# Patient Record
Sex: Female | Born: 1960 | Race: Black or African American | Hispanic: No | Marital: Married | Smoking: Never smoker
Health system: Southern US, Community
[De-identification: ages and names within clinical notes are randomized; demographics above are authoritative.]

## PROBLEM LIST (undated history)

## (undated) DIAGNOSIS — D702 Other drug-induced agranulocytosis: Secondary | ICD-10-CM

## (undated) DIAGNOSIS — C50919 Malignant neoplasm of unspecified site of unspecified female breast: Secondary | ICD-10-CM

## (undated) DIAGNOSIS — F32A Depression, unspecified: Secondary | ICD-10-CM

## (undated) DIAGNOSIS — J302 Other seasonal allergic rhinitis: Secondary | ICD-10-CM

## (undated) DIAGNOSIS — F419 Anxiety disorder, unspecified: Secondary | ICD-10-CM

## (undated) DIAGNOSIS — Z8669 Personal history of other diseases of the nervous system and sense organs: Secondary | ICD-10-CM

## (undated) DIAGNOSIS — IMO0001 Reserved for inherently not codable concepts without codable children: Secondary | ICD-10-CM

## (undated) DIAGNOSIS — Z923 Personal history of irradiation: Secondary | ICD-10-CM

## (undated) DIAGNOSIS — C801 Malignant (primary) neoplasm, unspecified: Secondary | ICD-10-CM

## (undated) DIAGNOSIS — F329 Major depressive disorder, single episode, unspecified: Secondary | ICD-10-CM

## (undated) DIAGNOSIS — IMO0002 Reserved for concepts with insufficient information to code with codable children: Secondary | ICD-10-CM

## (undated) DIAGNOSIS — D649 Anemia, unspecified: Secondary | ICD-10-CM

## (undated) DIAGNOSIS — Z9221 Personal history of antineoplastic chemotherapy: Secondary | ICD-10-CM

## (undated) DIAGNOSIS — I10 Essential (primary) hypertension: Secondary | ICD-10-CM

## (undated) DIAGNOSIS — G4700 Insomnia, unspecified: Secondary | ICD-10-CM

## (undated) HISTORY — DX: Anemia, unspecified: D64.9

## (undated) HISTORY — DX: Other drug-induced agranulocytosis: D70.2

## (undated) HISTORY — DX: Anxiety disorder, unspecified: F41.9

## (undated) HISTORY — DX: Major depressive disorder, single episode, unspecified: F32.9

## (undated) HISTORY — DX: Malignant neoplasm of unspecified site of unspecified female breast: C50.919

## (undated) HISTORY — DX: Essential (primary) hypertension: I10

## (undated) HISTORY — DX: Reserved for concepts with insufficient information to code with codable children: IMO0002

## (undated) HISTORY — DX: Other seasonal allergic rhinitis: J30.2

## (undated) HISTORY — DX: Reserved for inherently not codable concepts without codable children: IMO0001

## (undated) HISTORY — DX: Personal history of other diseases of the nervous system and sense organs: Z86.69

## (undated) HISTORY — DX: Depression, unspecified: F32.A

## (undated) HISTORY — DX: Insomnia, unspecified: G47.00

---

## 1997-08-16 ENCOUNTER — Inpatient Hospital Stay (HOSPITAL_COMMUNITY): Admission: AD | Admit: 1997-08-16 | Discharge: 1997-08-16 | Payer: Self-pay | Admitting: Obstetrics

## 1997-11-03 ENCOUNTER — Encounter: Admission: RE | Admit: 1997-11-03 | Discharge: 1997-11-03 | Payer: Self-pay | Admitting: Family Medicine

## 1997-11-22 ENCOUNTER — Encounter: Admission: RE | Admit: 1997-11-22 | Discharge: 1997-11-22 | Payer: Self-pay | Admitting: Family Medicine

## 1997-11-29 ENCOUNTER — Encounter: Admission: RE | Admit: 1997-11-29 | Discharge: 1997-11-29 | Payer: Self-pay | Admitting: Family Medicine

## 1997-12-03 ENCOUNTER — Inpatient Hospital Stay (HOSPITAL_COMMUNITY): Admission: AD | Admit: 1997-12-03 | Discharge: 1997-12-04 | Payer: Self-pay | Admitting: Obstetrics

## 1997-12-03 ENCOUNTER — Inpatient Hospital Stay (HOSPITAL_COMMUNITY): Admission: AD | Admit: 1997-12-03 | Discharge: 1997-12-03 | Payer: Self-pay | Admitting: *Deleted

## 1998-01-30 ENCOUNTER — Encounter: Admission: RE | Admit: 1998-01-30 | Discharge: 1998-01-30 | Payer: Self-pay | Admitting: Sports Medicine

## 1998-11-02 ENCOUNTER — Encounter: Admission: RE | Admit: 1998-11-02 | Discharge: 1998-11-02 | Payer: Self-pay | Admitting: Family Medicine

## 1998-11-23 ENCOUNTER — Encounter: Admission: RE | Admit: 1998-11-23 | Discharge: 1998-11-23 | Payer: Self-pay | Admitting: *Deleted

## 1999-02-26 ENCOUNTER — Encounter: Admission: RE | Admit: 1999-02-26 | Discharge: 1999-02-26 | Payer: Self-pay | Admitting: Family Medicine

## 1999-03-15 ENCOUNTER — Encounter: Admission: RE | Admit: 1999-03-15 | Discharge: 1999-03-15 | Payer: Self-pay | Admitting: Family Medicine

## 1999-06-11 ENCOUNTER — Encounter: Admission: RE | Admit: 1999-06-11 | Discharge: 1999-06-11 | Payer: Self-pay | Admitting: Family Medicine

## 2000-01-15 ENCOUNTER — Encounter: Admission: RE | Admit: 2000-01-15 | Discharge: 2000-01-15 | Payer: Self-pay | Admitting: Family Medicine

## 2000-07-03 ENCOUNTER — Encounter: Admission: RE | Admit: 2000-07-03 | Discharge: 2000-07-03 | Payer: Self-pay | Admitting: Family Medicine

## 2000-07-03 ENCOUNTER — Encounter: Admission: RE | Admit: 2000-07-03 | Discharge: 2000-07-03 | Payer: Self-pay | Admitting: *Deleted

## 2000-07-03 ENCOUNTER — Encounter: Payer: Self-pay | Admitting: Family Medicine

## 2000-08-04 ENCOUNTER — Encounter: Admission: RE | Admit: 2000-08-04 | Discharge: 2000-08-04 | Payer: Self-pay | Admitting: Family Medicine

## 2000-09-03 ENCOUNTER — Encounter: Admission: RE | Admit: 2000-09-03 | Discharge: 2000-09-03 | Payer: Self-pay | Admitting: Family Medicine

## 2000-09-15 ENCOUNTER — Encounter: Admission: RE | Admit: 2000-09-15 | Discharge: 2000-09-15 | Payer: Self-pay | Admitting: Family Medicine

## 2000-11-18 ENCOUNTER — Encounter: Admission: RE | Admit: 2000-11-18 | Discharge: 2000-11-18 | Payer: Self-pay | Admitting: Sports Medicine

## 2000-11-18 ENCOUNTER — Encounter: Admission: RE | Admit: 2000-11-18 | Discharge: 2000-11-18 | Payer: Self-pay | Admitting: Family Medicine

## 2000-11-18 ENCOUNTER — Encounter: Payer: Self-pay | Admitting: Sports Medicine

## 2000-11-25 ENCOUNTER — Encounter: Admission: RE | Admit: 2000-11-25 | Discharge: 2000-11-25 | Payer: Self-pay | Admitting: Family Medicine

## 2000-12-02 ENCOUNTER — Encounter: Admission: RE | Admit: 2000-12-02 | Discharge: 2000-12-02 | Payer: Self-pay | Admitting: Family Medicine

## 2000-12-02 ENCOUNTER — Encounter: Payer: Self-pay | Admitting: Family Medicine

## 2000-12-28 ENCOUNTER — Encounter: Admission: RE | Admit: 2000-12-28 | Discharge: 2000-12-28 | Payer: Self-pay | Admitting: Family Medicine

## 2001-05-20 ENCOUNTER — Encounter: Admission: RE | Admit: 2001-05-20 | Discharge: 2001-05-20 | Payer: Self-pay | Admitting: Family Medicine

## 2001-05-28 ENCOUNTER — Encounter: Admission: RE | Admit: 2001-05-28 | Discharge: 2001-05-28 | Payer: Self-pay | Admitting: Family Medicine

## 2001-07-14 ENCOUNTER — Encounter: Admission: RE | Admit: 2001-07-14 | Discharge: 2001-07-14 | Payer: Self-pay | Admitting: Family Medicine

## 2001-09-15 ENCOUNTER — Encounter: Admission: RE | Admit: 2001-09-15 | Discharge: 2001-09-15 | Payer: Self-pay | Admitting: Family Medicine

## 2002-01-05 ENCOUNTER — Encounter: Admission: RE | Admit: 2002-01-05 | Discharge: 2002-01-05 | Payer: Self-pay | Admitting: Family Medicine

## 2002-01-12 ENCOUNTER — Encounter: Admission: RE | Admit: 2002-01-12 | Discharge: 2002-01-12 | Payer: Self-pay | Admitting: Sports Medicine

## 2002-03-24 ENCOUNTER — Encounter: Admission: RE | Admit: 2002-03-24 | Discharge: 2002-03-24 | Payer: Self-pay | Admitting: Family Medicine

## 2002-03-30 ENCOUNTER — Encounter: Admission: RE | Admit: 2002-03-30 | Discharge: 2002-03-30 | Payer: Self-pay | Admitting: Family Medicine

## 2002-06-08 ENCOUNTER — Encounter: Admission: RE | Admit: 2002-06-08 | Discharge: 2002-06-08 | Payer: Self-pay | Admitting: Family Medicine

## 2002-07-13 ENCOUNTER — Encounter: Admission: RE | Admit: 2002-07-13 | Discharge: 2002-07-13 | Payer: Self-pay | Admitting: Family Medicine

## 2002-10-04 ENCOUNTER — Encounter: Admission: RE | Admit: 2002-10-04 | Discharge: 2002-10-04 | Payer: Self-pay | Admitting: Family Medicine

## 2002-12-21 ENCOUNTER — Encounter: Admission: RE | Admit: 2002-12-21 | Discharge: 2002-12-21 | Payer: Self-pay | Admitting: Family Medicine

## 2003-01-05 ENCOUNTER — Encounter: Payer: Self-pay | Admitting: Sports Medicine

## 2003-01-05 ENCOUNTER — Encounter: Admission: RE | Admit: 2003-01-05 | Discharge: 2003-01-05 | Payer: Self-pay | Admitting: Sports Medicine

## 2003-02-13 ENCOUNTER — Encounter: Admission: RE | Admit: 2003-02-13 | Discharge: 2003-02-13 | Payer: Self-pay | Admitting: Family Medicine

## 2003-04-06 ENCOUNTER — Encounter: Admission: RE | Admit: 2003-04-06 | Discharge: 2003-04-06 | Payer: Self-pay | Admitting: Family Medicine

## 2003-09-25 ENCOUNTER — Encounter: Admission: RE | Admit: 2003-09-25 | Discharge: 2003-09-25 | Payer: Self-pay | Admitting: Family Medicine

## 2003-10-02 ENCOUNTER — Encounter: Admission: RE | Admit: 2003-10-02 | Discharge: 2003-10-02 | Payer: Self-pay | Admitting: Family Medicine

## 2004-02-27 ENCOUNTER — Encounter: Admission: RE | Admit: 2004-02-27 | Discharge: 2004-02-27 | Payer: Self-pay | Admitting: Family Medicine

## 2004-05-16 ENCOUNTER — Encounter: Admission: RE | Admit: 2004-05-16 | Discharge: 2004-07-11 | Payer: Self-pay | Admitting: Nurse Practitioner

## 2004-10-24 ENCOUNTER — Ambulatory Visit: Payer: Self-pay | Admitting: Family Medicine

## 2004-11-09 ENCOUNTER — Encounter (INDEPENDENT_AMBULATORY_CARE_PROVIDER_SITE_OTHER): Payer: Self-pay | Admitting: *Deleted

## 2004-11-11 ENCOUNTER — Encounter: Admission: RE | Admit: 2004-11-11 | Discharge: 2004-11-11 | Payer: Self-pay | Admitting: Sports Medicine

## 2005-10-27 ENCOUNTER — Encounter (INDEPENDENT_AMBULATORY_CARE_PROVIDER_SITE_OTHER): Payer: Self-pay | Admitting: Specialist

## 2005-10-27 ENCOUNTER — Ambulatory Visit: Payer: Self-pay | Admitting: Family Medicine

## 2005-11-14 ENCOUNTER — Encounter: Admission: RE | Admit: 2005-11-14 | Discharge: 2005-11-14 | Payer: Self-pay | Admitting: Family Medicine

## 2006-07-22 ENCOUNTER — Ambulatory Visit: Payer: Self-pay | Admitting: Sports Medicine

## 2006-08-21 ENCOUNTER — Ambulatory Visit: Payer: Self-pay | Admitting: Family Medicine

## 2006-09-03 DIAGNOSIS — G43909 Migraine, unspecified, not intractable, without status migrainosus: Secondary | ICD-10-CM | POA: Insufficient documentation

## 2006-09-03 DIAGNOSIS — G4700 Insomnia, unspecified: Secondary | ICD-10-CM | POA: Insufficient documentation

## 2006-09-03 DIAGNOSIS — J309 Allergic rhinitis, unspecified: Secondary | ICD-10-CM | POA: Insufficient documentation

## 2006-09-04 ENCOUNTER — Encounter (INDEPENDENT_AMBULATORY_CARE_PROVIDER_SITE_OTHER): Payer: Self-pay | Admitting: *Deleted

## 2007-02-17 ENCOUNTER — Encounter: Admission: RE | Admit: 2007-02-17 | Discharge: 2007-02-17 | Payer: Self-pay | Admitting: Sports Medicine

## 2007-02-17 ENCOUNTER — Encounter (INDEPENDENT_AMBULATORY_CARE_PROVIDER_SITE_OTHER): Payer: Self-pay | Admitting: Family Medicine

## 2007-02-25 ENCOUNTER — Encounter (INDEPENDENT_AMBULATORY_CARE_PROVIDER_SITE_OTHER): Payer: Self-pay | Admitting: Family Medicine

## 2007-06-07 ENCOUNTER — Encounter (INDEPENDENT_AMBULATORY_CARE_PROVIDER_SITE_OTHER): Payer: Self-pay | Admitting: Family Medicine

## 2007-06-07 ENCOUNTER — Ambulatory Visit: Payer: Self-pay | Admitting: Family Medicine

## 2007-06-11 ENCOUNTER — Encounter (INDEPENDENT_AMBULATORY_CARE_PROVIDER_SITE_OTHER): Payer: Self-pay | Admitting: Family Medicine

## 2007-06-15 DIAGNOSIS — F32A Depression, unspecified: Secondary | ICD-10-CM | POA: Insufficient documentation

## 2007-06-15 DIAGNOSIS — I10 Essential (primary) hypertension: Secondary | ICD-10-CM | POA: Insufficient documentation

## 2007-06-15 DIAGNOSIS — F329 Major depressive disorder, single episode, unspecified: Secondary | ICD-10-CM

## 2007-06-15 DIAGNOSIS — F411 Generalized anxiety disorder: Secondary | ICD-10-CM | POA: Insufficient documentation

## 2007-07-08 HISTORY — PX: BUNIONECTOMY: SHX129

## 2007-07-28 ENCOUNTER — Encounter: Payer: Self-pay | Admitting: *Deleted

## 2008-07-26 ENCOUNTER — Ambulatory Visit: Payer: Self-pay | Admitting: Family Medicine

## 2008-07-26 ENCOUNTER — Encounter (INDEPENDENT_AMBULATORY_CARE_PROVIDER_SITE_OTHER): Payer: Self-pay | Admitting: Family Medicine

## 2008-07-26 DIAGNOSIS — H538 Other visual disturbances: Secondary | ICD-10-CM | POA: Insufficient documentation

## 2008-07-26 LAB — CONVERTED CEMR LAB
BUN: 15 mg/dL (ref 6–23)
Chloride: 100 meq/L (ref 96–112)
Cholesterol: 183 mg/dL (ref 0–200)
Glucose, Bld: 83 mg/dL (ref 70–99)
LDL Cholesterol: 92 mg/dL (ref 0–99)
Potassium: 3.8 meq/L (ref 3.5–5.3)
Sodium: 139 meq/L (ref 135–145)
Total CHOL/HDL Ratio: 2.4
VLDL: 14 mg/dL (ref 0–40)

## 2008-07-27 ENCOUNTER — Encounter (INDEPENDENT_AMBULATORY_CARE_PROVIDER_SITE_OTHER): Payer: Self-pay | Admitting: Family Medicine

## 2008-07-28 ENCOUNTER — Encounter (INDEPENDENT_AMBULATORY_CARE_PROVIDER_SITE_OTHER): Payer: Self-pay | Admitting: Family Medicine

## 2008-08-04 ENCOUNTER — Encounter: Admission: RE | Admit: 2008-08-04 | Discharge: 2008-08-04 | Payer: Self-pay | Admitting: Family Medicine

## 2008-10-27 ENCOUNTER — Telehealth (INDEPENDENT_AMBULATORY_CARE_PROVIDER_SITE_OTHER): Payer: Self-pay | Admitting: Family Medicine

## 2008-12-18 ENCOUNTER — Encounter (INDEPENDENT_AMBULATORY_CARE_PROVIDER_SITE_OTHER): Payer: Self-pay | Admitting: Family Medicine

## 2009-05-18 ENCOUNTER — Telehealth: Payer: Self-pay | Admitting: Family Medicine

## 2009-07-30 ENCOUNTER — Encounter: Payer: Self-pay | Admitting: Family Medicine

## 2009-07-30 ENCOUNTER — Ambulatory Visit: Payer: Self-pay | Admitting: Family Medicine

## 2009-07-30 DIAGNOSIS — R5381 Other malaise: Secondary | ICD-10-CM | POA: Insufficient documentation

## 2009-07-30 DIAGNOSIS — R5383 Other fatigue: Secondary | ICD-10-CM

## 2009-07-30 LAB — CONVERTED CEMR LAB: Pap Smear: NEGATIVE

## 2009-07-31 LAB — CONVERTED CEMR LAB
BUN: 12 mg/dL (ref 6–23)
Chloride: 98 meq/L (ref 96–112)
Cholesterol: 174 mg/dL (ref 0–200)
Creatinine, Ser: 0.77 mg/dL (ref 0.40–1.20)
HDL: 67 mg/dL (ref >39)
Potassium: 3.7 meq/L (ref 3.5–5.3)
Triglycerides: 62 mg/dL (ref <150)
VLDL: 12 mg/dL (ref 0–40)

## 2009-08-02 ENCOUNTER — Encounter: Payer: Self-pay | Admitting: Family Medicine

## 2009-08-02 DIAGNOSIS — E559 Vitamin D deficiency, unspecified: Secondary | ICD-10-CM | POA: Insufficient documentation

## 2009-08-06 ENCOUNTER — Encounter: Admission: RE | Admit: 2009-08-06 | Discharge: 2009-08-06 | Payer: Self-pay | Admitting: Family Medicine

## 2009-09-25 ENCOUNTER — Encounter: Payer: Self-pay | Admitting: Family Medicine

## 2009-09-25 ENCOUNTER — Ambulatory Visit: Payer: Self-pay | Admitting: Family Medicine

## 2009-09-25 LAB — CONVERTED CEMR LAB: Vit D, 25-Hydroxy: 31 ng/mL

## 2009-09-26 ENCOUNTER — Telehealth: Payer: Self-pay | Admitting: *Deleted

## 2010-07-26 ENCOUNTER — Ambulatory Visit: Admission: RE | Admit: 2010-07-26 | Discharge: 2010-07-26 | Payer: Self-pay | Source: Home / Self Care

## 2010-07-26 ENCOUNTER — Encounter: Payer: Self-pay | Admitting: Family Medicine

## 2010-07-27 ENCOUNTER — Other Ambulatory Visit: Payer: Self-pay | Admitting: Family Medicine

## 2010-07-27 DIAGNOSIS — Z0000 Encounter for general adult medical examination without abnormal findings: Secondary | ICD-10-CM

## 2010-07-27 LAB — CONVERTED CEMR LAB
Alkaline Phosphatase: 91 units/L (ref 39–117)
BUN: 12 mg/dL (ref 6–23)
Creatinine, Ser: 0.74 mg/dL (ref 0.40–1.20)
Glucose, Bld: 85 mg/dL (ref 70–99)
HCT: 39.9 % (ref 36.0–46.0)
Hemoglobin: 12.7 g/dL (ref 12.0–15.0)
LDL Cholesterol: 93 mg/dL (ref 0–99)
MCHC: 31.8 g/dL (ref 30.0–36.0)
MCV: 87.9 fL (ref 78.0–100.0)
Potassium: 3.8 meq/L (ref 3.5–5.3)
RBC: 4.54 M/uL (ref 3.87–5.11)
Total Bilirubin: 0.3 mg/dL (ref 0.3–1.2)
Total CHOL/HDL Ratio: 2.9
Total Protein: 6.7 g/dL (ref 6.0–8.3)
VLDL: 13 mg/dL (ref 0–40)
Vit D, 25-Hydroxy: 17 ng/mL — ABNORMAL LOW (ref 30–89)

## 2010-08-08 ENCOUNTER — Ambulatory Visit
Admission: RE | Admit: 2010-08-08 | Discharge: 2010-08-08 | Disposition: A | Discharge status: Home / Self Care | Payer: BC Managed Care – PPO | Source: Ambulatory Visit | Attending: *Deleted | Admitting: *Deleted

## 2010-08-08 DIAGNOSIS — Z0000 Encounter for general adult medical examination without abnormal findings: Secondary | ICD-10-CM
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
## 2010-08-08 NOTE — Progress Notes (Signed)
----   Converted from flag ---- ---- 09/26/2009 10:27 AM, Milinda Antis MD wrote: Please tell pt her Vit D level is 31, this is normal. Continue with the Vitamin D 1000 international Units daily like we discussed ------------------------------ called pt and delivered message. pt voices understanding.

## 2010-08-08 NOTE — Assessment & Plan Note (Signed)
Summary: FU BP/tcb   Vital Signs:  Patient profile:   50 year old female Weight:      150.5 pounds Pulse rate:   60 / minute BP sitting:   110 / 66  (left arm)  Vitals Entered By: Arlyss Repress CMA, (September 25, 2009 9:14 AM) CC: f/up HTN/ Vit D Is Patient Diabetic? No Pain Assessment Patient in pain? no        Primary Care Provider:  Milinda Antis MD  CC:  f/up HTN/ Vit D.  History of Present Illness:   Pt here to follow up on HTN  Last visit had episodes of lightheadness and fatigue- BP with systolic in low 100's, stopped HCTZ, BP today  110/66. Pt states she feels better off the second agent. No HA, no Chest pain, no dizziness  Vit D- Vit D deficient, will complete 50,000 units this sunday  Habits & Providers  Alcohol-Tobacco-Diet     Tobacco Status: never  Current Medications (verified): 1)  Inderal La 120 Mg Cp24 (Propranolol Hcl) .... Take 1 Capsule By Mouth Once A Day 2)  Wellbutrin Xl 150 Mg Tb24 (Bupropion Hcl) .... Take 1 Tablet By Mouth Once A Day 3)  Zyrtec Allergy 10 Mg Tabs (Cetirizine Hcl) .... Take 1 Tablet By Mouth At Bedtime 4)  Ambien 5 Mg  Tabs (Zolpidem Tartrate) .... Take One Tab By Mouth Nightly As Needed For Sleep 5)  Fluticasone Propionate 50 Mcg/act Susp (Fluticasone Propionate) .... 2 Puffs in Each Nostril Daily Disp: One 6)  Alprazolam 1 Mg Tabs (Alprazolam) .... Take 1/2 Tab By Mouth Two Times A Day As Needed Anxiety 7)  Ibuprofen 800 Mg Tabs (Ibuprofen) .Marland Kitchen.. 1 By Mouth Q 8hrs As Needed Pain 8)  Vitamin D (Ergocalciferol) 50000 Unit Caps (Ergocalciferol) .Marland Kitchen.. 1 By Mouth Q Weekly X 8 Weeks 9)  Vitamin D 1000 Unit Tabs (Cholecalciferol) .Marland Kitchen.. 1 By Mouth Daily  Allergies (verified): No Known Drug Allergies  Review of Systems       Per HPI  Physical Exam  General:  Well-developed,well-nourished,in no acute distress; alert,appropriate and cooperative throughout examination  Vital signs noted    Impression & Recommendations:  Problem  # 1:  HYPERTENSION (ICD-401.9) Assessment Improved  Will discontiue HCTZ continue inderal as will help with history of migraines for prophylaxis The following medications were removed from the medication list:    Hydrochlorothiazide 25 Mg Tabs (Hydrochlorothiazide) .Marland Kitchen... Take 1 tablet by mouth once a day Her updated medication list for this problem includes:    Inderal La 120 Mg Cp24 (Propranolol hcl) .Marland Kitchen... Take 1 capsule by mouth once a day  Orders: [facility]- Est Level  3 ([address])  Problem # 2:  VITAMIN D DEFICIENCY (ICD-268.9) Assessment: Unchanged Repeat labs, start Vit D 1000IU starting Monday Orders: Vit D, 25 OH-[facility] ([id number]) [facility]- Est Level  3 ([address])  Complete Medication List: 1)  Inderal La 120 Mg Cp24 (Propranolol hcl) .... Take 1 capsule by mouth once a day 2)  Wellbutrin Xl 150 Mg Tb24 (Bupropion hcl) .... Take 1 tablet by mouth once a day 3)  Zyrtec Allergy 10 Mg Tabs (Cetirizine hcl) .... Take 1 tablet by mouth at bedtime 4)  Ambien 5 Mg Tabs (Zolpidem tartrate) .... Take one tab by mouth nightly as needed for sleep 5)  Fluticasone Propionate 50 Mcg/act Susp (Fluticasone propionate) .... 2 puffs in each nostril daily disp: one 6)  Alprazolam 1 Mg Tabs (Alprazolam) .... Take 1/2 tab by mouth two times a day as  needed anxiety 7)  Ibuprofen 800 Mg Tabs (Ibuprofen) .Marland Kitchen.. 1 by mouth q 8hrs as needed pain 8)  Vitamin D (ergocalciferol) 50000 Unit Caps (Ergocalciferol) .Marland Kitchen.. 1 by mouth q weekly x 8 weeks 9)  Vitamin D 1000 Unit Tabs (Cholecalciferol) .Marland Kitchen.. 1 by mouth daily  Patient Instructions: 1)  Stop taking the HCTZ 2)  Monday start taking Vitamin D 1000IU daily 3)  Next follow-up will be 6 months Prescriptions: VITAMIN D 1000 UNIT TABS (CHOLECALCIFEROL) 1 by mouth daily  #30 x 12   Entered and Authorized by:   Milinda Antis MD   Signed by:   Milinda Antis MD on 09/25/2009   Method used:   Handwritten   RxID:   [id number]

## 2010-08-08 NOTE — Miscellaneous (Signed)
Summary: Vit D prescription  Clinical Lists Changes  Problems: Added new problem of VITAMIN D DEFICIENCY (ICD-268.9) Medications: Added new medication of VITAMIN D (ERGOCALCIFEROL) 50000 UNIT CAPS (ERGOCALCIFEROL) 1 by mouth Q weekly x 8 weeks - Signed Rx of VITAMIN D (ERGOCALCIFEROL) 50000 UNIT CAPS (ERGOCALCIFEROL) 1 by mouth Q weekly x 8 weeks;  #8 x 0;  Signed;  Entered by: Milinda Antis MD;  Authorized by: Milinda Antis MD;  Method used: [facility], [place], [place]  [address], Ph: [phone] or [phone], Fax: [phone]    Prescriptions: VITAMIN D (ERGOCALCIFEROL) 50000 UNIT CAPS (ERGOCALCIFEROL) 1 by mouth Q weekly x 8 weeks  #8 x 0   Entered and Authorized by:   Milinda Antis MD   Signed by:   Milinda Antis MD on 08/02/2009   Method used:   [facility] to        [facility] (retail)       [address]       [place], [place]  [address]       Ph: [phone] or [phone]       Fax: [phone]   RxID:   [id number]    Discussed lab results , and Vit D. Start 50, 000 units q weekly, then 1000IU daily Milinda Antis MD  August 02, 2009 1:30 PM

## 2010-08-16 ENCOUNTER — Ambulatory Visit (INDEPENDENT_AMBULATORY_CARE_PROVIDER_SITE_OTHER): Payer: BC Managed Care – PPO | Admitting: Family Medicine

## 2010-08-16 ENCOUNTER — Encounter: Payer: Self-pay | Admitting: Family Medicine

## 2010-08-16 VITALS — BP 121/77 | HR 73 | Temp 98.5°F | Wt 157.0 lb

## 2010-08-16 DIAGNOSIS — F329 Major depressive disorder, single episode, unspecified: Secondary | ICD-10-CM

## 2010-08-16 DIAGNOSIS — Z23 Encounter for immunization: Secondary | ICD-10-CM

## 2010-08-16 DIAGNOSIS — Z124 Encounter for screening for malignant neoplasm of cervix: Secondary | ICD-10-CM

## 2010-08-16 DIAGNOSIS — E559 Vitamin D deficiency, unspecified: Secondary | ICD-10-CM

## 2010-08-16 DIAGNOSIS — F3289 Other specified depressive episodes: Secondary | ICD-10-CM

## 2010-08-16 MED ORDER — ERGOCALCIFEROL 1.25 MG (50000 UT) PO CAPS: 50000.0000 [IU] | ORAL_CAPSULE | ORAL | Status: DC

## 2010-08-16 MED ORDER — TETANUS-DIPHTH-ACELL PERTUSSIS 5-2-15.5 LF-MCG/0.5 IM SUSP
0.5000 mL | Freq: Once | INTRAMUSCULAR | Status: AC
  Administered 2010-08-16: 0.5 mL via INTRAMUSCULAR

## 2010-08-16 NOTE — Patient Instructions (Signed)
Make follow-up appt in 2 months to follow-up your mood  Start the Vit D 50,000 units -1 pill each week for 8 weeks Then start taking the 1000units- Vitamin D daily Your blood work otherwise was normal- See copy of labs given

## 2010-08-16 NOTE — Progress Notes (Signed)
  Subjective:    Patient ID: Alekhya Gravlin, female    DOB: Aug 03, 1960, 50 y.o.   MRN: [id number]  HPI  PAP Smear- Here for PAP Smear, no abnormal PAP smears, no vaginal discharge or spotting, Has Mammogram last week          Depression- taking Wellbutrin 300mg  XL daily, increased at last visit, feels it has helped with her irritability, no change in crying episodes, but has not had any recently. Appetite okay, sleep unchanged.   Reviewed labs  Review of Systems per ABOVE     Objective:   Physical Exam  Genitourinary: Uterus normal. Cervix exhibits no motion tenderness, no discharge and no friability. Right adnexum displays no mass and no tenderness. Left adnexum displays no mass and no tenderness. No erythema, tenderness or bleeding around the vagina. No vaginal discharge found.   Gen- NAD, alert and oriented, vitals noted Psych- flat affect, but does laugh and smile at appropriate times, no apparent hallucinations, does not appear to be anxious        Assessment & Plan:  Tdap needed, declines flu

## 2010-08-16 NOTE — Assessment & Plan Note (Signed)
PAP Smear done

## 2010-08-16 NOTE — Assessment & Plan Note (Signed)
Improved, 3 weeks since dose change, no red flags, continue Pt declines couseling

## 2010-08-16 NOTE — Assessment & Plan Note (Signed)
Start Vit d replacement, see instructions, 50,000 IU x 8 weeks Encouraged more dairy

## 2010-08-19 ENCOUNTER — Encounter: Payer: Self-pay | Admitting: Family Medicine

## 2010-09-08 ENCOUNTER — Other Ambulatory Visit: Payer: Self-pay | Admitting: Family Medicine

## 2010-09-09 NOTE — Telephone Encounter (Signed)
Refill request

## 2010-10-01 ENCOUNTER — Other Ambulatory Visit: Payer: Self-pay | Admitting: Family Medicine

## 2010-10-01 NOTE — Telephone Encounter (Signed)
Refill request

## 2010-10-25 ENCOUNTER — Other Ambulatory Visit: Payer: Self-pay | Admitting: Family Medicine

## 2010-10-25 DIAGNOSIS — Z0000 Encounter for general adult medical examination without abnormal findings: Secondary | ICD-10-CM

## 2010-12-28 ENCOUNTER — Other Ambulatory Visit: Payer: Self-pay | Admitting: Family Medicine

## 2010-12-29 NOTE — Telephone Encounter (Signed)
Refill request

## 2011-01-23 ENCOUNTER — Other Ambulatory Visit: Payer: Self-pay | Admitting: Family Medicine

## 2011-01-23 NOTE — Telephone Encounter (Signed)
Refill request

## 2011-02-24 ENCOUNTER — Other Ambulatory Visit: Payer: Self-pay | Admitting: Family Medicine

## 2011-02-24 MED ORDER — ZOLPIDEM TARTRATE 5 MG PO TABS: 5.0000 mg | ORAL_TABLET | Freq: Every evening | ORAL | Status: DC | PRN

## 2011-05-20 ENCOUNTER — Other Ambulatory Visit: Payer: Self-pay | Admitting: Family Medicine

## 2011-05-20 MED ORDER — BUPROPION HCL ER (XL) 300 MG PO TB24: 300.0000 mg | ORAL_TABLET | ORAL | Status: DC

## 2011-06-16 ENCOUNTER — Ambulatory Visit (INDEPENDENT_AMBULATORY_CARE_PROVIDER_SITE_OTHER): Payer: BC Managed Care – PPO | Admitting: Family Medicine

## 2011-06-16 ENCOUNTER — Encounter: Payer: Self-pay | Admitting: Family Medicine

## 2011-06-16 DIAGNOSIS — I10 Essential (primary) hypertension: Secondary | ICD-10-CM

## 2011-06-16 DIAGNOSIS — Z0000 Encounter for general adult medical examination without abnormal findings: Secondary | ICD-10-CM

## 2011-06-16 DIAGNOSIS — E663 Overweight: Secondary | ICD-10-CM

## 2011-06-16 LAB — CBC
Hemoglobin: 13.5 g/dL (ref 12.0–15.0)
MCH: 28 pg (ref 26.0–34.0)
MCHC: 31.9 g/dL (ref 30.0–36.0)
MCV: 87.8 fL (ref 78.0–100.0)
Platelets: 313 10*3/uL (ref 150–400)

## 2011-06-16 MED ORDER — PROPRANOLOL HCL ER 120 MG PO CP24: 120.0000 mg | ORAL_CAPSULE | Freq: Every day | ORAL | Status: DC

## 2011-06-17 LAB — COMPREHENSIVE METABOLIC PANEL
AST: 13 U/L (ref 0–37)
Albumin: 4.1 g/dL (ref 3.5–5.2)
Alkaline Phosphatase: 78 U/L (ref 39–117)
Potassium: 4.1 mEq/L (ref 3.5–5.3)
Sodium: 138 mEq/L (ref 135–145)
Total Bilirubin: 0.5 mg/dL (ref 0.3–1.2)
Total Protein: 6.6 g/dL (ref 6.0–8.3)

## 2011-06-17 LAB — LIPID PANEL
Cholesterol: 149 mg/dL (ref 0–200)
VLDL: 13 mg/dL (ref 0–40)

## 2011-06-18 ENCOUNTER — Encounter: Payer: Self-pay | Admitting: Family Medicine

## 2011-06-18 NOTE — Progress Notes (Signed)
SUBJECTIVE:  50 y.o. female for annual routine checkup and medication refill.   -Concerns today include occasional numbness in R arm.  This comes and goes, and is worsened if she lays on that side. No loss of grip strength.  She does have mild neck pain and tightness associated with these symptoms.   Current Outpatient Prescriptions  Medication Sig Dispense Refill  . ALPRAZolam (XANAX) 1 MG tablet take 1/2 tab by mouth two times a day as needed anxiety       . buPROPion (WELLBUTRIN XL) 300 MG 24 hr tablet Take 1 tablet (300 mg total) by mouth every morning. Needs appt. Prior to next refill  30 tablet  1  . cetirizine (ZYRTEC) 10 MG tablet Take 10 mg by mouth at bedtime.        . cholecalciferol (VITAMIN D) 1000 UNIT tablet Take 1,000 Units by mouth daily.        . fluticasone (FLONASE) 50 MCG/ACT nasal spray 2 sprays by Nasal route daily.        Marland Kitchen ibuprofen (ADVIL,MOTRIN) 800 MG tablet TAKE 1 TABLET BY MOUTH EVERY 8 HOURS AS NEEDED FOR PAIN  40 tablet  2  . propranolol (INDERAL LA) 120 MG 24 hr capsule Take 1 capsule (120 mg total) by mouth daily.  93 capsule  1  . zolpidem (AMBIEN) 5 MG tablet Take 1 tablet (5 mg total) by mouth at bedtime as needed.  30 tablet  1   Allergies: Review of patient's allergies indicates no known allergies.  Patient's last menstrual period was 05/17/2011.    ROS:  Feeling well. No dyspnea or chest pain on exertion.  No abdominal pain, change in bowel habits, black or bloody stools.  No urinary tract symptoms. GYN ROS: normal menses, no abnormal bleeding, pelvic pain or discharge, no breast pain or new or enlarging lumps on self exam.  OBJECTIVE:  The patient appears well, alert, oriented x 3, in no distress. BP 125/85  Pulse 74  Ht 5\' 5"  (1.651 m)  Wt 155 lb 14.4 oz (70.716 kg)  BMI 25.94 kg/m2  LMP 05/17/2011 ENT normal.  Neck supple. No adenopathy or thyromegaly. PERLA.  Lungs are clear, good air entry, no wheezes, rhonchi or rales.  Heart with S1 and S2  normal, no murmurs, regular rate and rhythm.  Abdomen soft without tenderness, guarding, mass or organomegaly.  Extremities show no edema, normal peripheral pulses.  Neurological is normal, no focal findings.   Neck: Negative spurling's Full neck range of motion Grip strength and sensation normal in bilateral hands Strength good C4 to T1 distribution No sensory change to C4 to T1 Reflexes normal Negative tinels at cubital, carpal tunnels and guyon's   PELVIC EXAM: Not indicated, last three consecutive paps normal   ASSESSMENT:  well woman No red flags on exam regarding arm numbness.  May be from compression due to her laying on arm vs early cervical radiculopathy. PLAN:  additional lab tests per orders return annually or prn She wishes to just watch arm numbness, not botherng her too bad, will return if worsening

## 2011-06-19 ENCOUNTER — Other Ambulatory Visit: Payer: Self-pay | Admitting: Family Medicine

## 2011-06-19 NOTE — Telephone Encounter (Signed)
Refill request

## 2011-06-26 ENCOUNTER — Other Ambulatory Visit: Payer: Self-pay | Admitting: Family Medicine

## 2011-06-26 MED ORDER — ZOLPIDEM TARTRATE 5 MG PO TABS: 5.0000 mg | ORAL_TABLET | Freq: Every evening | ORAL | Status: DC | PRN

## 2011-07-16 ENCOUNTER — Other Ambulatory Visit: Payer: Self-pay | Admitting: Family Medicine

## 2011-07-16 MED ORDER — BUPROPION HCL ER (XL) 300 MG PO TB24: 300.0000 mg | ORAL_TABLET | ORAL | Status: DC

## 2011-09-04 ENCOUNTER — Other Ambulatory Visit: Payer: Self-pay | Admitting: Family Medicine

## 2011-09-04 DIAGNOSIS — Z1231 Encounter for screening mammogram for malignant neoplasm of breast: Secondary | ICD-10-CM

## 2011-09-28 ENCOUNTER — Other Ambulatory Visit: Payer: Self-pay | Admitting: Family Medicine

## 2011-09-29 ENCOUNTER — Ambulatory Visit
Admission: RE | Admit: 2011-09-29 | Discharge: 2011-09-29 | Disposition: A | Discharge status: Home / Self Care | Payer: BC Managed Care – PPO | Source: Ambulatory Visit | Attending: Family Medicine | Admitting: Family Medicine

## 2011-09-29 DIAGNOSIS — Z1231 Encounter for screening mammogram for malignant neoplasm of breast: Secondary | ICD-10-CM

## 2011-11-03 ENCOUNTER — Other Ambulatory Visit: Payer: Self-pay | Admitting: Family Medicine

## 2011-12-02 ENCOUNTER — Other Ambulatory Visit: Payer: Self-pay | Admitting: *Deleted

## 2011-12-02 MED ORDER — PROPRANOLOL HCL ER 120 MG PO CP24: 120.0000 mg | ORAL_CAPSULE | Freq: Every day | ORAL | Status: DC

## 2012-01-12 ENCOUNTER — Other Ambulatory Visit: Payer: Self-pay | Admitting: Family Medicine

## 2012-01-12 MED ORDER — ZOLPIDEM TARTRATE 5 MG PO TABS: 5.0000 mg | ORAL_TABLET | Freq: Every evening | ORAL | Status: DC | PRN

## 2012-01-22 ENCOUNTER — Telehealth: Payer: Self-pay | Admitting: Family Medicine

## 2012-01-22 NOTE — Telephone Encounter (Signed)
Medical Exam Report to be completed by Ashley Royalty.

## 2012-01-27 NOTE — Telephone Encounter (Signed)
Needs multiple things including vision, hearing, UA, PPD.  Will need appt for this. Forms returned to Terese Door to call patient

## 2012-01-28 NOTE — Telephone Encounter (Signed)
Pt scheduled for 7/29 for provider to complete forms

## 2012-02-02 ENCOUNTER — Ambulatory Visit (INDEPENDENT_AMBULATORY_CARE_PROVIDER_SITE_OTHER): Payer: BC Managed Care – PPO | Admitting: Family Medicine

## 2012-02-02 ENCOUNTER — Encounter: Payer: Self-pay | Admitting: Family Medicine

## 2012-02-02 VITALS — BP 132/88 | HR 58 | Ht 65.0 in | Wt 163.0 lb

## 2012-02-02 DIAGNOSIS — Z111 Encounter for screening for respiratory tuberculosis: Secondary | ICD-10-CM

## 2012-02-02 DIAGNOSIS — Z0000 Encounter for general adult medical examination without abnormal findings: Secondary | ICD-10-CM

## 2012-02-02 LAB — POCT URINALYSIS DIPSTICK
Glucose, UA: NEGATIVE
Leukocytes, UA: NEGATIVE
Protein, UA: NEGATIVE
Spec Grav, UA: 1.01
Urobilinogen, UA: 0.2

## 2012-02-02 LAB — POCT UA - MICROSCOPIC ONLY

## 2012-02-02 NOTE — Progress Notes (Signed)
Patient ID: Ana Young, female   DOB: [birth year], 51 y.o.   MRN: [id number] SUBJECTIVE:  51 y.o. female for annual checkup.  She requires additional testing including UA, PPD,  Vision and hearing screening for a job she is applying for in [employer].   Current Outpatient Prescriptions  Medication Sig Dispense Refill  . buPROPion (WELLBUTRIN XL) 300 MG 24 hr tablet TAKE 1 TABLET (300 MG TOTAL) BY MOUTH EVERY MORNING.  30 tablet  3  . ibuprofen (ADVIL,MOTRIN) 800 MG tablet TAKE 1 TABLET BY MOUTH EVERY 8 HOURS AS NEEDED FOR PAIN  40 tablet  2  . loratadine (CLARITIN) 10 MG tablet Take 10 mg by mouth daily.      . propranolol ER (INDERAL LA) 120 MG 24 hr capsule Take 1 capsule (120 mg total) by mouth daily.  93 capsule  1  . zolpidem (AMBIEN) 5 MG tablet Take 1 tablet (5 mg total) by mouth at bedtime as needed for sleep.  30 tablet  4  . ergocalciferol (VITAMIN D2) 50000 UNITS capsule Take 1 capsule (50,000 Units total) by mouth once a week. x 8 weeks  8 capsule  0   Allergies: Review of patient's allergies indicates no known allergies.  No LMP recorded.  ROS:  Feeling well. No dyspnea or chest pain on exertion.  No abdominal pain, change in bowel habits, black or bloody stools.  No urinary tract symptoms. GYN ROS: normal menses, no abnormal bleeding, pelvic pain or discharge, no breast pain or new or enlarging lumps on self exam. No neurological complaints.  OBJECTIVE:  The patient appears well, alert, oriented x 3, in no distress. BP 132/88  Pulse 58  Ht 5\' 5"  (1.651 m)  Wt 163 lb (73.936 kg)  BMI 27.12 kg/m2 ENT normal.  Neck supple. No adenopathy or thyromegaly. PERLA.  Peripheral vision normal  Lungs: are clear, good air entry, no wheezes, rhonchi or rales. CV: S1 and S2 normal, no murmurs, regular rate and rhythm.  Abdomen: soft without tenderness, guarding, mass or organomegaly.  Extremities: show no edema, normal peripheral pulses.  Neurological: is normal, no focal  findings.  Color testing: Normal   ASSESSMENT:  well woman  PLAN:  additional lab tests per orders return annually or prn  I find no reason she can not participate in training for [employer].  Paperwork completed.  She will return to have PPD read.

## 2012-02-04 ENCOUNTER — Ambulatory Visit (INDEPENDENT_AMBULATORY_CARE_PROVIDER_SITE_OTHER): Payer: BC Managed Care – PPO | Admitting: *Deleted

## 2012-02-04 DIAGNOSIS — IMO0001 Reserved for inherently not codable concepts without codable children: Secondary | ICD-10-CM

## 2012-02-04 DIAGNOSIS — Z111 Encounter for screening for respiratory tuberculosis: Secondary | ICD-10-CM

## 2012-02-04 LAB — TB SKIN TEST
Induration: 0 mm
TB Skin Test: NEGATIVE

## 2012-02-24 ENCOUNTER — Other Ambulatory Visit: Payer: Self-pay | Admitting: Family Medicine

## 2012-05-13 ENCOUNTER — Other Ambulatory Visit: Payer: Self-pay | Admitting: *Deleted

## 2012-05-13 MED ORDER — PROPRANOLOL HCL ER 120 MG PO CP24: 120.0000 mg | ORAL_CAPSULE | Freq: Every day | ORAL | Status: DC

## 2012-05-17 ENCOUNTER — Other Ambulatory Visit: Payer: Self-pay | Admitting: *Deleted

## 2012-05-17 MED ORDER — IBUPROFEN 800 MG PO TABS: 800.0000 mg | ORAL_TABLET | Freq: Three times a day (TID) | ORAL | Status: DC | PRN

## 2012-06-13 ENCOUNTER — Other Ambulatory Visit: Payer: Self-pay | Admitting: Family Medicine

## 2012-07-07 DIAGNOSIS — C801 Malignant (primary) neoplasm, unspecified: Secondary | ICD-10-CM

## 2012-07-07 HISTORY — DX: Malignant (primary) neoplasm, unspecified: C80.1

## 2012-07-20 ENCOUNTER — Other Ambulatory Visit: Payer: Self-pay | Admitting: Family Medicine

## 2012-08-19 ENCOUNTER — Other Ambulatory Visit: Payer: Self-pay | Admitting: Family Medicine

## 2012-10-20 ENCOUNTER — Telehealth: Payer: Self-pay | Admitting: Family Medicine

## 2012-10-20 NOTE — Telephone Encounter (Signed)
Pt's husband dropped off paperwork to be filled out regarding employment.

## 2012-10-21 NOTE — Telephone Encounter (Signed)
Called pt. Advised that Dr.Matthews does have the form, but it will take at least 7 days, can take up to 14 days. We will call her, once it is done. She agreed. Will call her, once we have the paperwork .Ana Young

## 2012-10-21 NOTE — Telephone Encounter (Signed)
Pt is asking about status of her paperwork - pls advise

## 2012-10-26 NOTE — Telephone Encounter (Signed)
Left message at [phone] that forms for employment are completed and ready to be picked up at front desk.  Ileana Ladd

## 2012-10-26 NOTE — Telephone Encounter (Signed)
Patient has called back to check the status of the paperwork.

## 2012-11-25 ENCOUNTER — Other Ambulatory Visit: Payer: Self-pay | Admitting: Family Medicine

## 2012-11-30 ENCOUNTER — Other Ambulatory Visit: Payer: Self-pay | Admitting: Family Medicine

## 2012-12-26 ENCOUNTER — Other Ambulatory Visit: Payer: Self-pay | Admitting: Family Medicine

## 2013-01-03 ENCOUNTER — Other Ambulatory Visit: Payer: Self-pay | Admitting: *Deleted

## 2013-01-03 MED ORDER — ZOLPIDEM TARTRATE 5 MG PO TABS: ORAL_TABLET | ORAL | Status: DC

## 2013-01-03 NOTE — Telephone Encounter (Signed)
No further refills until seen.

## 2013-01-17 ENCOUNTER — Other Ambulatory Visit: Payer: Self-pay

## 2013-01-18 ENCOUNTER — Ambulatory Visit (INDEPENDENT_AMBULATORY_CARE_PROVIDER_SITE_OTHER): Payer: BC Managed Care – PPO | Admitting: Family Medicine

## 2013-01-18 ENCOUNTER — Other Ambulatory Visit: Payer: Self-pay | Admitting: Family Medicine

## 2013-01-18 ENCOUNTER — Encounter: Payer: Self-pay | Admitting: Family Medicine

## 2013-01-18 VITALS — BP 148/92 | HR 60 | Temp 97.9°F | Ht 65.0 in | Wt 151.5 lb

## 2013-01-18 DIAGNOSIS — N6489 Other specified disorders of breast: Secondary | ICD-10-CM

## 2013-01-18 DIAGNOSIS — N63 Unspecified lump in unspecified breast: Secondary | ICD-10-CM

## 2013-01-18 DIAGNOSIS — H538 Other visual disturbances: Secondary | ICD-10-CM

## 2013-01-18 NOTE — Patient Instructions (Addendum)
It was a pleasure to see you today.  I am ordering a diagnostic mammogram of the left breast to evaluate the lump.  I recommend seeing an eye care professional to help [place] understand the cause of the glare in the left eye.  Your vision today was fine (20/20 in both eyes).

## 2013-01-18 NOTE — Progress Notes (Signed)
Subjective:     Patient ID: Ana Young, female   DOB: 19-Dec-1960, 52 y.o.   MRN: [id number]  HPI This is an African American 52 yo woman with hx of HTN and fam hx of breast cancer presenting to the clinic today for a new left breast lump and left eye irritation.  Pt found a lump in the LUQ of her left breast this past Saturday (January 15 2013) and wanted to get it examined. It has not been bothering her at all, nor does it affect any of her daily activities. She is able to move the mass slightly. She denies pain, nipple discharge, or any recent illness or sick contacts. She has a significant fam hx for breast cancer only in her maternal grandmother. She had a normal screening mammogram last Spring. Her LMP was last month, however, they have always been irregular. Pt does not believe that there is a possibility that she is pregnant.    Ana Young also complains of sharp left eye pain that she describes as "someone sticking a pin in your eye." She localizes it to the medial part of her eye. This began yesterday, occurring two or three times and lasting only for seconds. She has tried OTC eye drops (unsure of which but stated that they may be for allergies), which provided no relief and actually caused her eyes to burn. Currently, she states that her eyes feel tight. She denies photophobia, sensation of foreign object in eye, HA, N/V, seeing lights or objects in her field of vision. She does endorse a type of glare that she is experiencing in her left eye, but otherwise has had no change in vision. No pain with eye movement.  Review of Systems As per HPI  Attending Note;  Patient seen and examined by me together with Donnal Moat, [facility], I have reviewed and agree with his documentation in this note.  Briefly, Ana Young comes in today for an acute visit after noticing a mass along the lateral aspect of her left breast over this past weekend.  She reports that it is tender when she presses on it, but  otherwise it is asymptomatic.  She receives annual mammograms and the (negative) results of her most recent screening mammography are reviewed with her today.  She denies nipple discharge or blood, no overlying skin changes on either breast, no masses along axillae.  No fevers or chills, no weight changes or sweats.  Her LMP was about 1 month ago.  Family Hx significant for maternal grandmother with breast cancer.  Patient has no sisters.   Patient also reports that she has had some sharp momentary pain in the left eye, without associated headache.  She has noticed a glare in the L eye, no associated lights or flashes of light.  No nausea or vomiting.  No amaurosis fugax. No jaw claudication.  Does not feel like a foreign body in eye. No diplopia or increase in lacrimation.  She has her eyes checked by an eye care professional on [facility] close to [facility]. JB    Objective:   Physical Exam General: NAD. A&Ox3. Speaks in full sentences and answers questions appropriately.  Well appearing, no apparent distress. JB  Head & Neck: NCAT. No cervical lymphadenopathy. Neck supple, no cervical adenopathy. TMs clear bilaterally. No frontal or maxillary sinus tenderness. Snellen visual acuity 20/20 uncorrected OS, OD, and OU.  PERRLA.  EOMI.  No conjunctival injection bilat. Funduscopic exam with crisp disc visualized in OD; opacity that  obscures visualization of fundus on the left. JB  Eye: Visual acuity on Snellen Chart 20/20 uncorrected. No scleral icterus or conjunctival pallor. Some scleral inflammation L>R. PERRL. On fundoscopic exam, no retinopathy or papilledema noted. However, fundus on left was difficult to visualize.  Breast: No nipple discharge, no pain on palpation bilaterally. Painless, non-mobile, firm mass noted in LUQ of left breast. No axillary lymphadenopathy.  No breast asymmetry.  Firm, palpable subcutaneous mass on L breast, Upper-outer quadrant, measuring >1cm diameter.  No nipple discharge or  skin changes on either breast.  No axillary or supraclavicular adenopathy noted on bilateral exam.  JB  Problem list: 1. Breast mass 2. Eye irritation/pain    Assessment:     This is a 52 yo AA woman with HTN and fam hx of breast cancer with a left breast mass concerning for neoplasm and left eye pain that may be suggestive of possible cataracts or glaucoma.    Plan:     1. Breast mass - Given family hx of breast cancer and painless, non-mobile presentation of the left breast mass further evaluation is necessary. Pt was scheduled for diagnostic mammography and will be called with the results when they return. Patient with finding of L breast mass on SBE, confirmed on CBE today.  For diagnostic mammography and possibly [place] at the discretion of the radiologist at the [facility].  Patient is informed that she will be called once the results of the study becomes available. JB   2. Eye pain - "Glare" and acute onset sharp pain lasting seconds w/o other symptoms or changes in vision is nonspecific but concerning for cataracts or glaucoma. Particularly, pt's race and hx HTN puts her at risk for glaucoma. It was recommended to the pt to see an eye care professional for further evaluation.  No acute vision loss; opacity in L eye raises cataract on list of differential dx. For exam by her eyecare professional, she will schedule. JB

## 2013-01-19 NOTE — Assessment & Plan Note (Signed)
No acute vision loss; opacity in L eye raises cataract on list of differential dx. For exam by her eyecare professional, she will schedule. JB

## 2013-01-19 NOTE — Assessment & Plan Note (Signed)
Patient with finding of L breast mass on SBE, confirmed on CBE today.  For diagnostic mammography and possibly [place] at the discretion of the radiologist at the [facility].  Patient is informed that she will be called once the results of the study becomes available. JB

## 2013-01-27 ENCOUNTER — Ambulatory Visit
Admission: RE | Admit: 2013-01-27 | Discharge: 2013-01-27 | Disposition: A | Discharge status: Home / Self Care | Payer: BC Managed Care – PPO | Source: Ambulatory Visit | Attending: Family Medicine | Admitting: Family Medicine

## 2013-01-27 ENCOUNTER — Other Ambulatory Visit: Payer: Self-pay | Admitting: Family Medicine

## 2013-01-27 DIAGNOSIS — N63 Unspecified lump in unspecified breast: Secondary | ICD-10-CM

## 2013-01-28 ENCOUNTER — Telehealth: Payer: Self-pay | Admitting: Family Medicine

## 2013-01-28 NOTE — Telephone Encounter (Signed)
I called patient on her mobile phone, to discuss the findings of her recent diagnostic mammogram and [place].  She is aware of the results and has [facility] for [facility]th at 1pm. Paula Compton, MD

## 2013-02-21 ENCOUNTER — Other Ambulatory Visit: Payer: BC Managed Care – PPO

## 2013-02-22 ENCOUNTER — Other Ambulatory Visit: Payer: Self-pay | Admitting: Family Medicine

## 2013-02-22 ENCOUNTER — Ambulatory Visit
Admission: RE | Admit: 2013-02-22 | Discharge: 2013-02-22 | Disposition: A | Discharge status: Home / Self Care | Payer: BC Managed Care – PPO | Source: Ambulatory Visit | Attending: Family Medicine | Admitting: Family Medicine

## 2013-02-22 DIAGNOSIS — N63 Unspecified lump in unspecified breast: Secondary | ICD-10-CM

## 2013-02-23 ENCOUNTER — Ambulatory Visit
Admission: RE | Admit: 2013-02-23 | Discharge: 2013-02-23 | Disposition: A | Discharge status: Home / Self Care | Payer: BC Managed Care – PPO | Source: Ambulatory Visit | Attending: Family Medicine | Admitting: Family Medicine

## 2013-02-23 ENCOUNTER — Other Ambulatory Visit: Payer: Self-pay | Admitting: Family Medicine

## 2013-02-23 ENCOUNTER — Telehealth: Payer: Self-pay | Admitting: *Deleted

## 2013-02-23 DIAGNOSIS — N63 Unspecified lump in unspecified breast: Secondary | ICD-10-CM

## 2013-02-23 DIAGNOSIS — C50919 Malignant neoplasm of unspecified site of unspecified female breast: Secondary | ICD-10-CM

## 2013-02-23 NOTE — Telephone Encounter (Signed)
Olegario Messier calling from [facility].  Patient recently dx'd with left breast CA. Order has been put in for breast MRI and appt scheduled with [facility] for 02/28/13.  [facility] will fax orders to our office for signature.  Gaylene Brooks, RN

## 2013-02-23 NOTE — Telephone Encounter (Signed)
Reviewed recent clinic notes from Dr. Mauricio Po. I have not yet met Ms [name] but appreciate the efforts of other providers in diagnosing and helping arrange further work-up and treatment for her. Orders form signed and given to. Ander Slade, RN for faxing back to [facility]. Thank you! --CMS

## 2013-02-24 ENCOUNTER — Telehealth: Payer: Self-pay | Admitting: *Deleted

## 2013-02-24 ENCOUNTER — Other Ambulatory Visit: Payer: Self-pay | Admitting: *Deleted

## 2013-02-24 DIAGNOSIS — C50412 Malignant neoplasm of upper-outer quadrant of left female breast: Secondary | ICD-10-CM

## 2013-02-24 NOTE — Telephone Encounter (Signed)
Called and spoke with patient and confirmed [facility] appt. For 03/02/13 at 0800.  Instructions and contact information given.

## 2013-02-28 ENCOUNTER — Ambulatory Visit
Admission: RE | Admit: 2013-02-28 | Discharge: 2013-02-28 | Disposition: A | Discharge status: Home / Self Care | Payer: BC Managed Care – PPO | Source: Ambulatory Visit | Attending: Family Medicine | Admitting: Family Medicine

## 2013-02-28 DIAGNOSIS — C50919 Malignant neoplasm of unspecified site of unspecified female breast: Secondary | ICD-10-CM

## 2013-02-28 MED ORDER — GADOBENATE DIMEGLUMINE 529 MG/ML IV SOLN
14.0000 mL | Freq: Once | INTRAVENOUS | Status: AC | PRN
  Administered 2013-02-28: 14 mL via INTRAVENOUS

## 2013-03-01 ENCOUNTER — Other Ambulatory Visit: Payer: Self-pay | Admitting: Family Medicine

## 2013-03-01 DIAGNOSIS — R928 Other abnormal and inconclusive findings on diagnostic imaging of breast: Secondary | ICD-10-CM

## 2013-03-02 ENCOUNTER — Encounter: Payer: Self-pay | Admitting: *Deleted

## 2013-03-02 ENCOUNTER — Ambulatory Visit
Admission: RE | Admit: 2013-03-02 | Discharge: 2013-03-02 | Disposition: A | Discharge status: Home / Self Care | Payer: BC Managed Care – PPO | Source: Ambulatory Visit | Attending: Radiation Oncology | Admitting: Radiation Oncology

## 2013-03-02 ENCOUNTER — Encounter: Payer: Self-pay | Admitting: Family Medicine

## 2013-03-02 ENCOUNTER — Other Ambulatory Visit: Payer: Self-pay | Admitting: Family Medicine

## 2013-03-02 ENCOUNTER — Other Ambulatory Visit: Payer: Self-pay | Admitting: Physician Assistant

## 2013-03-02 ENCOUNTER — Ambulatory Visit (HOSPITAL_BASED_OUTPATIENT_CLINIC_OR_DEPARTMENT_OTHER): Payer: BC Managed Care – PPO | Admitting: Oncology

## 2013-03-02 ENCOUNTER — Encounter: Payer: Self-pay | Admitting: Oncology

## 2013-03-02 ENCOUNTER — Other Ambulatory Visit (HOSPITAL_BASED_OUTPATIENT_CLINIC_OR_DEPARTMENT_OTHER): Payer: BC Managed Care – PPO | Admitting: Lab

## 2013-03-02 ENCOUNTER — Ambulatory Visit: Payer: BC Managed Care – PPO | Attending: General Surgery | Admitting: Physical Therapy

## 2013-03-02 ENCOUNTER — Telehealth: Payer: Self-pay | Admitting: Oncology

## 2013-03-02 ENCOUNTER — Ambulatory Visit: Payer: BC Managed Care – PPO

## 2013-03-02 ENCOUNTER — Ambulatory Visit (HOSPITAL_BASED_OUTPATIENT_CLINIC_OR_DEPARTMENT_OTHER): Payer: BC Managed Care – PPO | Admitting: General Surgery

## 2013-03-02 VITALS — BP 146/90 | HR 79 | Temp 98.4°F | Resp 20 | Ht 65.0 in | Wt 148.2 lb

## 2013-03-02 DIAGNOSIS — C50419 Malignant neoplasm of upper-outer quadrant of unspecified female breast: Secondary | ICD-10-CM

## 2013-03-02 DIAGNOSIS — R928 Other abnormal and inconclusive findings on diagnostic imaging of breast: Secondary | ICD-10-CM

## 2013-03-02 DIAGNOSIS — C50412 Malignant neoplasm of upper-outer quadrant of left female breast: Secondary | ICD-10-CM

## 2013-03-02 DIAGNOSIS — IMO0001 Reserved for inherently not codable concepts without codable children: Secondary | ICD-10-CM | POA: Insufficient documentation

## 2013-03-02 DIAGNOSIS — R293 Abnormal posture: Secondary | ICD-10-CM | POA: Insufficient documentation

## 2013-03-02 DIAGNOSIS — C50919 Malignant neoplasm of unspecified site of unspecified female breast: Secondary | ICD-10-CM | POA: Insufficient documentation

## 2013-03-02 LAB — COMPREHENSIVE METABOLIC PANEL (CC13)
Alkaline Phosphatase: 103 U/L (ref 40–150)
BUN: 9.2 mg/dL (ref 7.0–26.0)
Creatinine: 0.8 mg/dL (ref 0.6–1.1)
Glucose: 93 mg/dl (ref 70–140)
Total Bilirubin: 0.36 mg/dL (ref 0.20–1.20)

## 2013-03-02 LAB — CBC WITH DIFFERENTIAL/PLATELET
Basophils Absolute: 0 10*3/uL (ref 0.0–0.1)
Eosinophils Absolute: 0 10*3/uL (ref 0.0–0.5)
LYMPH%: 31.8 % (ref 14.0–49.7)
MCV: 87.2 fL (ref 79.5–101.0)
MONO%: 6.5 % (ref 0.0–14.0)
NEUT#: 2.8 10*3/uL (ref 1.5–6.5)
Platelets: 325 10*3/uL (ref 145–400)
RBC: 4.76 10*6/uL (ref 3.70–5.45)

## 2013-03-02 NOTE — Progress Notes (Signed)
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 female  who presented with a palpable left breast mass. Mammogram showed a 1.4 cm mass with a adjacent nodule measuring 1.2 cm. MRI confirmed these 2 masses as well as additional nodule measuring 4 mm with a total area of involvement of 4 cm. 2 nodules were noted on MRI on the right as well and biopsy was recommended. 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 She is done well since her biopsies. She has minimal pain. She has discussed genetic testing and neoadjuvant chemotherapy with Dr. Daneil Dolin not and Dr. Donell Beers. She has no family history of breast cancer. She had her first menses at 52 and is still having regular periods. She is GX P2 with her first live birth at 54.Marland Kitchen  PREVIOUS RADIATION THERAPY: No  PAST MEDICAL HISTORY:  has a past medical history of Depression; Vitamin D deficiency; Anxiety; Seasonal allergies; HTN (hypertension); Insomnia; and History of migraines.    PAST SURGICAL HISTORY: Past Surgical History  Procedure Laterality Date  . Bunionectomy Left 2009    FAMILY HISTORY:  Family History  Problem Relation Age of Onset  . Heart attack Brother     SOCIAL HISTORY:  History  Substance Use Topics  . Smoking status: Never Smoker   . Smokeless tobacco: Not on file  . Alcohol Use: No    ALLERGIES: Review of patient's allergies indicates no known allergies.  MEDICATIONS:  Current Outpatient Prescriptions  Medication Sig Dispense Refill  . ALPRAZolam (XANAX) 1 MG tablet Take 1 mg by mouth 2 (two) times daily as needed for sleep (1/2 tablet bid as needed).      Marland Kitchen  buPROPion (WELLBUTRIN XL) 300 MG 24 hr tablet TAKE 1 TABLET (300 MG TOTAL) BY MOUTH EVERY MORNING.  30 tablet  3  . ergocalciferol (VITAMIN D2) 50000 UNITS capsule Take 1 capsule (50,000 Units total) by mouth once a week. x 8 weeks  8 capsule  0  . ibuprofen (ADVIL,MOTRIN) 800 MG tablet TAKE 1 TABLET (800 MG TOTAL) BY MOUTH EVERY 8 (EIGHT) HOURS AS NEEDED FOR PAIN.  30 tablet  2  . propranolol ER (INDERAL LA) 120 MG 24 hr capsule TAKE 1 CAPSULE (120 MG TOTAL) BY MOUTH DAILY.  93 capsule  1  . zolpidem (AMBIEN) 5 MG tablet TAKE 1 TABLET BY MOUTH AT BEDTIME AS NEEDED FOR SLEEP  30 tablet  0   No current facility-administered medications for this encounter.    REVIEW OF SYSTEMS:  A 15 point review of systems is documented in the electronic medical record. This was obtained by the nursing staff. However, I reviewed this with the patient to discuss relevant findings and make appropriate changes.  Pertinent items are noted in HPI.  PHYSICAL EXAM: There were no vitals filed for this visit.. . He is a pleasant female in no distress sitting comfortably examining table. She has no palpable cervical or subclavicular adenopathy. She has small breasts bilaterally. She has a palpable mass in the upper outer quadrant of the left breast. She has no lymphedema bilaterally. Her strength is 5 out of 5 in her bilateral upper and  lower extremities.  LABORATORY DATA:  Lab Results  Component Value Date   WBC 4.7 03/02/2013   HGB 13.4 03/02/2013   HCT 41.5 03/02/2013   MCV 87.2 03/02/2013   PLT 325 03/02/2013   Lab Results  Component Value Date   NA 142 03/02/2013   K 3.4* 03/02/2013   CL 110 06/16/2011   CO2 25 03/02/2013   Lab Results  Component Value Date   ALT 13 03/02/2013   AST 14 03/02/2013   ALKPHOS 103 03/02/2013   BILITOT 0.36 03/02/2013     RADIOGRAPHY: Mr Breast Bilateral W Wo Contrast  03/01/2013   *RADIOLOGY REPORT*  Clinical Data:Recently diagnosed left breast invasive mammary carcinoma.   Preoperative evaluation.  BUN and creatinine were obtained on site at [facility] at [address] Results:  BUN 9 mg/dL,  Creatinine 0.9 mg/dL.  BILATERAL BREAST MRI WITH AND WITHOUT CONTRAST  Technique: Multiplanar, multisequence MR images of both breasts were obtained prior to and following the intravenous administration of 14ml of Multihance.  THREE-DIMENSIONAL MR IMAGE RENDERING ON INDEPENDENT WORKSTATION: Three-dimensional MR images were rendered by post-processing  of the original MR data on an independent DynaCad workstation. The three-dimensional MR images were interpreted, and findings are reported in the following complete MRI report for this study.  Comparison:  Recent imaging examinations.  FINDINGS:  Breast composition:  c:  Heterogeneous fibroglandular tissue  Background parenchymal enhancement: Moderate.  Right breast:  Within the right breast there is an area of irregular enhancement located within the superior central third of the breast which measures 7 x 6 x 9 mm in size.  This is associated with plateau enhancement.  This is worrisome for possible DCIS. Located 1.5 cm medial to this area of enhancement is a similar 9 x 6 x 5 mm irregular area of clumped linear enhancement also worrisome for possible DCIS.  MR guided biopsy of these areas is recommended.  There are several small, oval, circumscribed enhancing nodules located laterally within the upper-outer quadrant of the right breast which correspond to mammographically stable intramammary lymph nodes.  Left breast:  There is an irregular enhancing mass located within the upper-outer quadrant of the left breast (lower axillary region) with associated clip artifact. Immediately adjacent to this mass is a contiguous irregular enhancing mass with clip artifact related to the patient's recent clip placement.  In aggregate, the area of enhancement measures 2.9 x 2.1 x 2.0 cm in size.  Located 5 mm lateral to this mass is an additional  10 x 6 x 9 mm irregular enhancing mass worrisome for an additional satellite nodule or low axillary metastatic lymph node.  There are no additional worrisome enhancing foci within the left breast.  Lymph nodes:  No additional abnormal appearing lymph nodes and no evidence for internal mammary adenopathy.  Ancillary findings:  No additional findings  IMPRESSION:  1.  2.9 cm irregular enhancing mass located within the lower axillary portion of the left breast corresponding to the recently biopsied mass and adjacent satellite nodule (which contains clip artifact). 2.  1 cm irregular enhancing mass located 5 mm lateral to the known carcinoma worrisome for an additional satellite nodule versus a metastatic intramammary lymph node. 3.  9 x 7 x 6 mm  irregular enhancing focus with an additional 9 x 6 x 5 mm area of clumped linear enhancement located medial to this as discussed above.  Both of these areas are worrisome for possible DCIS and MR guided biopsy is recommended.  RECOMMENDATION: Right breast MR guided biopsies as discussed above.  BI-RADS CATEGORY 4:  Suspicious abnormality - biopsy should be considered.   Original Report Authenticated By: Rolla Plate, M.D.   Mm Digital Diagnostic Unilat L  02/22/2013   *RADIOLOGY REPORT*  Clinical Data:  Ultrasound-guided core needle biopsy of a microlobulated 1.4 cm mass at 1 o'clock 9 cm from the left nipple with clip placement.  A 4 mm satellite lesion was clipped but not biopsied.  DIGITAL DIAGNOSTIC LEFT MAMMOGRAM  Comparison:  Previous exams.  Findings:  Films are performed following ultrasound guided biopsy of a 1.4 cm mass at 1 o'clock 9 cm from the left nipple.  The ribbon clip is placed within this mass.  A TriMark clip is placed within the 4 mm adjacent satellite nodule which was not biopsied.  IMPRESSION: Appropriate clip placement as described above.   Original Report Authenticated By: Cain Saupe, M.D.   Mm Radiologist Eval And Mgmt  02/23/2013    *RADIOLOGY REPORT*  ESTABLISHED PATIENT OFFICE VISIT - LEVEL II [id number])  Chief Complaint:  52 year old female returning for results of ultrasound-guided left breast biopsy performed on 02/22/2013. The patient has no complaints with her biopsy site.  History:  52 year old female with 1.4 cm mass in the upper outer left breast.  The patient underwent ultrasound guided left breast biopsy on 02/22/2013.  Exam:  The biopsy site was clean and dry.  There is no evidence of hematoma or infection.  Pathology: INVASIVE MAMMARY CARCINOMA. Histology correlates with imaging findings.  Assessment and Plan:  Surgery/oncology consultation. An appointment at the [facility] has been scheduled for 03/02/2013. Bilateral breast MRI, which has been scheduled for 02/28/2013. The patient was made aware of these appointments.   Original Report Authenticated By: Harmon Pier, M.D.   [place] Lt Breast Bx W Loc Dev 1st Lesion Img Bx Spec US Guide  02/22/2013   *RADIOLOGY REPORT*  Clinical Data:  1.4 cm microlobulated mass at 1 o'clock 9 cm from the left nipple.  ULTRASOUND GUIDED VACUUM ASSISTED CORE BIOPSY OF THE LEFT BREAST  Sonography prior to the biopsy demonstrated a 4 mm microlobulated mass at 1 o'clock 9 cm from the left nipple, 4 mm from the previously noted mass.  Sonography of the left axilla demonstrates a lymph node with prominent but not definitely abnormal cortex.  The patient and I discussed the procedure of ultrasound-guided biopsy, including benefits and alternatives.  We discussed the high likelihood of a successful procedure. We discussed the risks of the procedure including infection, bleeding, tissue injury, clip migration, and inadequate sampling.  Written informed consent was given. Appropriate time-out was performed.  Using sterile technique, 2% lidocaine, ultrasound guidance, and a 13 gauge vacuum assisted needle, biopsy was performed of the 1.4 cm mass at 1 o'clock 9 cm from the left nipple  using a lateromedial approach.  At the conclusion of the procedure, a ribbon tissue marker clip was deployed into the biopsy cavity. A TriMark clip was placed within the satellite nodule 4 mm from the biopsied mass. Follow-up 2-view mammogram was performed and dictated separately.  IMPRESSION: Ultrasound-guided biopsy of a 1.4 cm mass at 1 o'clock 9 cm from the left nipple.  Ribbon clip placement within this mass.  TriMark clip placed within 4 mm satellite nodule.  No apparent complications.   Original Report Authenticated By: Cain Saupe, M.D.      IMPRESSION: T1cN0 triple negative left breast cancer with biopsies pending.  PLAN: I discussed with Ms.  Laural Young that there is really a lot going on in terms of her other biopsies. She has very small breasts of any of these biopsies turn out to be positive she may require mastectomy for cosmetic reasons. We discussed that if her biopsies turn out to be negative and all she had was this area in the upper-outer quadrant that she can undergo neoadjuvant chemotherapy followed by lumpectomy. We discussed the role of radiation in decreasing local failures in patients who undergo lumpectomy. We discussed the indications from postmastectomy radiation including tumor size over 5 cm and positive lymph nodes as indications for postmastectomy radiation. She has a lot on her plate and does not wish to discuss radiation he further at this time. I think that's reasonable. She has met with surgery medical oncology. I will see her back after chemotherapy and surgery are complete if she has a lumpectomy or if she has a risk factors described above. She was grateful for our conversation.  I spent 30 minutes  face to face with the patient and more than 50% of that time was spent in counseling and/or coordination of care.   ------------------------------------------------  Lurline Hare, MD

## 2013-03-02 NOTE — Progress Notes (Signed)
[facility] Psychosocial Distress Screening Clinical Social Work  Patient completd distress screening protocol, and scored a 5 on the Psychosocial Distress Thermometer which indicates moderate distress. Clinical Social Worker met with pt in [facility] to assess for distress and other psychosocial needs.  Pt stated she she was doing "ok", and "felt better" after getting informaton on her treatment plan.  CSW informed pt of the support team and support programs at [facility].  Pt also expressed concern for telling her children about her diagnosis.  CSW provided pt with information on talking with your children about cancer.  CSw and pt are also scheduled to meet after chemo class on 03/08/13.  CSw encouraged pt to call if she has any questions or concerns.     Tamala Julian, MSW, LCSW Clinical Social Worker [facility] [phone]

## 2013-03-02 NOTE — Progress Notes (Signed)
ID: [name] OB: 25-Jul-1960  MR#: [id number]  CSN#:[id number]  PCP: Maryjean Ka, MD GYN:   SU: Almond Lint OTHER MD: Lurline Hare, Paula Compton   HISTORY OF PRESENT ILLNESS: Ana Young herself palpated a mass in her left breast 01/15/2013 and brought it to the attention of Dr. Mauricio Po, who arranged for her to have bilateral digital mammography at the [facility] Center 01/27/2013. The patient does have heterogeneously dense breasts. In the outer upper left breast there was an oval mass by mammography measuring approximately 1.5 cm, which by palpation was firm and measured approximately 1 cm. Ultrasound showed this to be hypoechoic and to measure 1.4 cm.  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  The patient then underwent bilateral breast MRI 02/28/2013. In the left breast upper-outer quadrant the mass in question measured 2.9 cm. There was a second mass lateral to it measuring 1.0 cm. There were no additional abnormal appearing lymph nodes and no evidence of internal mammary adenopathy.  In the right breast there were 2 areas of irregular enhancement in the superior central third of the breast, both measuring 9 mm. These were felt to be worrisome for a ductal carcinoma in situ.  The patient's case was discussed at the multidisciplinary breast cancer conference 03/02/2013. Her subsequent history is as detailed below.  INTERVAL HISTORY: Chrystel was seen in the multidisciplinary breast cancer clinic 03/02/2013 accompanied by her husband [name] and by her parents.  REVIEW OF SYSTEMS: Aside from concerns regarding the breast cancer itself, Tinaya's review of systems today was entirely negative. She denies a history of seizures, stroke, migraines, or recent headaches, visual changes, gait imbalance, nausea, vomiting, fever, rash, bleeding, unexplained  weight loss or unexplained fatigue, cough, phlegm production, pleurisy, shortness of breath, chest pain or pressure, palpitations, or any change in bowel or bladder habits. She has been exercising quite a bit in preparation for a job with the [employer], doing kickboxing and jogging.  PAST MEDICAL HISTORY: Past Medical History  Diagnosis Date  . Depression   . Vitamin D deficiency   . Anxiety   . Seasonal allergies   . HTN (hypertension)   . Insomnia   . History of migraines     PAST SURGICAL HISTORY: Past Surgical History  Procedure Laterality Date  . Bunionectomy Left 2009    FAMILY HISTORY Family History  Problem Relation Age of Onset  . Heart attack Brother    the patient's parents are in their 34s. The patient has one brother, no sisters. The patient's maternal grandmother was diagnosed with breast cancer the age of 36. There is no other history of breast cancer in the family to the patient's knowledge, and no history of ovarian cancer  GYNECOLOGIC HISTORY:  Menarche age 57, first live birth age 63, the patient is GX P2. Her periods are not irregular, but still every 2-3 months, and otherwise unremarkable.  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 Her husband [name] works as a Actuary. Son [name] lives in [place] and works in Engineering geologist. Daughter Ireta Pullman is 57 year old and at home. The patient has no grandchildren. She attends the [employer]    ADVANCED DIRECTIVES: Not in place  HEALTH MAINTENANCE: History  Substance Use Topics  . Smoking  status: Never Smoker   . Smokeless tobacco: Not on file  . Alcohol Use: No     Colonoscopy: -  PAP:  Bone density: -  Lipid panel:  No Known Allergies  Current Outpatient Prescriptions  Medication Sig Dispense Refill  . ALPRAZolam (XANAX) 1 MG tablet Take 1 mg by mouth 2  (two) times daily as needed for sleep (1/2 tablet bid as needed).      Marland Kitchen buPROPion (WELLBUTRIN XL) 300 MG 24 hr tablet TAKE 1 TABLET (300 MG TOTAL) BY MOUTH EVERY MORNING.  30 tablet  3  . ibuprofen (ADVIL,MOTRIN) 800 MG tablet TAKE 1 TABLET (800 MG TOTAL) BY MOUTH EVERY 8 (EIGHT) HOURS AS NEEDED FOR PAIN.  30 tablet  2  . propranolol ER (INDERAL LA) 120 MG 24 hr capsule TAKE 1 CAPSULE (120 MG TOTAL) BY MOUTH DAILY.  93 capsule  1  . zolpidem (AMBIEN) 5 MG tablet TAKE 1 TABLET BY MOUTH AT BEDTIME AS NEEDED FOR SLEEP  30 tablet  0  . ergocalciferol (VITAMIN D2) 50000 UNITS capsule Take 1 capsule (50,000 Units total) by mouth once a week. x 8 weeks  8 capsule  0   No current facility-administered medications for this visit.    OBJECTIVE: Middle-aged [place] American woman who appears stated age 52 Vitals:   03/02/13 0903  BP: 146/90  Pulse: 79  Temp: 98.4 F (36.9 C)  Resp: 20     Body mass index is 24.66 kg/(m^2).    ECOG FS:0 - Asymptomatic  Sclerae unicteric Oropharynx clear No cervical or supraclavicular adenopathy Lungs no rales or rhonchi Heart regular rate and rhythm Abd benign MSK no focal spinal tenderness, no peripheral edema Neuro: non-focal, well-oriented, tearful affect Breasts: I do not palpate any mass in the right breast and I do not see any skin or nipple change of concern. The right axilla is benign. In the left breast, in the tail of Spence area, there is a 1-1-1/2 cm movable mass easily palpable. I do not palpate any other masses in the breast, there is no skin or nipple change of concern, and the left axilla is otherwise unremarkable.   LAB RESULTS:  CMP     Component Value Date/Time   NA 142 03/02/2013 0810   NA 138 06/16/2011 0938   K 3.4* 03/02/2013 0810   K 4.1 06/16/2011 0938   CL 110 06/16/2011 0938   CO2 25 03/02/2013 0810   CO2 26 06/16/2011 0938   GLUCOSE 93 03/02/2013 0810   GLUCOSE 94 06/16/2011 0938   BUN 9.2 03/02/2013 0810   BUN 10  06/16/2011 0938   CREATININE 0.8 03/02/2013 0810   CREATININE 0.81 06/16/2011 0938   CREATININE 0.74 07/26/2010 2332   CALCIUM 9.4 03/02/2013 0810   CALCIUM 9.5 06/16/2011 0938   PROT 7.5 03/02/2013 0810   PROT 6.6 06/16/2011 0938   ALBUMIN 3.7 03/02/2013 0810   ALBUMIN 4.1 06/16/2011 0938   AST 14 03/02/2013 0810   AST 13 06/16/2011 0938   ALT 13 03/02/2013 0810   ALT 10 06/16/2011 0938   ALKPHOS 103 03/02/2013 0810   ALKPHOS 78 06/16/2011 0938   BILITOT 0.36 03/02/2013 0810   BILITOT 0.5 06/16/2011 0938    I No results found for this basename: SPEP, UPEP,  kappa and lambda light chains    Lab Results  Component Value Date   WBC 4.7 03/02/2013   NEUTROABS 2.8 03/02/2013   HGB 13.4 03/02/2013   HCT 41.5 03/02/2013  MCV 87.2 03/02/2013   PLT 325 03/02/2013      Chemistry      Component Value Date/Time   NA 142 03/02/2013 0810   NA 138 06/16/2011 0938   K 3.4* 03/02/2013 0810   K 4.1 06/16/2011 0938   CL 110 06/16/2011 0938   CO2 25 03/02/2013 0810   CO2 26 06/16/2011 0938   BUN 9.2 03/02/2013 0810   BUN 10 06/16/2011 0938   CREATININE 0.8 03/02/2013 0810   CREATININE 0.81 06/16/2011 0938   CREATININE 0.74 07/26/2010 2332      Component Value Date/Time   CALCIUM 9.4 03/02/2013 0810   CALCIUM 9.5 06/16/2011 0938   ALKPHOS 103 03/02/2013 0810   ALKPHOS 78 06/16/2011 0938   AST 14 03/02/2013 0810   AST 13 06/16/2011 0938   ALT 13 03/02/2013 0810   ALT 10 06/16/2011 0938   BILITOT 0.36 03/02/2013 0810   BILITOT 0.5 06/16/2011 0938       No results found for this basename: LABCA2    No components found with this basename: LABCA125    No results found for this basename: INR,  in the last 168 hours  Urinalysis    Component Value Date/Time   BILIRUBINUR NEG 02/02/2012 1015   UROBILINOGEN 0.2 02/02/2012 1015   NITRITE NEG 02/02/2012 1015   LEUKOCYTESUR Negative 02/02/2012 1015    STUDIES: Mr Breast Bilateral W Wo Contrast  03/01/2013   *RADIOLOGY REPORT*  Clinical  Data:Recently diagnosed left breast invasive mammary carcinoma.  Preoperative evaluation.  BUN and creatinine were obtained on site at [facility] at [address] Results:  BUN 9 mg/dL,  Creatinine 0.9 mg/dL.  BILATERAL BREAST MRI WITH AND WITHOUT CONTRAST  Technique: Multiplanar, multisequence MR images of both breasts were obtained prior to and following the intravenous administration of 14ml of Multihance.  THREE-DIMENSIONAL MR IMAGE RENDERING ON INDEPENDENT WORKSTATION: Three-dimensional MR images were rendered by post-processing  of the original MR data on an independent DynaCad workstation. The three-dimensional MR images were interpreted, and findings are reported in the following complete MRI report for this study.  Comparison:  Recent imaging examinations.  FINDINGS:  Breast composition:  c:  Heterogeneous fibroglandular tissue  Background parenchymal enhancement: Moderate.  Right breast:  Within the right breast there is an area of irregular enhancement located within the superior central third of the breast which measures 7 x 6 x 9 mm in size.  This is associated with plateau enhancement.  This is worrisome for possible DCIS. Located 1.5 cm medial to this area of enhancement is a similar 9 x 6 x 5 mm irregular area of clumped linear enhancement also worrisome for possible DCIS.  MR guided biopsy of these areas is recommended.  There are several small, oval, circumscribed enhancing nodules located laterally within the upper-outer quadrant of the right breast which correspond to mammographically stable intramammary lymph nodes.  Left breast:  There is an irregular enhancing mass located within the upper-outer quadrant of the left breast (lower axillary region) with associated clip artifact. Immediately adjacent to this mass is a contiguous irregular enhancing mass with clip artifact related to the patient's recent clip placement.  In aggregate, the area of enhancement measures 2.9 x 2.1 x 2.0  cm in size.  Located 5 mm lateral to this mass is an additional 10 x 6 x 9 mm irregular enhancing mass worrisome for an additional satellite nodule or low axillary metastatic lymph node.  There are no additional worrisome enhancing  foci within the left breast.  Lymph nodes:  No additional abnormal appearing lymph nodes and no evidence for internal mammary adenopathy.  Ancillary findings:  No additional findings  IMPRESSION:  1.  2.9 cm irregular enhancing mass located within the lower axillary portion of the left breast corresponding to the recently biopsied mass and adjacent satellite nodule (which contains clip artifact). 2.  1 cm irregular enhancing mass located 5 mm lateral to the known carcinoma worrisome for an additional satellite nodule versus a metastatic intramammary lymph node. 3.  9 x 7 x 6 mm  irregular enhancing focus with an additional 9 x 6 x 5 mm area of clumped linear enhancement located medial to this as discussed above.  Both of these areas are worrisome for possible DCIS and MR guided biopsy is recommended.  RECOMMENDATION: Right breast MR guided biopsies as discussed above.  BI-RADS CATEGORY 4:  Suspicious abnormality - biopsy should be considered.   Original Report Authenticated By: Rolla Plate, M.D.   Mm Digital Diagnostic Unilat L  02/22/2013   *RADIOLOGY REPORT*  Clinical Data:  Ultrasound-guided core needle biopsy of a microlobulated 1.4 cm mass at 1 o'clock 9 cm from the left nipple with clip placement.  A 4 mm satellite lesion was clipped but not biopsied.  DIGITAL DIAGNOSTIC LEFT MAMMOGRAM  Comparison:  Previous exams.  Findings:  Films are performed following ultrasound guided biopsy of a 1.4 cm mass at 1 o'clock 9 cm from the left nipple.  The ribbon clip is placed within this mass.  A TriMark clip is placed within the 4 mm adjacent satellite nodule which was not biopsied.  IMPRESSION: Appropriate clip placement as described above.   Original Report Authenticated By: Cain Saupe, M.D.   Mm Radiologist Eval And Mgmt  02/23/2013   *RADIOLOGY REPORT*  ESTABLISHED PATIENT OFFICE VISIT - LEVEL II [phone])  Chief Complaint:  52 year old female returning for results of ultrasound-guided left breast biopsy performed on 02/22/2013. The patient has no complaints with her biopsy site.  History:  52 year old female with 1.4 cm mass in the upper outer left breast.  The patient underwent ultrasound guided left breast biopsy on 02/22/2013.  Exam:  The biopsy site was clean and dry.  There is no evidence of hematoma or infection.  Pathology: INVASIVE MAMMARY CARCINOMA. Histology correlates with imaging findings.  Assessment and Plan:  Surgery/oncology consultation. An appointment at the Breast Cancer Multidisciplinary [facility] has been scheduled for 03/02/2013. Bilateral breast MRI, which has been scheduled for 02/28/2013. The patient was made aware of these appointments.   Original Report Authenticated By: Harmon Pier, M.D.   [place] Lt Breast Bx W Loc Dev 1st Lesion Img Bx Spec US Guide  02/22/2013   *RADIOLOGY REPORT*  Clinical Data:  1.4 cm microlobulated mass at 1 o'clock 9 cm from the left nipple.  ULTRASOUND GUIDED VACUUM ASSISTED CORE BIOPSY OF THE LEFT BREAST  Sonography prior to the biopsy demonstrated a 4 mm microlobulated mass at 1 o'clock 9 cm from the left nipple, 4 mm from the previously noted mass.  Sonography of the left axilla demonstrates a lymph node with prominent but not definitely abnormal cortex.  The patient and I discussed the procedure of ultrasound-guided biopsy, including benefits and alternatives.  We discussed the high likelihood of a successful procedure. We discussed the risks of the procedure including infection, bleeding, tissue injury, clip migration, and inadequate sampling.  Written informed consent was given. Appropriate time-out was performed.  Using sterile technique, 2% lidocaine,  ultrasound guidance, and a 13 gauge vacuum assisted needle, biopsy was performed  of the 1.4 cm mass at 1 o'clock 9 cm from the left nipple using a lateromedial approach.  At the conclusion of the procedure, a ribbon tissue marker clip was deployed into the biopsy cavity. A TriMark clip was placed within the satellite nodule 4 mm from the biopsied mass. Follow-up 2-view mammogram was performed and dictated separately.  IMPRESSION: Ultrasound-guided biopsy of a 1.4 cm mass at 1 o'clock 9 cm from the left nipple.  Ribbon clip placement within this mass.  TriMark clip placed within 4 mm satellite nodule.  No apparent complications.   Original Report Authenticated By: Cain Saupe, M.D.    ASSESSMENT: 52 y.o. Adline Peals woman status post left breast upper outer quadrant biopsy 02/22/2013 for a clinical T2 N0, stage IIA invasive ductal carcinoma, grade 3, triple negative, with an MIB-1 of 100%.  (1) the patient is a ready scheduled for additional biopsies of the contralateral breast, and is considering biopsies of the ipsilateral breast depending on her final surgical decision (lumpectomy versus mastectomy).  (2) genetics testing is pending  PLAN: We spent the better part of today's our-long visit discussing the biology of breast cancer in general and the specifics of the patient's tumor. As far surgery is concerned, Cheyenne understands there is no survival advantage to mastectomy or lumpectomy plus radiation. She understands that if the additional areas of concern in the left breast proved to be malignant, the cosmetic results may be unacceptable with multiple lumpectomies or bury large lumpectomy, and on the left side mastectomy in that case would be difficult to avoid. On the right side lumpectomy should be possible if the 2 lesions seen there are indeed malignant. The patient is considering bilateral mastectomies but has not made a definitive decision at this point.  She understands also that whether she receives chemotherapy followed by surgery or surgery followed by chemotherapy  the ultimate result is the same and indeed the chemotherapy given his the same. We are recommending neoadjuvant chemotherapy for her, which will give her time to get genetically tested and more time to think regarding her surgical options.  The chemotherapy plan will consist of cyclophosphamide and doxorubicin given in dose dense fashion at standard doses for 4 cycles followed by weekly carboplatin and paclitaxel again at standard doses for 12 weeks. The total treatment time therefore will be 5 months (20 weeks). After that the patient will have her definitive surgery. If she chooses lumpectomy she will then proceed to radiation. If she has mastectomy she may be able to avoid the radiation portion of her treatment.  All this was difficult for Phoenyx to assimilate and she was tearful as through the latter part of today's visit. She does understand the goal of treatment is cure. We did discuss the possible side effects, toxicities, and complications of chemotherapy but in addition she will come to chemotherapy school ASAP. Hopefully she can get her port placed later this week and we can try to get her chemotherapy started the second week in September.  Shiza knows to call for any problems that may develop before her next visit here. Lowella Dell, MD   03/02/2013 9:59 AM

## 2013-03-02 NOTE — Telephone Encounter (Signed)
, °

## 2013-03-02 NOTE — Assessment & Plan Note (Addendum)
Pt has triple negative disease.    She also needs additional biopsies on left and right.  These are scheduled for Friday. We will plan port a cath placement and neoadjuvant chemotherapy.    Surgical plan will depend on outcome of biopsies.   If the third nodule is malignant, she will probably need left sided mastectomy.   If the right sided nodules are positive, she will need surgery on that side as well.  The right side is more conducive to lumpectomy.  I discussed risks of port a cath including bleeding, infection, pain, pneumothorax, hemothorax, risk of malposition or infection.    We will schedule this next week.    30 min spent in visit, >50% counseling.

## 2013-03-02 NOTE — Progress Notes (Signed)
email and phone for communication. Checked in new patient with no financial issues. She has no poa/living will.

## 2013-03-02 NOTE — Progress Notes (Signed)
Chief complaint:  Left triple negative breast cancer.    HISTORY: Pt is a 52 yo F with new diagnosis of triple negative left breast cancer.  She had a palpable lump in the left axillary tail.  Mammogram/ultrasound positive for 1.4 cm +1.2 cm mass in this region.  MRI demonstrated those two lesions for a total of 2.9 cm, plus a 5 mm area just lateral in the left breast. Two additional lesions were seen on the right breast on MRI.  These have not been evaluated yet.  Pt had menarche at age 14.  She is still having periods.  She had two children, the first at age 22.  She had used OCPs.  She is not up to date with her pap smears.  She had a maternal grandmother with breast cancer at age 97.    Past Medical History  Diagnosis Date  . Depression   . Vitamin D deficiency   . Anxiety   . Seasonal allergies   . HTN (hypertension)   . Insomnia   . History of migraines     Past Surgical History  Procedure Laterality Date  . Bunionectomy Left 2009    Current Outpatient Prescriptions  Medication Sig Dispense Refill  . ALPRAZolam (XANAX) 1 MG tablet Take 1 mg by mouth 2 (two) times daily as needed for sleep (1/2 tablet bid as needed).      . buPROPion (WELLBUTRIN XL) 300 MG 24 hr tablet TAKE 1 TABLET (300 MG TOTAL) BY MOUTH EVERY MORNING.  30 tablet  3  . ergocalciferol (VITAMIN D2) 50000 UNITS capsule Take 1 capsule (50,000 Units total) by mouth once a week. x 8 weeks  8 capsule  0  . ibuprofen (ADVIL,MOTRIN) 800 MG tablet TAKE 1 TABLET (800 MG TOTAL) BY MOUTH EVERY 8 (EIGHT) HOURS AS NEEDED FOR PAIN.  30 tablet  2  . propranolol ER (INDERAL LA) 120 MG 24 hr capsule TAKE 1 CAPSULE (120 MG TOTAL) BY MOUTH DAILY.  93 capsule  1  . zolpidem (AMBIEN) 5 MG tablet TAKE 1 TABLET BY MOUTH AT BEDTIME AS NEEDED FOR SLEEP  30 tablet  0   No current facility-administered medications for this visit.     No Known Allergies   Family History  Problem Relation Age of Onset  . Heart attack Brother       History   Social History  . Marital Status: Married    Spouse Name: N/A    Number of Children: N/A  . Years of Education: N/A   Social History Main Topics  . Smoking status: Never Smoker   . Smokeless tobacco: Not on file  . Alcohol Use: No  . Drug Use: No  . Sexual Activity: Not on file   Other Topics Concern  . Not on file   Social History Narrative  . Kickboxes.       REVIEW OF SYSTEMS - PERTINENT POSITIVES ONLY: 12 point review of systems negative other than HPI and PMH  EXAM: Wt Readings from Last 3 Encounters:  03/02/13 148 lb 3.2 oz (67.223 kg)  01/18/13 151 lb 8 oz (68.72 kg)  02/02/12 163 lb (73.936 kg)   Temp Readings from Last 3 Encounters:  03/02/13 98.4 F (36.9 C) Oral  01/18/13 97.9 F (36.6 C) Oral  08/16/10 98.5 F (36.9 C) Oral   BP Readings from Last 3 Encounters:  03/02/13 146/90  01/18/13 148/92  02/02/12 132/88   Pulse Readings from Last 3 Encounters:  03/02/13 79    01/18/13 60  02/02/12 58    Gen:  No acute distress.  Well nourished and well groomed.  Very good shape.   Neurological: Alert and oriented to person, place, and time. Coordination normal.  Head: Normocephalic and atraumatic.  Eyes: Conjunctivae are normal. Pupils are equal, round, and reactive to light. No scleral icterus.  Neck: Normal range of motion. Neck supple. No tracheal deviation or thyromegaly present.  Cardiovascular: Normal rate, regular rhythm, normal heart sounds and intact distal pulses.  Exam reveals no gallop and no friction rub.  No murmur heard. Respiratory: Effort normal.  No respiratory distress. No chest wall tenderness. Breath sounds normal.  No wheezes, rales or rhonchi.  Breast:  Breasts are symmetric bilaterally.  Small.  No nipple retraction or skin dimpling.  Palpable mass at 2 o'clock in tail of breast.  Firm, mobile.  2 cm.  No palpable masses on left.   GI: Soft. Bowel sounds are normal. The abdomen is soft and nontender.  There is no  rebound and no guarding.  Musculoskeletal: Normal range of motion. Extremities are nontender.  Lymphadenopathy: No cervical, preauricular, postauricular or axillary adenopathy is present Skin: Skin is warm and dry. No rash noted. No diaphoresis. No erythema. No pallor. No clubbing, cyanosis, or edema.   Psychiatric: Normal mood and affect. Behavior is normal. Judgment and thought content normal.    LABORATORY RESULTS: Available labs are reviewed  CMET, CBC normal other than mild hypokalemia.    RADIOLOGY RESULTS: See E-Chart or I-Site for most recent results.  Images and reports are reviewed. MRI :  1. 2.9 cm irregular enhancing mass located within the lower  axillary portion of the left breast corresponding to the recently  biopsied mass and adjacent satellite nodule (which contains clip  artifact).  2. 1 cm irregular enhancing mass located 5 mm lateral to the known  carcinoma worrisome for an additional satellite nodule versus a  metastatic intramammary lymph node.  3. 9 x 7 x 6 mm irregular enhancing focus with an additional 9 x  6 x 5 mm area of clumped linear enhancement located medial to this  as discussed above. Both of these areas are worrisome for possible  DCIS and MR guided biopsy is recommended.  RECOMMENDATION:  Right breast MR guided biopsies as discussed above.  Mammo Targeted ultrasound at the site of palpable concern was performed.  There is a hypoechoic oval mass with internal echoes in the left  breast at 1 o'clock 6 cm from the nipple measuring 1.3 x 1 x 1.4  cm.  IMPRESSION:  Likely minimally complicated cyst in the left breast at the site of  palpable concern.  RECOMMENDATION:  Recommend ultrasound guided cyst aspiration/biopsy of the probable  cyst in the left breast. This has been scheduled for 02/22/2013 at  01:00 a.m.  I have discussed the findings and recommendations with the patient.  Results were also provided in writing at the conclusion of the   visit. If applicable, a reminder letter will be sent to the  patient regarding the next appointment.  BI-RADS CATEGORY 4: Suspicious abnormality - biopsy should be  considered.     ASSESSMENT AND PLAN: Breast cancer of upper-outer quadrant of left female breast Pt has triple negative disease.    She also needs additional biopsies on left and right.  These are scheduled for Friday. We will plan port a cath placement and neoadjuvant chemotherapy.    Surgical plan will depend on outcome of biopsies.   If   the third nodule is malignant, she will probably need left sided mastectomy.   If the right sided nodules are positive, she will need surgery on that side as well.  The right side is more conducive to lumpectomy.  I discussed risks of port a cath including bleeding, infection, pain, pneumothorax, hemothorax, risk of malposition or infection.    We will schedule this next week.    30 min spent in visit, >50% counseling.    She will also be referred for genetics.     Lavell Ridings L Jeffree Cazeau MD Surgical Oncology, General and Endocrine Surgery [facility]      Visit Diagnoses: 1. Breast cancer of upper-outer quadrant of left female breast     Primary Care Physician: Street, Christopher, MD    

## 2013-03-03 ENCOUNTER — Telehealth: Payer: Self-pay | Admitting: Oncology

## 2013-03-03 ENCOUNTER — Other Ambulatory Visit: Payer: BC Managed Care – PPO | Admitting: Lab

## 2013-03-03 ENCOUNTER — Encounter: Payer: BC Managed Care – PPO | Admitting: Genetic Counselor

## 2013-03-04 ENCOUNTER — Ambulatory Visit
Admission: RE | Admit: 2013-03-04 | Discharge: 2013-03-04 | Disposition: A | Discharge status: Home / Self Care | Payer: BC Managed Care – PPO | Source: Ambulatory Visit | Attending: Family Medicine | Admitting: Family Medicine

## 2013-03-04 ENCOUNTER — Encounter (HOSPITAL_BASED_OUTPATIENT_CLINIC_OR_DEPARTMENT_OTHER): Payer: Self-pay | Admitting: *Deleted

## 2013-03-04 DIAGNOSIS — R928 Other abnormal and inconclusive findings on diagnostic imaging of breast: Secondary | ICD-10-CM

## 2013-03-04 MED ORDER — GADOBENATE DIMEGLUMINE 529 MG/ML IV SOLN
14.0000 mL | Freq: Once | INTRAVENOUS | Status: AC | PRN
  Administered 2013-03-04: 14 mL via INTRAVENOUS

## 2013-03-08 ENCOUNTER — Encounter: Payer: Self-pay | Admitting: *Deleted

## 2013-03-08 ENCOUNTER — Other Ambulatory Visit: Payer: Self-pay | Admitting: *Deleted

## 2013-03-08 ENCOUNTER — Telehealth: Payer: Self-pay | Admitting: *Deleted

## 2013-03-08 ENCOUNTER — Telehealth: Payer: Self-pay | Admitting: Oncology

## 2013-03-08 ENCOUNTER — Other Ambulatory Visit: Payer: BC Managed Care – PPO

## 2013-03-08 ENCOUNTER — Ambulatory Visit (HOSPITAL_COMMUNITY)
Admission: RE | Admit: 2013-03-08 | Discharge: 2013-03-08 | Disposition: A | Discharge status: Home / Self Care | Payer: BC Managed Care – PPO | Source: Ambulatory Visit | Attending: Oncology | Admitting: Oncology

## 2013-03-08 DIAGNOSIS — I10 Essential (primary) hypertension: Secondary | ICD-10-CM | POA: Insufficient documentation

## 2013-03-08 DIAGNOSIS — Z01818 Encounter for other preprocedural examination: Secondary | ICD-10-CM | POA: Insufficient documentation

## 2013-03-08 DIAGNOSIS — Z5111 Encounter for antineoplastic chemotherapy: Secondary | ICD-10-CM

## 2013-03-08 DIAGNOSIS — C50919 Malignant neoplasm of unspecified site of unspecified female breast: Secondary | ICD-10-CM | POA: Insufficient documentation

## 2013-03-08 DIAGNOSIS — C50412 Malignant neoplasm of upper-outer quadrant of left female breast: Secondary | ICD-10-CM

## 2013-03-08 MED ORDER — ZOLPIDEM TARTRATE 5 MG PO TABS: ORAL_TABLET | ORAL | Status: DC

## 2013-03-08 NOTE — Telephone Encounter (Signed)
Left message for a return phone call.  Follow up form [facility] 03/02/13.

## 2013-03-08 NOTE — Telephone Encounter (Signed)
Per staff message and POF I have scheduled appts.  JMW  

## 2013-03-08 NOTE — Progress Notes (Signed)
  Echocardiogram 2D Echocardiogram has been performed.  Jahniya Duzan 03/08/2013, 11:16 AM

## 2013-03-08 NOTE — Telephone Encounter (Signed)
Patient called regarding her appt times for today. I have given the times and location for appts.

## 2013-03-09 ENCOUNTER — Encounter (HOSPITAL_BASED_OUTPATIENT_CLINIC_OR_DEPARTMENT_OTHER)
Admission: RE | Disposition: A | Discharge status: Home / Self Care | Payer: Self-pay | Source: Ambulatory Visit | Attending: General Surgery

## 2013-03-09 ENCOUNTER — Encounter (HOSPITAL_BASED_OUTPATIENT_CLINIC_OR_DEPARTMENT_OTHER): Payer: Self-pay | Admitting: Certified Registered Nurse Anesthetist

## 2013-03-09 ENCOUNTER — Telehealth: Payer: Self-pay | Admitting: Oncology

## 2013-03-09 ENCOUNTER — Ambulatory Visit (HOSPITAL_BASED_OUTPATIENT_CLINIC_OR_DEPARTMENT_OTHER)
Admission: RE | Admit: 2013-03-09 | Discharge: 2013-03-09 | Disposition: A | Discharge status: Home / Self Care | Payer: BC Managed Care – PPO | Source: Ambulatory Visit | Attending: General Surgery | Admitting: General Surgery

## 2013-03-09 ENCOUNTER — Ambulatory Visit (HOSPITAL_COMMUNITY): Payer: BC Managed Care – PPO

## 2013-03-09 ENCOUNTER — Ambulatory Visit (HOSPITAL_BASED_OUTPATIENT_CLINIC_OR_DEPARTMENT_OTHER): Payer: BC Managed Care – PPO | Admitting: Certified Registered Nurse Anesthetist

## 2013-03-09 DIAGNOSIS — E559 Vitamin D deficiency, unspecified: Secondary | ICD-10-CM | POA: Insufficient documentation

## 2013-03-09 DIAGNOSIS — C50419 Malignant neoplasm of upper-outer quadrant of unspecified female breast: Secondary | ICD-10-CM

## 2013-03-09 DIAGNOSIS — I10 Essential (primary) hypertension: Secondary | ICD-10-CM | POA: Insufficient documentation

## 2013-03-09 DIAGNOSIS — C50919 Malignant neoplasm of unspecified site of unspecified female breast: Secondary | ICD-10-CM | POA: Insufficient documentation

## 2013-03-09 DIAGNOSIS — G4700 Insomnia, unspecified: Secondary | ICD-10-CM | POA: Insufficient documentation

## 2013-03-09 DIAGNOSIS — F329 Major depressive disorder, single episode, unspecified: Secondary | ICD-10-CM | POA: Insufficient documentation

## 2013-03-09 DIAGNOSIS — Z79899 Other long term (current) drug therapy: Secondary | ICD-10-CM | POA: Insufficient documentation

## 2013-03-09 DIAGNOSIS — Z171 Estrogen receptor negative status [ER-]: Secondary | ICD-10-CM | POA: Insufficient documentation

## 2013-03-09 DIAGNOSIS — G43909 Migraine, unspecified, not intractable, without status migrainosus: Secondary | ICD-10-CM | POA: Insufficient documentation

## 2013-03-09 DIAGNOSIS — F3289 Other specified depressive episodes: Secondary | ICD-10-CM | POA: Insufficient documentation

## 2013-03-09 DIAGNOSIS — Z803 Family history of malignant neoplasm of breast: Secondary | ICD-10-CM | POA: Insufficient documentation

## 2013-03-09 DIAGNOSIS — J309 Allergic rhinitis, unspecified: Secondary | ICD-10-CM | POA: Insufficient documentation

## 2013-03-09 HISTORY — DX: Malignant (primary) neoplasm, unspecified: C80.1

## 2013-03-09 HISTORY — PX: PORTACATH PLACEMENT: SHX2246

## 2013-03-09 SURGERY — INSERTION, TUNNELED CENTRAL VENOUS DEVICE, WITH PORT
Anesthesia: General | Site: Chest | Laterality: Left | Wound class: Clean

## 2013-03-09 MED ORDER — BUPIVACAINE-EPINEPHRINE 0.5% -1:200000 IJ SOLN
INTRAMUSCULAR | Status: DC | PRN
  Administered 2013-03-09: 10 mL

## 2013-03-09 MED ORDER — DEXAMETHASONE SODIUM PHOSPHATE 4 MG/ML IJ SOLN
INTRAMUSCULAR | Status: DC | PRN
  Administered 2013-03-09: 10 mg via INTRAVENOUS

## 2013-03-09 MED ORDER — ZOLPIDEM TARTRATE 5 MG PO TABS: ORAL_TABLET | ORAL | Status: DC

## 2013-03-09 MED ORDER — HEPARIN (PORCINE) IN NACL 2-0.9 UNIT/ML-% IJ SOLN
INTRAMUSCULAR | Status: DC | PRN
  Administered 2013-03-09: 500 mL via INTRAVENOUS

## 2013-03-09 MED ORDER — LACTATED RINGERS IV SOLN
INTRAVENOUS | Status: DC
  Administered 2013-03-09 (×2): via INTRAVENOUS

## 2013-03-09 MED ORDER — PROPOFOL 10 MG/ML IV BOLUS
INTRAVENOUS | Status: DC | PRN
  Administered 2013-03-09: 200 mg via INTRAVENOUS

## 2013-03-09 MED ORDER — ONDANSETRON HCL 4 MG/2ML IJ SOLN
INTRAMUSCULAR | Status: DC | PRN
  Administered 2013-03-09: 4 mg via INTRAVENOUS

## 2013-03-09 MED ORDER — FENTANYL CITRATE 0.05 MG/ML IJ SOLN: 50.0000 ug | INTRAMUSCULAR | Status: DC | PRN

## 2013-03-09 MED ORDER — FENTANYL CITRATE 0.05 MG/ML IJ SOLN
INTRAMUSCULAR | Status: DC | PRN
  Administered 2013-03-09: 50 ug via INTRAVENOUS

## 2013-03-09 MED ORDER — HEPARIN SOD (PORK) LOCK FLUSH 100 UNIT/ML IV SOLN
INTRAVENOUS | Status: DC | PRN
  Administered 2013-03-09: 500 [IU] via INTRAVENOUS

## 2013-03-09 MED ORDER — MIDAZOLAM HCL 5 MG/5ML IJ SOLN
INTRAMUSCULAR | Status: DC | PRN
  Administered 2013-03-09: 2 mg via INTRAVENOUS

## 2013-03-09 MED ORDER — MIDAZOLAM HCL 2 MG/2ML IJ SOLN: 1.0000 mg | INTRAMUSCULAR | Status: DC | PRN

## 2013-03-09 MED ORDER — LIDOCAINE-PRILOCAINE 2.5-2.5 % EX CREA: TOPICAL_CREAM | CUTANEOUS | Status: DC | PRN

## 2013-03-09 MED ORDER — CHLORHEXIDINE GLUCONATE 4 % EX LIQD: 1.0000 "application " | Freq: Once | CUTANEOUS | Status: DC

## 2013-03-09 MED ORDER — OXYCODONE-ACETAMINOPHEN 5-325 MG PO TABS: 1.0000 | ORAL_TABLET | ORAL | Status: DC | PRN

## 2013-03-09 MED ORDER — CEFAZOLIN SODIUM-DEXTROSE 2-3 GM-% IV SOLR
2.0000 g | INTRAVENOUS | Status: AC
  Administered 2013-03-09: 2 g via INTRAVENOUS

## 2013-03-09 MED ORDER — OXYCODONE HCL 5 MG/5ML PO SOLN: 5.0000 mg | Freq: Once | ORAL | Status: DC | PRN

## 2013-03-09 MED ORDER — HYDROMORPHONE HCL PF 1 MG/ML IJ SOLN: 0.2500 mg | INTRAMUSCULAR | Status: DC | PRN

## 2013-03-09 MED ORDER — OXYCODONE HCL 5 MG PO TABS: 5.0000 mg | ORAL_TABLET | Freq: Once | ORAL | Status: DC | PRN

## 2013-03-09 MED ORDER — LIDOCAINE HCL (CARDIAC) 20 MG/ML IV SOLN
INTRAVENOUS | Status: DC | PRN
  Administered 2013-03-09: 60 mg via INTRAVENOUS

## 2013-03-09 SURGICAL SUPPLY — 40 items
ADH SKN CLS APL DERMABOND .7 (GAUZE/BANDAGES/DRESSINGS) ×1
BAG DECANTER FOR FLEXI CONT (MISCELLANEOUS) ×2 IMPLANT
BLADE HEX COATED 2.75 (ELECTRODE) ×2 IMPLANT
BLADE SURG 11 STRL SS (BLADE) ×2 IMPLANT
BLADE SURG 15 STRL LF DISP TIS (BLADE) ×1 IMPLANT
BLADE SURG 15 STRL SS (BLADE) ×2
CHLORAPREP W/TINT 26ML (MISCELLANEOUS) ×2 IMPLANT
CLOTH BEACON ORANGE TIMEOUT ST (SAFETY) ×2 IMPLANT
COVER MAYO STAND STRL (DRAPES) ×2 IMPLANT
COVER TABLE BACK 60X90 (DRAPES) ×2 IMPLANT
DECANTER SPIKE VIAL GLASS SM (MISCELLANEOUS) IMPLANT
DERMABOND ADVANCED (GAUZE/BANDAGES/DRESSINGS) ×1
DERMABOND ADVANCED .7 DNX12 (GAUZE/BANDAGES/DRESSINGS) ×1 IMPLANT
DRAPE C-ARM 42X72 X-RAY (DRAPES) ×2 IMPLANT
DRAPE LAPAROTOMY TRNSV 102X78 (DRAPE) ×2 IMPLANT
DRAPE UTILITY XL STRL (DRAPES) ×2 IMPLANT
DRSG TEGADERM 4X4.75 (GAUZE/BANDAGES/DRESSINGS) IMPLANT
ELECT REM PT RETURN 9FT ADLT (ELECTROSURGICAL) ×2
ELECTRODE REM PT RTRN 9FT ADLT (ELECTROSURGICAL) ×1 IMPLANT
GLOVE BIO SURGEON STRL SZ 6 (GLOVE) ×2 IMPLANT
GLOVE BIO SURGEON STRL SZ 6.5 (GLOVE) ×1 IMPLANT
GLOVE BIOGEL PI IND STRL 6.5 (GLOVE) ×1 IMPLANT
GLOVE BIOGEL PI INDICATOR 6.5 (GLOVE) ×1
GOWN PREVENTION PLUS XLARGE (GOWN DISPOSABLE) ×2 IMPLANT
GOWN PREVENTION PLUS XXLARGE (GOWN DISPOSABLE) ×2 IMPLANT
KIT PORT POWER 8FR ISP CVUE (Catheter) ×1 IMPLANT
NDL HYPO 25X1 1.5 SAFETY (NEEDLE) ×1 IMPLANT
NEEDLE HYPO 25X1 1.5 SAFETY (NEEDLE) ×2 IMPLANT
PACK BASIN DAY SURGERY FS (CUSTOM PROCEDURE TRAY) ×2 IMPLANT
PENCIL BUTTON HOLSTER BLD 10FT (ELECTRODE) ×2 IMPLANT
SLEEVE SCD COMPRESS KNEE MED (MISCELLANEOUS) ×2 IMPLANT
SUT MNCRL AB 4-0 PS2 18 (SUTURE) ×2 IMPLANT
SUT PROLENE 2 0 SH DA (SUTURE) ×4 IMPLANT
SUT VIC AB 3-0 SH 27 (SUTURE) ×2
SUT VIC AB 3-0 SH 27X BRD (SUTURE) ×1 IMPLANT
SUT VICRYL 3-0 CR8 SH (SUTURE) IMPLANT
SYR 5ML LUER SLIP (SYRINGE) ×2 IMPLANT
SYR CONTROL 10ML LL (SYRINGE) ×2 IMPLANT
TOWEL OR 17X24 6PK STRL BLUE (TOWEL DISPOSABLE) ×2 IMPLANT
TOWEL OR NON WOVEN STRL DISP B (DISPOSABLE) ×2 IMPLANT

## 2013-03-09 NOTE — Telephone Encounter (Signed)
, °

## 2013-03-09 NOTE — Transfer of Care (Signed)
Immediate Anesthesia Transfer of Care Note  Patient: Ana Young  Procedure(s) Performed: Procedure(s): INSERTION PORT-A-CATH (Left)  Patient Location: PACU  Anesthesia Type:General  Level of Consciousness: awake, alert , oriented and patient cooperative  Airway & Oxygen Therapy: Patient Spontanous Breathing and Patient connected to face mask oxygen  Post-op Assessment: Report given to PACU RN and Post -op Vital signs reviewed and stable  Post vital signs: Reviewed and stable  Complications: No apparent anesthesia complications

## 2013-03-09 NOTE — Anesthesia Preprocedure Evaluation (Addendum)
Anesthesia Evaluation  Patient identified by MRN, date of birth, ID band Patient awake    Reviewed: Allergy & Precautions, H&P , NPO status , Patient's Chart, lab work & pertinent test results, reviewed documented beta blocker date and time   Airway Mallampati: I TM Distance: >3 FB Neck ROM: Full    Dental no notable dental hx. (+) Teeth Intact and Dental Advisory Given   Pulmonary neg pulmonary ROS,  breath sounds clear to auscultation  Pulmonary exam normal       Cardiovascular hypertension, Pt. on home beta blockers Rhythm:Regular Rate:Normal     Neuro/Psych  Headaches, PSYCHIATRIC DISORDERS Anxiety Depression negative neurological ROS  negative psych ROS   GI/Hepatic negative GI ROS, Neg liver ROS,   Endo/Other  negative endocrine ROS  Renal/GU negative Renal ROS  negative genitourinary   Musculoskeletal   Abdominal   Peds  Hematology negative hematology ROS (+)   Anesthesia Other Findings   Reproductive/Obstetrics negative OB ROS                          Anesthesia Physical Anesthesia Plan  ASA: II  Anesthesia Plan: General   Post-op Pain Management:    Induction: Intravenous  Airway Management Planned: LMA  Additional Equipment:   Intra-op Plan:   Post-operative Plan: Extubation in OR  Informed Consent: I have reviewed the patients History and Physical, chart, labs and discussed the procedure including the risks, benefits and alternatives for the proposed anesthesia with the patient or authorized representative who has indicated his/her understanding and acceptance.   Dental advisory given  Plan Discussed with: CRNA  Anesthesia Plan Comments:         Anesthesia Quick Evaluation

## 2013-03-09 NOTE — Interval H&P Note (Signed)
History and Physical Interval Note:  03/09/2013 8:20 AM  Ana Young  has presented today for surgery, with the diagnosis of left breast cancer  The various methods of treatment have been discussed with the patient and family. After consideration of risks, benefits and other options for treatment, the patient has consented to  Procedure(s): INSERTION PORT-A-CATH (N/A) as a surgical intervention .  The patient's history has been reviewed, patient examined, no change in status, stable for surgery.  I have reviewed the patient's chart and labs.  Questions were answered to the patient's satisfaction.     Tahari Clabaugh

## 2013-03-09 NOTE — Op Note (Signed)
PREOPERATIVE DIAGNOSIS:  Left triple negative breast cancer     POSTOPERATIVE DIAGNOSIS:  Same     PROCEDURE: Left subclavian port placement, Bard ClearVue  Power Port, MRI safe, 8-French.      SURGEON:  Almond Lint, MD      ANESTHESIA:  General   FINDINGS:  Good venous return, easy flush, and tip of the catheter and   SVC 21.5 cm.      SPECIMEN:  None.      ESTIMATED BLOOD LOSS:  Minimal.      COMPLICATIONS:  None known.      PROCEDURE:  Pt was identified in the holding area and taken to   the operating room, where patient was placed supine on the operating room   table.  General anesthesia was induced.  Patient's arms were tucked and the upper   chest and neck were prepped and draped in sterile fashion.  Time-out was   performed according to the surgical safety check list.  When all was   correct, we continued.   Local anesthetic was administered over this   area at the angle of the clavicle.  The vein was accessed with 1 pass of the needle. There was good venous return and the wire passed easily with no ectopy.   Fluoroscopy was used to confirm that the wire was in the vena cava.      The patient was placed back level and the area for the pocket was anethetized   with local anesthetic.  A 3-cm transverse incision was made with a #15   blade.  Cautery was used to divide the subcutaneous tissues down to the   pectoralis muscle.  An [employer] retractor was used to elevate the skin   while a pocket was created on top of the pectoralis fascia.  The port   was placed into the pocket to confirm that it was of adequate size.  The   catheter was preattached to the port.  The port was then secured to the   pectoralis fascia with four 2-0 Prolene sutures.  These were clamped and   not tied down yet.    The catheter was tunneled through to the wire exit   site.  The catheter was placed along the wire to determine what length it should be to be in the SVC.  The catheter was cut at 21.5  cm.  The tunneler sheath and dilator were passed over the wire and the dilator and wire were removed.  The catheter was advanced through the tunneler sheath and the tunneler sheath was pulled away.  Care was taken to keep the catheter in the tunneler sheath as this occurred. This was advanced and the tunneler sheath was removed.  There was good venous   return and easy flush of the catheter.  The Prolene sutures were tied   down to the pectoral fascia.  The skin was reapproximated using 3-0   Vicryl interrupted deep dermal sutures.    Fluoroscopy was used to re-confirm good position of the catheter.  The skin   was then closed using 4-0 Monocryl in a subcuticular fashion.  The port was flushed with concentrated heparin flush as well.  The wounds were then cleaned, dried, and dressed with Dermabond.  The patient was awakened from anesthesia and taken to the PACU in stable condition.  Needle, sponge, and instrument counts were correct.               Almond Lint, MD

## 2013-03-09 NOTE — Telephone Encounter (Signed)
Rx printed and left at front desk. Please call pt to let them know to pick it up. Thanks! --CMS

## 2013-03-09 NOTE — Anesthesia Postprocedure Evaluation (Signed)
  Anesthesia Post-op Note  Patient: Ana Young  Procedure(s) Performed: Procedure(s): INSERTION PORT-A-CATH (Left)  Patient Location: PACU  Anesthesia Type:General  Level of Consciousness: awake and alert   Airway and Oxygen Therapy: Patient Spontanous Breathing  Post-op Pain: none  Post-op Assessment: Post-op Vital signs reviewed, Patient's Cardiovascular Status Stable, Respiratory Function Stable, Patent Airway and No signs of Nausea or vomiting  Post-op Vital Signs: Reviewed and stable  Complications: No apparent anesthesia complications

## 2013-03-09 NOTE — Telephone Encounter (Signed)
Informed pt that her script and husband's were ready for pick up  Wyatt Haste, RN-BSN

## 2013-03-09 NOTE — Anesthesia Procedure Notes (Addendum)
Procedure Name: Intubation Date/Time: 03/09/2013 8:36 AM Performed by: Daylynn Stumpp D Pre-anesthesia Checklist: Patient identified, Emergency Drugs available, Suction available and Patient being monitored Patient Re-evaluated:Patient Re-evaluated prior to inductionOxygen Delivery Method: Circle System Utilized Preoxygenation: Pre-oxygenation with 100% oxygen Intubation Type: IV induction and Cricoid Pressure applied Ventilation: Mask ventilation without difficulty Laryngoscope Size: Mac and 3 Grade View: Grade I Tube type: Oral Tube size: 7.0 mm Number of attempts: 1 Airway Equipment and Method: stylet Placement Confirmation: ETT inserted through vocal cords under direct vision,  positive ETCO2 and breath sounds checked- equal and bilateral Secured at: 21 cm Tube secured with: Tape Dental Injury: Teeth and Oropharynx as per pre-operative assessment

## 2013-03-09 NOTE — H&P (View-Only) (Signed)
Chief complaint:  Left triple negative breast cancer.    HISTORY: Pt is a 52 yo F with new diagnosis of triple negative left breast cancer.  She had a palpable lump in the left axillary tail.  Mammogram/ultrasound positive for 1.4 cm +1.2 cm mass in this region.  MRI demonstrated those two lesions for a total of 2.9 cm, plus a 5 mm area just lateral in the left breast. Two additional lesions were seen on the right breast on MRI.  These have not been evaluated yet.  Pt had menarche at age 78.  She is still having periods.  She had two children, the first at age 89.  She had used OCPs.  She is not up to date with her pap smears.  She had a maternal grandmother with breast cancer at age 28.    Past Medical History  Diagnosis Date  . Depression   . Vitamin D deficiency   . Anxiety   . Seasonal allergies   . HTN (hypertension)   . Insomnia   . History of migraines     Past Surgical History  Procedure Laterality Date  . Bunionectomy Left 2009    Current Outpatient Prescriptions  Medication Sig Dispense Refill  . ALPRAZolam (XANAX) 1 MG tablet Take 1 mg by mouth 2 (two) times daily as needed for sleep (1/2 tablet bid as needed).      Marland Kitchen buPROPion (WELLBUTRIN XL) 300 MG 24 hr tablet TAKE 1 TABLET (300 MG TOTAL) BY MOUTH EVERY MORNING.  30 tablet  3  . ergocalciferol (VITAMIN D2) 50000 UNITS capsule Take 1 capsule (50,000 Units total) by mouth once a week. x 8 weeks  8 capsule  0  . ibuprofen (ADVIL,MOTRIN) 800 MG tablet TAKE 1 TABLET (800 MG TOTAL) BY MOUTH EVERY 8 (EIGHT) HOURS AS NEEDED FOR PAIN.  30 tablet  2  . propranolol ER (INDERAL LA) 120 MG 24 hr capsule TAKE 1 CAPSULE (120 MG TOTAL) BY MOUTH DAILY.  93 capsule  1  . zolpidem (AMBIEN) 5 MG tablet TAKE 1 TABLET BY MOUTH AT BEDTIME AS NEEDED FOR SLEEP  30 tablet  0   No current facility-administered medications for this visit.     No Known Allergies   Family History  Problem Relation Age of Onset  . Heart attack Brother       History   Social History  . Marital Status: Married    Spouse Name: N/A    Number of Children: N/A  . Years of Education: N/A   Social History Main Topics  . Smoking status: Never Smoker   . Smokeless tobacco: Not on file  . Alcohol Use: No  . Drug Use: No  . Sexual Activity: Not on file   Other Topics Concern  . Not on file   Social History Narrative  . Kickboxes.       REVIEW OF SYSTEMS - PERTINENT POSITIVES ONLY: 12 point review of systems negative other than HPI and PMH  EXAM: Wt Readings from Last 3 Encounters:  03/02/13 148 lb 3.2 oz (67.223 kg)  01/18/13 151 lb 8 oz (68.72 kg)  02/02/12 163 lb (73.936 kg)   Temp Readings from Last 3 Encounters:  03/02/13 98.4 F (36.9 C) Oral  01/18/13 97.9 F (36.6 C) Oral  08/16/10 98.5 F (36.9 C) Oral   BP Readings from Last 3 Encounters:  03/02/13 146/90  01/18/13 148/92  02/02/12 132/88   Pulse Readings from Last 3 Encounters:  03/02/13 79  01/18/13 60  02/02/12 58    Gen:  No acute distress.  Well nourished and well groomed.  Very good shape.   Neurological: Alert and oriented to person, place, and time. Coordination normal.  Head: Normocephalic and atraumatic.  Eyes: Conjunctivae are normal. Pupils are equal, round, and reactive to light. No scleral icterus.  Neck: Normal range of motion. Neck supple. No tracheal deviation or thyromegaly present.  Cardiovascular: Normal rate, regular rhythm, normal heart sounds and intact distal pulses.  Exam reveals no gallop and no friction rub.  No murmur heard. Respiratory: Effort normal.  No respiratory distress. No chest wall tenderness. Breath sounds normal.  No wheezes, rales or rhonchi.  Breast:  Breasts are symmetric bilaterally.  Small.  No nipple retraction or skin dimpling.  Palpable mass at 2 o'clock in tail of breast.  Firm, mobile.  2 cm.  No palpable masses on left.   GI: Soft. Bowel sounds are normal. The abdomen is soft and nontender.  There is no  rebound and no guarding.  Musculoskeletal: Normal range of motion. Extremities are nontender.  Lymphadenopathy: No cervical, preauricular, postauricular or axillary adenopathy is present Skin: Skin is warm and dry. No rash noted. No diaphoresis. No erythema. No pallor. No clubbing, cyanosis, or edema.   Psychiatric: Normal mood and affect. Behavior is normal. Judgment and thought content normal.    LABORATORY RESULTS: Available labs are reviewed  CMET, CBC normal other than mild hypokalemia.    RADIOLOGY RESULTS: See E-Chart or I-Site for most recent results.  Images and reports are reviewed. MRI :  1. 2.9 cm irregular enhancing mass located within the lower  axillary portion of the left breast corresponding to the recently  biopsied mass and adjacent satellite nodule (which contains clip  artifact).  2. 1 cm irregular enhancing mass located 5 mm lateral to the known  carcinoma worrisome for an additional satellite nodule versus a  metastatic intramammary lymph node.  3. 9 x 7 x 6 mm irregular enhancing focus with an additional 9 x  6 x 5 mm area of clumped linear enhancement located medial to this  as discussed above. Both of these areas are worrisome for possible  DCIS and MR guided biopsy is recommended.  RECOMMENDATION:  Right breast MR guided biopsies as discussed above.  Mammo Targeted ultrasound at the site of palpable concern was performed.  There is a hypoechoic oval mass with internal echoes in the left  breast at 1 o'clock 6 cm from the nipple measuring 1.3 x 1 x 1.4  cm.  IMPRESSION:  Likely minimally complicated cyst in the left breast at the site of  palpable concern.  RECOMMENDATION:  Recommend ultrasound guided cyst aspiration/biopsy of the probable  cyst in the left breast. This has been scheduled for 02/22/2013 at  01:00 a.m.  I have discussed the findings and recommendations with the patient.  Results were also provided in writing at the conclusion of the   visit. If applicable, a reminder letter will be sent to the  patient regarding the next appointment.  BI-RADS CATEGORY 4: Suspicious abnormality - biopsy should be  considered.     ASSESSMENT AND PLAN: Breast cancer of upper-outer quadrant of left female breast Pt has triple negative disease.    She also needs additional biopsies on left and right.  These are scheduled for Friday. We will plan port a cath placement and neoadjuvant chemotherapy.    Surgical plan will depend on outcome of biopsies.   If  the third nodule is malignant, she will probably need left sided mastectomy.   If the right sided nodules are positive, she will need surgery on that side as well.  The right side is more conducive to lumpectomy.  I discussed risks of port a cath including bleeding, infection, pain, pneumothorax, hemothorax, risk of malposition or infection.    We will schedule this next week.    30 min spent in visit, >50% counseling.    She will also be referred for genetics.     Maudry Diego MD Surgical Oncology, General and Endocrine Surgery [facility]      Visit Diagnoses: 1. Breast cancer of upper-outer quadrant of left female breast     Primary Care Physician: Maryjean Ka, MD

## 2013-03-10 ENCOUNTER — Encounter (HOSPITAL_BASED_OUTPATIENT_CLINIC_OR_DEPARTMENT_OTHER): Payer: Self-pay | Admitting: General Surgery

## 2013-03-10 LAB — POCT HEMOGLOBIN-HEMACUE: Hemoglobin: 14.8 g/dL (ref 12.0–15.0)

## 2013-03-11 ENCOUNTER — Other Ambulatory Visit: Payer: Self-pay | Admitting: Physician Assistant

## 2013-03-11 ENCOUNTER — Ambulatory Visit
Admission: RE | Admit: 2013-03-11 | Discharge: 2013-03-11 | Disposition: A | Discharge status: Home / Self Care | Payer: BC Managed Care – PPO | Source: Ambulatory Visit | Attending: Family Medicine | Admitting: Family Medicine

## 2013-03-11 DIAGNOSIS — R928 Other abnormal and inconclusive findings on diagnostic imaging of breast: Secondary | ICD-10-CM

## 2013-03-11 DIAGNOSIS — C50412 Malignant neoplasm of upper-outer quadrant of left female breast: Secondary | ICD-10-CM

## 2013-03-14 ENCOUNTER — Telehealth: Payer: Self-pay | Admitting: Physician Assistant

## 2013-03-14 ENCOUNTER — Other Ambulatory Visit (HOSPITAL_BASED_OUTPATIENT_CLINIC_OR_DEPARTMENT_OTHER): Payer: BC Managed Care – PPO | Admitting: Lab

## 2013-03-14 ENCOUNTER — Encounter: Payer: Self-pay | Admitting: Oncology

## 2013-03-14 ENCOUNTER — Ambulatory Visit (HOSPITAL_BASED_OUTPATIENT_CLINIC_OR_DEPARTMENT_OTHER): Payer: BC Managed Care – PPO | Admitting: Physician Assistant

## 2013-03-14 ENCOUNTER — Encounter: Payer: Self-pay | Admitting: Physician Assistant

## 2013-03-14 VITALS — BP 145/90 | HR 61 | Temp 98.2°F | Resp 20 | Ht 66.0 in | Wt 148.0 lb

## 2013-03-14 DIAGNOSIS — G4700 Insomnia, unspecified: Secondary | ICD-10-CM

## 2013-03-14 DIAGNOSIS — C50412 Malignant neoplasm of upper-outer quadrant of left female breast: Secondary | ICD-10-CM

## 2013-03-14 DIAGNOSIS — C50419 Malignant neoplasm of upper-outer quadrant of unspecified female breast: Secondary | ICD-10-CM

## 2013-03-14 LAB — COMPREHENSIVE METABOLIC PANEL (CC13)
Alkaline Phosphatase: 99 U/L (ref 40–150)
BUN: 9.9 mg/dL (ref 7.0–26.0)
CO2: 30 mEq/L — ABNORMAL HIGH (ref 22–29)
Creatinine: 0.8 mg/dL (ref 0.6–1.1)
Glucose: 72 mg/dl (ref 70–140)
Sodium: 142 mEq/L (ref 136–145)
Total Bilirubin: 0.36 mg/dL (ref 0.20–1.20)

## 2013-03-14 LAB — CBC WITH DIFFERENTIAL/PLATELET
Basophils Absolute: 0 10*3/uL (ref 0.0–0.1)
Eosinophils Absolute: 0 10*3/uL (ref 0.0–0.5)
HCT: 41.6 % (ref 34.8–46.6)
LYMPH%: 44.5 % (ref 14.0–49.7)
MCV: 86.8 fL (ref 79.5–101.0)
MONO#: 0.3 10*3/uL (ref 0.1–0.9)
MONO%: 8.2 % (ref 0.0–14.0)
NEUT#: 2 10*3/uL (ref 1.5–6.5)
NEUT%: 45.8 % (ref 38.4–76.8)
Platelets: 288 10*3/uL (ref 145–400)
RBC: 4.8 10*6/uL (ref 3.70–5.45)
WBC: 4.3 10*3/uL (ref 3.9–10.3)

## 2013-03-14 MED ORDER — LORAZEPAM 0.5 MG PO TABS: 0.5000 mg | ORAL_TABLET | Freq: Every evening | ORAL | Status: DC | PRN

## 2013-03-14 MED ORDER — DEXAMETHASONE 4 MG PO TABS: ORAL_TABLET | ORAL | Status: DC

## 2013-03-14 MED ORDER — PROCHLORPERAZINE MALEATE 10 MG PO TABS: ORAL_TABLET | ORAL | Status: DC

## 2013-03-14 NOTE — Telephone Encounter (Signed)
, °

## 2013-03-14 NOTE — Progress Notes (Signed)
ID: [name] OB: 08-26-1960  MR#: [id number]  CSN#:[id number]  PCP: Maryjean Ka, MD GYN:   SU: Almond Lint OTHER MD: Lurline Hare, Paula Compton   HISTORY OF PRESENT ILLNESS: Ana Young herself palpated a mass in her left breast 01/15/2013 and brought it to the attention of Dr. Mauricio Po, who arranged for her to have bilateral digital mammography at the breast Center 01/27/2013. The patient does have heterogeneously dense breasts. In the outer upper left breast there was an oval mass by mammography measuring approximately 1.5 cm, which by palpation was firm and measured approximately 1 cm. Ultrasound showed this to be hypoechoic and to measure 1.4 cm.  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  The patient then underwent bilateral breast MRI 02/28/2013. In the left breast upper-outer quadrant the mass in question measured 2.9 cm. There was a second mass lateral to it measuring 1.0 cm. There were no additional abnormal appearing lymph nodes and no evidence of internal mammary adenopathy.  In the right breast there were 2 areas of irregular enhancement in the superior central third of the breast, both measuring 9 mm. These were felt to be worrisome for a ductal carcinoma in situ.  The patient's case was discussed at the multidisciplinary breast cancer conference 03/02/2013. Her subsequent history is as detailed below.  INTERVAL HISTORY: Ana Young returns today accompanied by her mother and father for followup of her locally advanced left breast carcinoma. She is ready to initiate neoadjuvant chemotherapy, and is scheduled for her first dose dense cycle of doxorubicin/cyclophosphamide tomorrow, 03/15/2013. She is here today to review her treatment plan discuss her antinausea regimen.  Interval history is notable for the fact that Ana Young has had  biopsies in both the right and left breast since her last appointment here. Fortunately, the right breast biopsy showed only fibrocystic changes, with no evidence of malignancy. The left breast biopsy obtained last Friday, September 5, showed evidence of high-grade malignancy, and prognostic panel is pending.    REVIEW OF SYSTEMS: Ana Young herself is slightly nervous today. Her biggest concern is the fact that she will be losing her hair, so we did spend some time discussing this today. Physically, she has no new complaints. She's had no fevers or chills. Her energy level is fairly good as is her appetite, and she denies any nausea, emesis, or recent change in bowel or bladder habits. She's had no signs of abnormal bruising or bleeding, and denies any skin changes. She denies any cough, shortness of breath, orthopnea, or chest pain. She's had no abnormal headaches, dizziness or change in vision, and also denies any current myalgias, arthralgias, or bony pain. Of note, her last menstrual cycle was 02/16/2013.   A detailed review of systems is otherwise stable and noncontributory.    PAST MEDICAL HISTORY: Past Medical History  Diagnosis Date  . Depression   . Vitamin D deficiency   . Anxiety   . Seasonal allergies   . HTN (hypertension)   . Insomnia   . History of migraines   . Cancer     Left breast cancer    PAST SURGICAL HISTORY: Past Surgical History  Procedure Laterality Date  . Bunionectomy Left 2009  . Portacath placement Left 03/09/2013    Procedure: INSERTION PORT-A-CATH;  Surgeon: Almond Lint, MD;  Location: [facility];  Service: General;  Laterality: Left;    FAMILY HISTORY  Family History  Problem Relation Age of Onset  . Heart attack Brother    the patient's parents are in their 52s. The patient has one brother, no sisters. The patient's maternal grandmother was diagnosed with breast cancer the age of 46. There is no other history of breast cancer in the  family to the patient's knowledge, and no history of ovarian cancer  GYNECOLOGIC HISTORY:  Menarche age 80, first live birth age 77, the patient is GX P2. Her periods are not irregular, but still every 2-3 months, and otherwise unremarkable.  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 Her husband [name] works as a Actuary. Son Ana Young lives in [place] and works in Engineering geologist. Daughter Ana Young is 10 year old and at home. The patient has no grandchildren. She attends the [employer]    ADVANCED DIRECTIVES: Not in place  HEALTH MAINTENANCE: History  Substance Use Topics  . Smoking status: Never Smoker   . Smokeless tobacco: Never Used  . Alcohol Use: No     Colonoscopy: -  PAP: Feb 2012, Dr. Jeanice Lim  Bone density: -  Lipid panel:  Dec 2012, Dr. Ashley Royalty  No Known Allergies  Current Outpatient Prescriptions  Medication Sig Dispense Refill  . ALPRAZolam (XANAX) 1 MG tablet Take 1 mg by mouth 2 (two) times daily as needed for sleep (1/2 tablet bid as needed).      Marland Kitchen buPROPion (WELLBUTRIN XL) 300 MG 24 hr tablet TAKE 1 TABLET (300 MG TOTAL) BY MOUTH EVERY MORNING.  30 tablet  3  . ibuprofen (ADVIL,MOTRIN) 800 MG tablet TAKE 1 TABLET (800 MG TOTAL) BY MOUTH EVERY 8 (EIGHT) HOURS AS NEEDED FOR PAIN.  30 tablet  2  . lidocaine-prilocaine (EMLA) cream Apply topically as needed. For chemotherapy  30 g  0  . oxyCODONE-acetaminophen (ROXICET) 5-325 MG per tablet Take 1-2 tablets by mouth every 4 (four) hours as needed for pain.  30 tablet  0  . propranolol ER (INDERAL LA) 120 MG 24 hr capsule TAKE 1 CAPSULE (120 MG TOTAL) BY MOUTH DAILY.  93 capsule  1  . zolpidem (AMBIEN) 5 MG tablet TAKE 1 TABLET BY MOUTH AT BEDTIME AS NEEDED FOR SLEEP  30 tablet  0  . dexamethasone (DECADRON) 4 MG tablet 2 tabs by mouth with food, twice daily x 3 days after each chemo   30 tablet  1  . ergocalciferol (VITAMIN D2) 50000 UNITS capsule Take 1 capsule (50,000 Units total) by mouth once a week. x 8 weeks  8 capsule  0  . LORazepam (ATIVAN) 0.5 MG tablet Take 1 tablet (0.5 mg total) by mouth at bedtime as needed for anxiety (Nausea or vomiting).  30 tablet  0  . prochlorperazine (COMPAZINE) 10 MG tablet 1 tab by mouth with meals and bedtime x 3 days after chemo, then 1 tab by mouth every 6 hrs PRN nausea  30 tablet  2   No current facility-administered medications for this visit.    OBJECTIVE: Middle-aged [place] American woman who appears stated age and is in no acute distress Filed Vitals:   03/14/13 1432  BP: 145/90  Pulse: 61  Temp: 98.2 F (36.8 C)  Resp: 20     Body mass index is 23.9 kg/(m^2).    ECOG FS: 0 Filed Weights   03/14/13 1432  Weight: 148 lb (67.132 kg)  Sclerae unicteric Oropharynx clear No cervical or supraclavicular adenopathy Lungs clear to auscultation bilaterally with good excursion, no rales or rhonchi Heart regular rate and rhythm Abd soft, nontender to palpation, positive bowel sounds MSK no focal spinal tenderness, no peripheral edema Neuro: non-focal, well-oriented, nervous affect Breasts: There is palpable swelling in the right breast status post recent biopsy with mild ecchymoses, but no suspicious masses are palpated. There is no evidence of infection.  On the left, in the far lateral portion of the breast, there is an easily palpated 1-1.5 cm movable mass.  Axillae are benign bilaterally no palpable adenopathy Port is intact the left upper chest wall, well-healing incision, with no erythema or edema.   LAB RESULTS:  Lab Results  Component Value Date   WBC 4.3 03/14/2013   NEUTROABS 2.0 03/14/2013   HGB 13.6 03/14/2013   HCT 41.6 03/14/2013   MCV 86.8 03/14/2013   PLT 288 03/14/2013      Chemistry      Component Value Date/Time   NA 142 03/14/2013 1328   NA 138 06/16/2011 0938   K 3.9 03/14/2013 1328   K 4.1 06/16/2011  0938   CL 110 06/16/2011 0938   CO2 30* 03/14/2013 1328   CO2 26 06/16/2011 0938   BUN 9.9 03/14/2013 1328   BUN 10 06/16/2011 0938   CREATININE 0.8 03/14/2013 1328   CREATININE 0.81 06/16/2011 0938   CREATININE 0.74 07/26/2010 2332      Component Value Date/Time   CALCIUM 10.0 03/14/2013 1328   CALCIUM 9.5 06/16/2011 0938   ALKPHOS 99 03/14/2013 1328   ALKPHOS 78 06/16/2011 0938   AST 13 03/14/2013 1328   AST 13 06/16/2011 0938   ALT 13 03/14/2013 1328   ALT 10 06/16/2011 0938   BILITOT 0.36 03/14/2013 1328   BILITOT 0.5 06/16/2011 0938      STUDIES:  Echocardiogram on 03/08/2013 showed an ejection fraction of 55-60%.    Mr Breast Bilateral W Wo Contrast  03/01/2013   *RADIOLOGY REPORT*  Clinical Data:Recently diagnosed left breast invasive mammary carcinoma.  Preoperative evaluation.  BUN and creatinine were obtained on site at [facility] at [address] Results:  BUN 9 mg/dL,  Creatinine 0.9 mg/dL.  BILATERAL BREAST MRI WITH AND WITHOUT CONTRAST  Technique: Multiplanar, multisequence MR images of both breasts were obtained prior to and following the intravenous administration of 14ml of Multihance.  THREE-DIMENSIONAL MR IMAGE RENDERING ON INDEPENDENT WORKSTATION: Three-dimensional MR images were rendered by post-processing  of the original MR data on an independent DynaCad workstation. The three-dimensional MR images were interpreted, and findings are reported in the following complete MRI report for this study.  Comparison:  Recent imaging examinations.  FINDINGS:  Breast composition:  c:  Heterogeneous fibroglandular tissue  Background parenchymal enhancement: Moderate.  Right breast:  Within the right breast there is an area of irregular enhancement located within the superior central third of the breast which measures 7 x 6 x 9 mm in size.  This is associated with plateau enhancement.  This is worrisome for possible DCIS. Located 1.5 cm medial to this area of enhancement is a  similar 9 x 6 x 5 mm irregular area of clumped linear enhancement also worrisome for possible DCIS.  MR guided biopsy of these areas is recommended.  There are several small, oval, circumscribed enhancing nodules located laterally within the upper-outer quadrant of the right breast which correspond to mammographically stable intramammary lymph nodes.  Left breast:  There is  an irregular enhancing mass located within the upper-outer quadrant of the left breast (lower axillary region) with associated clip artifact. Immediately adjacent to this mass is a contiguous irregular enhancing mass with clip artifact related to the patient's recent clip placement.  In aggregate, the area of enhancement measures 2.9 x 2.1 x 2.0 cm in size.  Located 5 mm lateral to this mass is an additional 10 x 6 x 9 mm irregular enhancing mass worrisome for an additional satellite nodule or low axillary metastatic lymph node.  There are no additional worrisome enhancing foci within the left breast.  Lymph nodes:  No additional abnormal appearing lymph nodes and no evidence for internal mammary adenopathy.  Ancillary findings:  No additional findings  IMPRESSION:  1.  2.9 cm irregular enhancing mass located within the lower axillary portion of the left breast corresponding to the recently biopsied mass and adjacent satellite nodule (which contains clip artifact). 2.  1 cm irregular enhancing mass located 5 mm lateral to the known carcinoma worrisome for an additional satellite nodule versus a metastatic intramammary lymph node. 3.  9 x 7 x 6 mm  irregular enhancing focus with an additional 9 x 6 x 5 mm area of clumped linear enhancement located medial to this as discussed above.  Both of these areas are worrisome for possible DCIS and MR guided biopsy is recommended.  RECOMMENDATION: Right breast MR guided biopsies as discussed above.  BI-RADS CATEGORY 4:  Suspicious abnormality - biopsy should be considered.   Original Report Authenticated By:  Rolla Plate, M.D.      ASSESSMENT: 52 y.o. Ana Young woman   (1)  status post left breast upper outer quadrant biopsy 02/22/2013 for a clinical T2 N0, stage IIA invasive ductal carcinoma, grade 3, triple negative, with an MIB-1 of 100%.    (2) MRI on 03/02/2013 showed an additional nodule at the 2:00 position, 5 mm lateral to the primary in the left breast, measuring 10 x 6 x 9 mm. This was biopsied on 03/11/2013 and was consistent with high-grade carcinoma [phone]). Prognostic panel is pending.   (3) MRI on 03/02/2013 also showed 2 areas of irregularity in the right breast which were worrisome for possible DCIS. Both of these areas were biopsied on 03/04/2013, and both showed fibrocystic changes with no evidence of malignancy ([id number]).   (4) genetics testing is pending  (5)  Being treated in the neoadjuvant setting, the plan being to treat with 4 dose dense cycles of doxorubicin/cyclophosphamide, with the first dose given on 03/15/2013. Patient received Neulasta on day 2 of each cycle for granulocyte support. These 4 cycles will be followed by 12 weekly doses of carboplatin/paclitaxel given in standard dose, with all neoadjuvant chemotherapy to be completed prior to definitive surgery.    PLAN: Over half of our 50 minute appointment today was spent answering  Ana Young's questions, counseling her regarding her concerns, reviewing her treatment plan, and coordinating care. We again reviewed possible side effects associated with the chemotherapy, and reviewed the scheduling protocol.   We also went over her antinausea regimen which will include: Dexamethasone (2 tablets by mouth twice daily with food x3 days following each cycle of chemotherapy); prochlorperazine (10 mg by mouth with meals and bedtime x3 days following chemotherapy, then every 6 hours only as needed for nausea); and lorazepam (0.5 mg by mouth at bedtime as needed for anxiety/insomnia/nausea).  She will not take  Xanax as long as she is taking lorazepam, and she also will avoid taking lorazepam  if she is taking her Ambien. She also has a prescription for EMLA cream and we discussed how to utilize that for her port. I advised her to take Claritin for 4-5 days after the Neulasta injection, in addition to either Tylenol or Aleve (with food). She is given all of this information in writing and voiced her understanding of how to utilize all of these medications appropriately.  Ana Young will return tomorrow as scheduled for day 1 cycle 1 of neoadjuvant doxorubicin/cyclophosphamide,  and will return on Wednesday, September 10, for Neulasta on day 2. I will plan on seeing her next week on September 16 for assessment of chemotoxicity. At that time, we will hopefully have the final pathology report along with the prognostic panel on her recent left breast biopsy.  I have also spoken with our social worker, Tamala Julian, and she will try to meet with Stanton Kidney tomorrow during infusion to go over available forms of a financial support.   Ana Young voiced understanding and agreement with the above plan. She knows to call she has any changes or questions prior to her next scheduled appointment.   Audreena Sachdeva, PA-C   03/14/2013 3:51 PM

## 2013-03-14 NOTE — Progress Notes (Signed)
Put disability paper on nurse's desk. °

## 2013-03-15 ENCOUNTER — Ambulatory Visit (HOSPITAL_BASED_OUTPATIENT_CLINIC_OR_DEPARTMENT_OTHER): Payer: BC Managed Care – PPO

## 2013-03-15 ENCOUNTER — Encounter: Payer: Self-pay | Admitting: Oncology

## 2013-03-15 VITALS — BP 117/79 | HR 77 | Temp 98.0°F | Resp 18

## 2013-03-15 DIAGNOSIS — C50412 Malignant neoplasm of upper-outer quadrant of left female breast: Secondary | ICD-10-CM

## 2013-03-15 DIAGNOSIS — Z5111 Encounter for antineoplastic chemotherapy: Secondary | ICD-10-CM

## 2013-03-15 DIAGNOSIS — C50419 Malignant neoplasm of upper-outer quadrant of unspecified female breast: Secondary | ICD-10-CM

## 2013-03-15 MED ORDER — SODIUM CHLORIDE 0.9 % IJ SOLN
10.0000 mL | INTRAMUSCULAR | Status: DC | PRN
  Administered 2013-03-15: 10 mL
  Filled 2013-03-15: qty 10

## 2013-03-15 MED ORDER — SODIUM CHLORIDE 0.9 % IV SOLN
Freq: Once | INTRAVENOUS | Status: AC
  Administered 2013-03-15: 09:00:00 via INTRAVENOUS

## 2013-03-15 MED ORDER — SODIUM CHLORIDE 0.9 % IV SOLN
600.0000 mg/m2 | Freq: Once | INTRAVENOUS | Status: AC
  Administered 2013-03-15: 1060 mg via INTRAVENOUS
  Filled 2013-03-15: qty 53

## 2013-03-15 MED ORDER — SODIUM CHLORIDE 0.9 % IV SOLN
150.0000 mg | Freq: Once | INTRAVENOUS | Status: AC
  Administered 2013-03-15: 150 mg via INTRAVENOUS
  Filled 2013-03-15: qty 5

## 2013-03-15 MED ORDER — DEXAMETHASONE SODIUM PHOSPHATE 20 MG/5ML IJ SOLN
12.0000 mg | Freq: Once | INTRAMUSCULAR | Status: AC
  Administered 2013-03-15: 12 mg via INTRAVENOUS

## 2013-03-15 MED ORDER — DOXORUBICIN HCL CHEMO IV INJECTION 2 MG/ML
60.0000 mg/m2 | Freq: Once | INTRAVENOUS | Status: AC
  Administered 2013-03-15: 106 mg via INTRAVENOUS
  Filled 2013-03-15: qty 53

## 2013-03-15 MED ORDER — DEXAMETHASONE SODIUM PHOSPHATE 20 MG/5ML IJ SOLN
INTRAMUSCULAR | Status: AC
  Administered 2013-03-15: 12 mg via INTRAVENOUS
  Filled 2013-03-15: qty 5

## 2013-03-15 MED ORDER — PALONOSETRON HCL INJECTION 0.25 MG/5ML
0.2500 mg | Freq: Once | INTRAVENOUS | Status: AC
  Administered 2013-03-15: 0.25 mg via INTRAVENOUS

## 2013-03-15 MED ORDER — PALONOSETRON HCL INJECTION 0.25 MG/5ML
INTRAVENOUS | Status: AC
  Filled 2013-03-15: qty 5

## 2013-03-15 MED ORDER — HEPARIN SOD (PORK) LOCK FLUSH 100 UNIT/ML IV SOLN
500.0000 [IU] | Freq: Once | INTRAVENOUS | Status: AC | PRN
  Administered 2013-03-15: 500 [IU]
  Filled 2013-03-15: qty 5

## 2013-03-15 NOTE — Patient Instructions (Signed)
[facility] Discharge Instructions for Patients Receiving Chemotherapy  Today you received the following chemotherapy agents Adriamycin, Cytoxan  To help prevent nausea and vomiting after your treatment, we encourage you to take your nausea medication as needed   If you develop nausea and vomiting that is not controlled by your nausea medication, call the clinic.   BELOW ARE SYMPTOMS THAT SHOULD BE REPORTED IMMEDIATELY:  *FEVER GREATER THAN 100.5 F  *CHILLS WITH OR WITHOUT FEVER  NAUSEA AND VOMITING THAT IS NOT CONTROLLED WITH YOUR NAUSEA MEDICATION  *UNUSUAL SHORTNESS OF BREATH  *UNUSUAL BRUISING OR BLEEDING  TENDERNESS IN MOUTH AND THROAT WITH OR WITHOUT PRESENCE OF ULCERS  *URINARY PROBLEMS  *BOWEL PROBLEMS  UNUSUAL RASH Items with * indicate a potential emergency and should be followed up as soon as possible.  Feel free to call the clinic you have any questions or concerns. The clinic phone number is [phone].

## 2013-03-16 ENCOUNTER — Encounter: Payer: Self-pay | Admitting: Oncology

## 2013-03-16 ENCOUNTER — Ambulatory Visit (HOSPITAL_BASED_OUTPATIENT_CLINIC_OR_DEPARTMENT_OTHER): Payer: BC Managed Care – PPO

## 2013-03-16 ENCOUNTER — Telehealth: Payer: Self-pay | Admitting: *Deleted

## 2013-03-16 VITALS — BP 115/62 | HR 75 | Temp 98.1°F

## 2013-03-16 DIAGNOSIS — C50412 Malignant neoplasm of upper-outer quadrant of left female breast: Secondary | ICD-10-CM

## 2013-03-16 DIAGNOSIS — Z5189 Encounter for other specified aftercare: Secondary | ICD-10-CM

## 2013-03-16 DIAGNOSIS — C50419 Malignant neoplasm of upper-outer quadrant of unspecified female breast: Secondary | ICD-10-CM

## 2013-03-16 MED ORDER — PEGFILGRASTIM INJECTION 6 MG/0.6ML
6.0000 mg | Freq: Once | SUBCUTANEOUS | Status: AC
  Administered 2013-03-16: 6 mg via SUBCUTANEOUS
  Filled 2013-03-16: qty 0.6

## 2013-03-16 NOTE — Progress Notes (Signed)
Patient and hubby came in. She will bring back the bank stmt or stub for hubby. She signed the 600.00 and 400.00 grant form. I explained the details of how it works.

## 2013-03-16 NOTE — Telephone Encounter (Signed)
Ana Young here for Neulasta injection following 1st AC chemotherapy.  States that she is doing well.  No nausea, vomiting, or diarrhea.   Is drinking plenty of fluids and eating well.  Knows to call if she has any problems or concerns.

## 2013-03-16 NOTE — Patient Instructions (Addendum)

## 2013-03-16 NOTE — Progress Notes (Signed)
Faxed husband's fmla form to [place] @ [phone].

## 2013-03-17 ENCOUNTER — Encounter: Payer: Self-pay | Admitting: Oncology

## 2013-03-17 NOTE — Progress Notes (Signed)
Received bank statement. She qualifies for 600.00 and 400.00 grant--family of 2

## 2013-03-22 ENCOUNTER — Ambulatory Visit (HOSPITAL_BASED_OUTPATIENT_CLINIC_OR_DEPARTMENT_OTHER): Payer: BC Managed Care – PPO | Admitting: Physician Assistant

## 2013-03-22 ENCOUNTER — Ambulatory Visit: Payer: BC Managed Care – PPO | Admitting: Physician Assistant

## 2013-03-22 ENCOUNTER — Other Ambulatory Visit (HOSPITAL_BASED_OUTPATIENT_CLINIC_OR_DEPARTMENT_OTHER): Payer: BC Managed Care – PPO | Admitting: Lab

## 2013-03-22 ENCOUNTER — Other Ambulatory Visit: Payer: BC Managed Care – PPO | Admitting: Lab

## 2013-03-22 ENCOUNTER — Encounter: Payer: Self-pay | Admitting: Physician Assistant

## 2013-03-22 VITALS — BP 115/78 | HR 85 | Temp 98.3°F | Resp 18 | Ht 66.0 in | Wt 149.3 lb

## 2013-03-22 DIAGNOSIS — C50419 Malignant neoplasm of upper-outer quadrant of unspecified female breast: Secondary | ICD-10-CM

## 2013-03-22 DIAGNOSIS — C50412 Malignant neoplasm of upper-outer quadrant of left female breast: Secondary | ICD-10-CM

## 2013-03-22 DIAGNOSIS — D701 Agranulocytosis secondary to cancer chemotherapy: Secondary | ICD-10-CM

## 2013-03-22 DIAGNOSIS — D702 Other drug-induced agranulocytosis: Secondary | ICD-10-CM

## 2013-03-22 DIAGNOSIS — K5900 Constipation, unspecified: Secondary | ICD-10-CM

## 2013-03-22 DIAGNOSIS — T451X5A Adverse effect of antineoplastic and immunosuppressive drugs, initial encounter: Secondary | ICD-10-CM

## 2013-03-22 LAB — COMPREHENSIVE METABOLIC PANEL (CC13)
ALT: 16 U/L (ref 0–55)
CO2: 29 mEq/L (ref 22–29)
Calcium: 8.7 mg/dL (ref 8.4–10.4)
Chloride: 105 mEq/L (ref 98–109)
Creatinine: 0.7 mg/dL (ref 0.6–1.1)
Glucose: 104 mg/dl (ref 70–140)
Total Bilirubin: 0.33 mg/dL (ref 0.20–1.20)
Total Protein: 6 g/dL — ABNORMAL LOW (ref 6.4–8.3)

## 2013-03-22 LAB — CBC WITH DIFFERENTIAL/PLATELET
Basophils Absolute: 0 10*3/uL (ref 0.0–0.1)
Eosinophils Absolute: 0 10*3/uL (ref 0.0–0.5)
HCT: 34.1 % — ABNORMAL LOW (ref 34.8–46.6)
HGB: 11.4 g/dL — ABNORMAL LOW (ref 11.6–15.9)
LYMPH%: 88.9 % — ABNORMAL HIGH (ref 14.0–49.7)
MCV: 85 fL (ref 79.5–101.0)
MONO#: 0 10*3/uL — ABNORMAL LOW (ref 0.1–0.9)
MONO%: 2.5 % (ref 0.0–14.0)
NEUT#: 0.1 10*3/uL — CL (ref 1.5–6.5)
Platelets: 92 10*3/uL — ABNORMAL LOW (ref 145–400)
RBC: 4.01 10*6/uL (ref 3.70–5.45)
WBC: 0.8 10*3/uL — CL (ref 3.9–10.3)
nRBC: 0 % (ref 0–0)

## 2013-03-22 MED ORDER — POLYETHYLENE GLYCOL 3350 17 GM/SCOOP PO POWD: 17.0000 g | Freq: Every day | ORAL | Status: DC | PRN

## 2013-03-22 MED ORDER — CIPROFLOXACIN HCL 500 MG PO TABS
500.0000 mg | ORAL_TABLET | Freq: Two times a day (BID) | ORAL | Status: DC
Start: 2013-03-22 — End: 2013-05-09

## 2013-03-22 MED ORDER — DOCUSATE SODIUM 100 MG PO CAPS: 100.0000 mg | ORAL_CAPSULE | ORAL | Status: DC

## 2013-03-22 NOTE — Progress Notes (Signed)
ID: Ana Young OB: Apr 27, 1961  MR#: [id number]  CSN#:[id number]  PCP: Maryjean Ka, MD GYN:   SU: Almond Lint OTHER MD: Lurline Hare, Paula Compton   HISTORY OF PRESENT ILLNESS: Ana Young herself palpated a mass in her left breast 01/15/2013 and brought it to the attention of Dr. Mauricio Po, who arranged for her to have bilateral digital mammography at the breast Center 01/27/2013. The patient does have heterogeneously dense breasts. In the outer upper left breast there was an oval mass by mammography measuring approximately 1.5 cm, which by palpation was firm and measured approximately 1 cm. Ultrasound showed this to be hypoechoic and to measure 1.4 cm.  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  The patient then underwent bilateral breast MRI 02/28/2013. In the left breast upper-outer quadrant the mass in question measured 2.9 cm. There was a second mass lateral to it measuring 1.0 cm. There were no additional abnormal appearing lymph nodes and no evidence of internal mammary adenopathy.  In the right breast there were 2 areas of irregular enhancement in the superior central third of the breast, both measuring 9 mm. These were felt to be worrisome for a ductal carcinoma in situ.  The patient's case was discussed at the multidisciplinary breast cancer conference 03/02/2013. Her subsequent history is as detailed below.  INTERVAL HISTORY: Ana Young returns today accompanied by her husband [name] for followup of her locally advanced left breast carcinoma. She currently day 8 cycle 1 of 4 planned dose dense cycles of doxorubicin/cyclophosphamide being given in the neoadjuvant setting. She receives Neulasta on day 2 for granulocyte support.  Emerie has only a few side effects since her first dose of chemotherapy one week ago. She is more tired than usual.  She's had some blurred vision, likely associated with the dexamethasone. She's felt a little more forgetful than usual. She was constipated for several days following treatment, and this has now resolved. Fortunately, she had no problems at all with nausea or emesis. She also denies any fevers, chills, or night sweats. She feels less anxious today now that she knows "what to expect" with future treatments.   REVIEW OF SYSTEMS: Ana Young denies any rashes or skin changes. She's had no abnormal bruising or bleeding. Of note, her last menstrual cycle was 02/16/2013.  Her appetite is fairly good. She denies any cough, shortness of breath, orthopnea, or chest pain. She's had no abnormal headaches or dizziness, and also denies any current myalgias, arthralgias, or bony pain.  A detailed review of systems is otherwise stable and noncontributory.    PAST MEDICAL HISTORY: Past Medical History  Diagnosis Date  . Depression   . Vitamin D deficiency   . Anxiety   . Seasonal allergies   . HTN (hypertension)   . Insomnia   . History of migraines   . Cancer     Left breast cancer    PAST SURGICAL HISTORY: Past Surgical History  Procedure Laterality Date  . Bunionectomy Left 2009  . Portacath placement Left 03/09/2013    Procedure: INSERTION PORT-A-CATH;  Surgeon: Almond Lint, MD;  Location: [facility];  Service: General;  Laterality: Left;    FAMILY HISTORY Family History  Problem Relation Age of Onset  . Heart attack Brother    the patient's parents are in their 57s. The patient has one brother, no sisters. The patient's maternal grandmother was diagnosed with  breast cancer the age of 38. There is no other history of breast cancer in the family to the patient's knowledge, and no history of ovarian cancer  GYNECOLOGIC HISTORY:  Menarche age 78, first live birth age 49, the patient is GX P2. Her periods are not irregular, but still every 2-3 months, and otherwise  unremarkable.  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 Her husband [name] works as a Actuary. Son [name] lives in [place] and works in Engineering geologist. Daughter Kally Cadden is 43 year old and at home. The patient has no grandchildren. She attends the [employer] church    ADVANCED DIRECTIVES: Not in place  HEALTH MAINTENANCE: History  Substance Use Topics  . Smoking status: Never Smoker   . Smokeless tobacco: Never Used  . Alcohol Use: No     Colonoscopy: -  PAP: Feb 2012, Dr. Jeanice Lim  Bone density: -  Lipid panel:  Dec 2012, Dr. Ashley Royalty  No Known Allergies  Current Outpatient Prescriptions  Medication Sig Dispense Refill  . ALPRAZolam (XANAX) 1 MG tablet Take 1 mg by mouth 2 (two) times daily as needed for sleep (1/2 tablet bid as needed).      Marland Kitchen buPROPion (WELLBUTRIN XL) 300 MG 24 hr tablet TAKE 1 TABLET (300 MG TOTAL) BY MOUTH EVERY MORNING.  30 tablet  3  . dexamethasone (DECADRON) 4 MG tablet 2 tabs by mouth with food, twice daily x 3 days after each chemo  30 tablet  1  . ibuprofen (ADVIL,MOTRIN) 800 MG tablet TAKE 1 TABLET (800 MG TOTAL) BY MOUTH EVERY 8 (EIGHT) HOURS AS NEEDED FOR PAIN.  30 tablet  2  . lidocaine-prilocaine (EMLA) cream Apply topically as needed. For chemotherapy  30 g  0  . LORazepam (ATIVAN) 0.5 MG tablet Take 1 tablet (0.5 mg total) by mouth at bedtime as needed for anxiety (Nausea or vomiting).  30 tablet  0  . oxyCODONE-acetaminophen (ROXICET) 5-325 MG per tablet Take 1-2 tablets by mouth every 4 (four) hours as needed for pain.  30 tablet  0  . prochlorperazine (COMPAZINE) 10 MG tablet 1 tab by mouth with meals and bedtime x 3 days after chemo, then 1 tab by mouth every 6 hrs PRN nausea  30 tablet  2  . propranolol ER (INDERAL LA) 120 MG 24 hr capsule TAKE 1 CAPSULE (120 MG TOTAL) BY MOUTH DAILY.  93 capsule  1  . zolpidem  (AMBIEN) 5 MG tablet TAKE 1 TABLET BY MOUTH AT BEDTIME AS NEEDED FOR SLEEP  30 tablet  0  . ciprofloxacin (CIPRO) 500 MG tablet Take 1 tablet (500 mg total) by mouth 2 (two) times daily. X 7 days  14 tablet  3  . docusate sodium (COLACE) 100 MG capsule Take 1-2 capsules (100-200 mg total) by mouth every morning. As directed for constipation  30 capsule  1  . polyethylene glycol powder (MIRALAX) powder Take 17 g by mouth daily as needed (constipation).  255 g  1   No current facility-administered medications for this visit.    OBJECTIVE: Middle-aged [place] American woman who appears stated age and is in no acute distress Filed Vitals:   03/22/13 1135  BP: 115/78  Pulse: 85  Temp: 98.3 F (36.8 C)  Resp: 18     Body mass index is 24.11 kg/(m^2).    ECOG FS: 1 Filed Weights  03/22/13 1135  Weight: 149 lb 4.8 oz (67.722 kg)   Sclerae unicteric Oropharynx clear, no ulcerations, no evidence of candidiasis. No cervical or supraclavicular adenopathy Lungs clear to auscultation bilaterally with good excursion, no rales or rhonchi Heart regular rate and rhythm, no murmur appreciated Abd soft, nontender to palpation, positive bowel sounds MSK no focal spinal tenderness, no peripheral edema Neuro: non-focal, well-oriented, family affect Breasts: Deferred.  Axillae are benign bilaterally no palpable adenopathy Port is intact the left upper chest wall, well-healed incision, with no erythema or edema.   LAB RESULTS:  Lab Results  Component Value Date   WBC 0.8* 03/22/2013   NEUTROABS 0.1* 03/22/2013   HGB 11.4* 03/22/2013   HCT 34.1* 03/22/2013   MCV 85.0 03/22/2013   PLT 92* 03/22/2013      Chemistry      Component Value Date/Time   NA 139 03/22/2013 1127   NA 138 06/16/2011 0938   K 4.0 03/22/2013 1127   K 4.1 06/16/2011 0938   CL 110 06/16/2011 0938   CO2 29 03/22/2013 1127   CO2 26 06/16/2011 0938   BUN 9.6 03/22/2013 1127   BUN 10 06/16/2011 0938   CREATININE 0.7 03/22/2013 1127    CREATININE 0.81 06/16/2011 0938   CREATININE 0.74 07/26/2010 2332      Component Value Date/Time   CALCIUM 8.7 03/22/2013 1127   CALCIUM 9.5 06/16/2011 0938   ALKPHOS 95 03/22/2013 1127   ALKPHOS 78 06/16/2011 0938   AST 7 03/22/2013 1127   AST 13 06/16/2011 0938   ALT 16 03/22/2013 1127   ALT 10 06/16/2011 0938   BILITOT 0.33 03/22/2013 1127   BILITOT 0.5 06/16/2011 0938      STUDIES:  Echocardiogram on 03/08/2013 showed an ejection fraction of 55-60%.   PATHOLOGY 03/11/2013  ADDITIONAL INFORMATION: PROGNOSTIC INDICATORS - ACIS Results: IMMUNOHISTOCHEMICAL AND MORPHOMETRIC ANALYSIS BY THE AUTOMATED CELLULAR IMAGING SYSTEM (ACIS) Estrogen Receptor: 0%, NEGATIVE Progesterone Receptor: 0%, NEGATIVE Proliferation Marker Ki67: 55% COMMENT: The negative hormone receptor study(ies) in this case have an internal positive control. REFERENCE RANGE ESTROGEN RECEPTOR NEGATIVE <1% POSITIVE =>1% PROGESTERONE RECEPTOR NEGATIVE <1% POSITIVE =>1% All controls stained appropriately Ana Leisure MD Pathologist, Electronic Signature ( Signed 03/16/2013) There is insufficient invansive tumor present for Her2 analysis. Ana Leisure MD Pathologist, Electronic Signature    ASSESSMENT: 52 y.o. Ana Young woman   (1)  status post left breast upper outer quadrant biopsy 02/22/2013 for a clinical T2 N0, stage IIA invasive ductal carcinoma, grade 3, triple negative, with an MIB-1 of 100%.    (2) MRI on 03/02/2013 showed an additional nodule at the 2:00 position, 5 mm lateral to the primary in the left breast, measuring 10 x 6 x 9 mm. This was biopsied on 03/11/2013 and was consistent with high-grade carcinoma [phone]). Prognostic panel is pending.   (3) MRI on 03/02/2013 also showed 2 areas of irregularity in the right breast which were worrisome for possible DCIS. Both of these areas were biopsied on 03/04/2013, and both showed fibrocystic changes with no evidence of malignancy  ([id number]).   (4) genetics testing is pending  (5)  Being treated in the neoadjuvant setting, the plan being to treat with 4 dose dense cycles of doxorubicin/cyclophosphamide, with the first dose given on 03/15/2013. Patient receives Neulasta on day 2 of each cycle for granulocyte support. These 4 cycles will be followed by 12 weekly doses of carboplatin/paclitaxel given in standard dose, with all neoadjuvant chemotherapy to be completed prior to definitive surgery.  PLAN: Emiley has tolerated this first cycle of chemotherapy quite well. We did review neutropenic precautions today with an ANC of 100, and she is being started on Cipro prophylactically, 500 mg by mouth twice a day for the next 7 days. She also understands that she needs to call with any temperatures of 100 or above.  We also discussed the constipation she experienced following cycle one, and we will try to be a little more proactive with cycle 2. Specifically, I have recommended that she start with a stool softener, one tablet on the day of chemotherapy, then 1-2 tablets each morning for the next 3 days. If she has not had a bowel movement by the end of the day, she will add MiraLAX at bedtime. She was given all these instructions in writing.   We also reviewed her final pathology report from 03/11/2013, including the prognostic panel which again showed the tumor to be ER and PR negative.  I will see Ana Young again next week on September 23 for repeat labs and physical exam in anticipation of day 1 cycle 2 of doxorubicin/cyclophosphamide.  Everley voiced understanding and agreement with the above plan. She knows to call she has any changes or questions prior to her next scheduled appointment.   Abryana Lykens, PA-C   03/22/2013 12:19 PM

## 2013-03-23 ENCOUNTER — Telehealth: Payer: Self-pay | Admitting: Oncology

## 2013-03-23 NOTE — Telephone Encounter (Signed)
, °

## 2013-03-24 ENCOUNTER — Telehealth: Payer: Self-pay | Admitting: *Deleted

## 2013-03-24 NOTE — Telephone Encounter (Signed)
Per staff message and POF I have scheduled appts.  JMW  

## 2013-03-27 ENCOUNTER — Other Ambulatory Visit: Payer: Self-pay | Admitting: Family Medicine

## 2013-03-29 ENCOUNTER — Other Ambulatory Visit (HOSPITAL_BASED_OUTPATIENT_CLINIC_OR_DEPARTMENT_OTHER): Payer: BC Managed Care – PPO | Admitting: Lab

## 2013-03-29 ENCOUNTER — Ambulatory Visit (HOSPITAL_BASED_OUTPATIENT_CLINIC_OR_DEPARTMENT_OTHER): Payer: BC Managed Care – PPO | Admitting: Physician Assistant

## 2013-03-29 ENCOUNTER — Encounter: Payer: Self-pay | Admitting: Physician Assistant

## 2013-03-29 ENCOUNTER — Ambulatory Visit (HOSPITAL_BASED_OUTPATIENT_CLINIC_OR_DEPARTMENT_OTHER): Payer: BC Managed Care – PPO

## 2013-03-29 VITALS — BP 128/81 | HR 77 | Temp 97.6°F | Resp 18 | Ht 66.0 in | Wt 152.9 lb

## 2013-03-29 DIAGNOSIS — C50412 Malignant neoplasm of upper-outer quadrant of left female breast: Secondary | ICD-10-CM

## 2013-03-29 DIAGNOSIS — Z171 Estrogen receptor negative status [ER-]: Secondary | ICD-10-CM

## 2013-03-29 DIAGNOSIS — C50419 Malignant neoplasm of upper-outer quadrant of unspecified female breast: Secondary | ICD-10-CM

## 2013-03-29 DIAGNOSIS — D702 Other drug-induced agranulocytosis: Secondary | ICD-10-CM

## 2013-03-29 DIAGNOSIS — K5900 Constipation, unspecified: Secondary | ICD-10-CM

## 2013-03-29 DIAGNOSIS — Z5111 Encounter for antineoplastic chemotherapy: Secondary | ICD-10-CM

## 2013-03-29 LAB — CBC WITH DIFFERENTIAL/PLATELET
BASO%: 0.8 % (ref 0.0–2.0)
Eosinophils Absolute: 0.1 10*3/uL (ref 0.0–0.5)
MCHC: 33.1 g/dL (ref 31.5–36.0)
MONO#: 0.6 10*3/uL (ref 0.1–0.9)
NEUT#: 4.3 10*3/uL (ref 1.5–6.5)
RBC: 4.38 10*6/uL (ref 3.70–5.45)
WBC: 6.4 10*3/uL (ref 3.9–10.3)
lymph#: 1.4 10*3/uL (ref 0.9–3.3)
nRBC: 1 % — ABNORMAL HIGH (ref 0–0)

## 2013-03-29 MED ORDER — DEXAMETHASONE SODIUM PHOSPHATE 20 MG/5ML IJ SOLN
12.0000 mg | Freq: Once | INTRAMUSCULAR | Status: AC
  Administered 2013-03-29: 12 mg via INTRAVENOUS

## 2013-03-29 MED ORDER — DEXAMETHASONE SODIUM PHOSPHATE 20 MG/5ML IJ SOLN
INTRAMUSCULAR | Status: AC
  Filled 2013-03-29: qty 5

## 2013-03-29 MED ORDER — SODIUM CHLORIDE 0.9 % IV SOLN
Freq: Once | INTRAVENOUS | Status: AC
  Administered 2013-03-29: 11:00:00 via INTRAVENOUS

## 2013-03-29 MED ORDER — DOXORUBICIN HCL CHEMO IV INJECTION 2 MG/ML
60.0000 mg/m2 | Freq: Once | INTRAVENOUS | Status: AC
  Administered 2013-03-29: 106 mg via INTRAVENOUS
  Filled 2013-03-29: qty 53

## 2013-03-29 MED ORDER — HEPARIN SOD (PORK) LOCK FLUSH 100 UNIT/ML IV SOLN
500.0000 [IU] | Freq: Once | INTRAVENOUS | Status: AC | PRN
  Administered 2013-03-29: 500 [IU]
  Filled 2013-03-29: qty 5

## 2013-03-29 MED ORDER — PALONOSETRON HCL INJECTION 0.25 MG/5ML
0.2500 mg | Freq: Once | INTRAVENOUS | Status: AC
  Administered 2013-03-29: 0.25 mg via INTRAVENOUS

## 2013-03-29 MED ORDER — SODIUM CHLORIDE 0.9 % IV SOLN
600.0000 mg/m2 | Freq: Once | INTRAVENOUS | Status: AC
  Administered 2013-03-29: 1060 mg via INTRAVENOUS
  Filled 2013-03-29: qty 53

## 2013-03-29 MED ORDER — PALONOSETRON HCL INJECTION 0.25 MG/5ML
INTRAVENOUS | Status: AC
  Filled 2013-03-29: qty 5

## 2013-03-29 MED ORDER — SODIUM CHLORIDE 0.9 % IV SOLN
150.0000 mg | Freq: Once | INTRAVENOUS | Status: AC
  Administered 2013-03-29: 150 mg via INTRAVENOUS
  Filled 2013-03-29: qty 5

## 2013-03-29 MED ORDER — SODIUM CHLORIDE 0.9 % IJ SOLN
10.0000 mL | INTRAMUSCULAR | Status: DC | PRN
  Administered 2013-03-29: 10 mL
  Filled 2013-03-29: qty 10

## 2013-03-29 NOTE — Patient Instructions (Addendum)
[facility] Discharge Instructions for Patients Receiving Chemotherapy  Today you received the following chemotherapy agents Adriamycin and Cytoxan.  To help prevent nausea and vomiting after your treatment, we encourage you to take your nausea medication as prescribed.   If you develop nausea and vomiting that is not controlled by your nausea medication, call the clinic.   BELOW ARE SYMPTOMS THAT SHOULD BE REPORTED IMMEDIATELY:  *FEVER GREATER THAN 100.5 F  *CHILLS WITH OR WITHOUT FEVER  NAUSEA AND VOMITING THAT IS NOT CONTROLLED WITH YOUR NAUSEA MEDICATION  *UNUSUAL SHORTNESS OF BREATH  *UNUSUAL BRUISING OR BLEEDING  TENDERNESS IN MOUTH AND THROAT WITH OR WITHOUT PRESENCE OF ULCERS  *URINARY PROBLEMS  *BOWEL PROBLEMS  UNUSUAL RASH Items with * indicate a potential emergency and should be followed up as soon as possible.  Feel free to call the clinic you have any questions or concerns. The clinic phone number is [phone].    

## 2013-03-29 NOTE — Progress Notes (Signed)
ID: [name] OB: 01-13-1961  MR#: [id number]  CSN#:[id number]  PCP: Maryjean Ka, MD GYN:   SU: Almond Lint OTHER MD: Lurline Hare, Paula Compton  CHIEF COMPLAINT:  Left  Breast Cancer    HISTORY OF PRESENT ILLNESS: Ana Young herself palpated a mass in her left breast 01/15/2013 and brought it to the attention of Dr. Mauricio Po, who arranged for her to have bilateral digital mammography at the breast Center 01/27/2013. The patient does have heterogeneously dense breasts. In the outer upper left breast there was an oval mass by mammography measuring approximately 1.5 cm, which by palpation was firm and measured approximately 1 cm. Ultrasound showed this to be hypoechoic and to measure 1.4 cm.  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  The patient then underwent bilateral breast MRI 02/28/2013. In the left breast upper-outer quadrant the mass in question measured 2.9 cm. There was a second mass lateral to it measuring 1.0 cm. There were no additional abnormal appearing lymph nodes and no evidence of internal mammary adenopathy.  In the right breast there were 2 areas of irregular enhancement in the superior central third of the breast, both measuring 9 mm. These were felt to be worrisome for a ductal carcinoma in situ.  The patient's case was discussed at the multidisciplinary breast cancer conference 03/02/2013. Her subsequent history is as detailed below.  INTERVAL HISTORY: [name] returns today accompanied by her mother and her husband [name] for followup of her locally advanced left breast carcinoma. She is due for day 1 cycle 2 of 4 planned dose dense cycles of doxorubicin/cyclophosphamide being given in the neoadjuvant setting. She receives Neulasta on day 2 for granulocyte support.  Sahiti is feeling well today, and feels that she has  recovered well from her first cycle of chemotherapy. She spent most of the day yesterday "rearranging furniture" in the house. Her energy level is good. She completed her course of Cipro for chemotherapy-induced neutropenia, and had no fevers or chills. She's had no problems at all with nausea or emesis.   REVIEW OF SYSTEMS: Jason denies any rashes or skin changes. She's had no abnormal bruising or bleeding. Her last menstrual cycle was 50/13/2014.  Her appetite is good, and she is keeping herself well hydrated. She has been having regular bowel movements since her appointment here last week. She denies any cough, increased shortness of breath, orthopnea, palpitations or chest pain. She's had no abnormal headaches, change in vision, or dizziness, and also denies any current myalgias, arthralgias, or bony pain. She's had no peripheral swelling.  A detailed review of systems is otherwise stable and noncontributory.    PAST MEDICAL HISTORY: Past Medical History  Diagnosis Date  . Depression   . Vitamin D deficiency   . Anxiety   . Seasonal allergies   . HTN (hypertension)   . Insomnia   . History of migraines   . Cancer     Left breast cancer    PAST SURGICAL HISTORY: Past Surgical History  Procedure Laterality Date  . Bunionectomy Left 2009  . Portacath placement Left 03/09/2013    Procedure: INSERTION PORT-A-CATH;  Surgeon: Almond Lint, MD;  Location: [facility];  Service: General;  Laterality: Left;    FAMILY HISTORY Family History  Problem Relation Age of Onset  . Heart attack Brother    the patient's parents are in their 67s. The  patient has one brother, no sisters. The patient's maternal grandmother was diagnosed with breast cancer the age of 2. There is no other history of breast cancer in the family to the patient's knowledge, and no history of ovarian cancer  GYNECOLOGIC HISTORY:  Menarche age 52, first live birth age 11, the patient is GX P2. Her periods  are not irregular, but still every 2-3 months, and otherwise unremarkable.  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 Her husband [name] works as a Actuary. Son [name] lives in [place] and works in Engineering geologist. Daughter Deslyn Cavenaugh is 2 year old and at home. The patient has no grandchildren. She attends the [employer] church    ADVANCED DIRECTIVES: Not in place  HEALTH MAINTENANCE: History  Substance Use Topics  . Smoking status: Never Smoker   . Smokeless tobacco: Never Used  . Alcohol Use: No     Colonoscopy: -  PAP: Feb 2012, Dr. Jeanice Lim  Bone density: -  Lipid panel:  Dec 2012, Dr. Ashley Royalty  No Known Allergies  Current Outpatient Prescriptions  Medication Sig Dispense Refill  . ALPRAZolam (XANAX) 1 MG tablet Take 1 mg by mouth 2 (two) times daily as needed for sleep (1/2 tablet bid as needed).      Marland Kitchen buPROPion (WELLBUTRIN XL) 300 MG 24 hr tablet TAKE 1 TABLET (300 MG TOTAL) BY MOUTH EVERY MORNING.  30 tablet  3  . dexamethasone (DECADRON) 4 MG tablet 2 tabs by mouth with food, twice daily x 3 days after each chemo  30 tablet  1  . docusate sodium (COLACE) 100 MG capsule Take 1-2 capsules (100-200 mg total) by mouth every morning. As directed for constipation  30 capsule  1  . ibuprofen (ADVIL,MOTRIN) 800 MG tablet TAKE 1 TABLET (800 MG TOTAL) BY MOUTH EVERY 8 (EIGHT) HOURS AS NEEDED FOR PAIN.  30 tablet  2  . lidocaine-prilocaine (EMLA) cream Apply topically as needed. For chemotherapy  30 g  0  . LORazepam (ATIVAN) 0.5 MG tablet Take 1 tablet (0.5 mg total) by mouth at bedtime as needed for anxiety (Nausea or vomiting).  30 tablet  0  . oxyCODONE-acetaminophen (ROXICET) 5-325 MG per tablet Take 1-2 tablets by mouth every 4 (four) hours as needed for pain.  30 tablet  0  . polyethylene glycol powder (MIRALAX) powder Take 17 g  by mouth daily as needed (constipation).  255 g  1  . prochlorperazine (COMPAZINE) 10 MG tablet 1 tab by mouth with meals and bedtime x 3 days after chemo, then 1 tab by mouth every 6 hrs PRN nausea  30 tablet  2  . propranolol ER (INDERAL LA) 120 MG 24 hr capsule TAKE 1 CAPSULE (120 MG TOTAL) BY MOUTH DAILY.  93 capsule  1  . zolpidem (AMBIEN) 5 MG tablet TAKE 1 TABLET BY MOUTH AT BEDTIME AS NEEDED FOR SLEEP  30 tablet  0  . ciprofloxacin (CIPRO) 500 MG tablet Take 1 tablet (500 mg total) by mouth 2 (two) times daily. X 7 days  14 tablet  3   No current facility-administered medications for this visit.    OBJECTIVE: Middle-aged [place] American woman who appears stated age and is in no acute distress Filed Vitals:   03/29/13 0933  BP: 128/81  Pulse: 77  Temp: 97.6 F (36.4 C)  Resp: 18  Body mass index is 24.69 kg/(m^2).    ECOG FS: 1 Filed Weights   03/29/13 0933  Weight: 152 lb 14.4 oz (69.355 kg)   Physical Exam: HEENT:  Sclerae anicteric.  Oropharynx clear. No evidence of ulcerations or mucositis. Good dentition. NODES:  No cervical or supraclavicular lymphadenopathy palpated.  BREAST EXAM:  In the right breast, there is still some residual swelling status post recent biopsy, as well as some residual ecchymosis in the inner portion of the right breast. Otherwise, the breast is unremarkable. On the left side, I found it difficult to palpate a distinct measurable mass today. There was some palpable thickening approximately 1 cm in diameter along the lateral edge of the breast.  Axillae are benign, with no palpable adenopathy. LUNGS:  Clear to auscultation bilaterally.  No wheezes or rhonchi HEART:  Regular rate and rhythm. No murmur appreciated. ABDOMEN:  Soft, nontender.  Positive bowel sounds.  MSK:  No focal spinal tenderness to palpation.  EXTREMITIES:  No peripheral edema.   SKIN: Port is intact in the left upper chest wall with no erythema, edema, or evidence of infection.    Neuro: Nonfocal.  Well oriented.  Positive  affect.    LAB RESULTS:  Lab Results  Component Value Date   WBC 6.4 03/29/2013   NEUTROABS 4.3 03/29/2013   HGB 12.4 03/29/2013   HCT 37.5 03/29/2013   MCV 85.6 03/29/2013   PLT 252 03/29/2013      Chemistry      Component Value Date/Time   NA 139 03/22/2013 1127   NA 138 06/16/2011 0938   K 4.0 03/22/2013 1127   K 4.1 06/16/2011 0938   CL 110 06/16/2011 0938   CO2 29 03/22/2013 1127   CO2 26 06/16/2011 0938   BUN 9.6 03/22/2013 1127   BUN 10 06/16/2011 0938   CREATININE 0.7 03/22/2013 1127   CREATININE 0.81 06/16/2011 0938   CREATININE 0.74 07/26/2010 2332      Component Value Date/Time   CALCIUM 8.7 03/22/2013 1127   CALCIUM 9.5 06/16/2011 0938   ALKPHOS 95 03/22/2013 1127   ALKPHOS 78 06/16/2011 0938   AST 7 03/22/2013 1127   AST 13 06/16/2011 0938   ALT 16 03/22/2013 1127   ALT 10 06/16/2011 0938   BILITOT 0.33 03/22/2013 1127   BILITOT 0.5 06/16/2011 0938      STUDIES:  Echocardiogram on 03/08/2013 showed an ejection fraction of 55-60%.     ASSESSMENT: 52 y.o. Ana Young woman   (1)  status post left breast upper outer quadrant biopsy 02/22/2013 for a clinical T2 N0, stage IIA invasive ductal carcinoma, grade 3, triple negative, with an MIB-1 of 100%.    (2) MRI on 03/02/2013 showed an additional nodule at the 2:00 position, 5 mm lateral to the primary in the left breast, measuring 10 x 6 x 9 mm. This was biopsied on 03/11/2013 and was consistent with high-grade carcinoma [phone]). Prognostic panel is pending.   (3) MRI on 03/02/2013 also showed 2 areas of irregularity in the right breast which were worrisome for possible DCIS. Both of these areas were biopsied on 03/04/2013, and both showed fibrocystic changes with no evidence of malignancy ([id number]).   (4) genetics testing is pending  (5)  Being treated in the neoadjuvant setting, the plan being to treat with 4 dose dense cycles of  doxorubicin/cyclophosphamide, with the first dose given on 03/15/2013. Patient receives Neulasta on day 2 of each cycle for granulocyte support. These 4 cycles will be  followed by 12 weekly doses of carboplatin/paclitaxel given in standard dose, with all neoadjuvant chemotherapy to be completed prior to definitive surgery.    PLAN: Rhian will proceed to treatment today as scheduled for day 1 cycle 2 of neoadjuvant docetaxel/cyclophosphamide. She will return tomorrow for Neulasta on day 2, and we'll see Norina Buzzard, NP, next week on October 1 for repeat labs and assessment of chemotoxicity. She does have some refills on her Cipro, and if she is again neutropenic, she will be taking Cipro prophylactically as with cycle one, specifically 500 mg by mouth twice a day for 7 days.  As noted previously, she will be aggressive for the next several days with regards to her constipation and will take stool softeners and/or MiraLAX as needed to maintain regular bowel movements. She has all of her antinausea medications on hand as well, and knows how to take all these appropriately.  [name] voices understanding and agreement with our plan, and knows to call with any changes or problems prior to her next scheduled appointment.   Royelle Hinchman, PA-C   03/29/2013 10:22 AM

## 2013-03-30 ENCOUNTER — Ambulatory Visit (HOSPITAL_BASED_OUTPATIENT_CLINIC_OR_DEPARTMENT_OTHER): Payer: BC Managed Care – PPO

## 2013-03-30 VITALS — BP 108/54 | HR 78 | Temp 97.7°F | Resp 20

## 2013-03-30 DIAGNOSIS — Z5189 Encounter for other specified aftercare: Secondary | ICD-10-CM

## 2013-03-30 DIAGNOSIS — C50419 Malignant neoplasm of upper-outer quadrant of unspecified female breast: Secondary | ICD-10-CM

## 2013-03-30 DIAGNOSIS — C50412 Malignant neoplasm of upper-outer quadrant of left female breast: Secondary | ICD-10-CM

## 2013-03-30 MED ORDER — PEGFILGRASTIM INJECTION 6 MG/0.6ML
6.0000 mg | Freq: Once | SUBCUTANEOUS | Status: AC
  Administered 2013-03-30: 6 mg via SUBCUTANEOUS
  Filled 2013-03-30: qty 0.6

## 2013-03-31 ENCOUNTER — Telehealth: Payer: Self-pay | Admitting: Family Medicine

## 2013-03-31 NOTE — Telephone Encounter (Signed)
Patient calling with concerns of low BP.  Had chemo on 9/23 & BP was 128/81.  Received Neulasta infusion yesterday and BP was 108/54 pre-infusion.  Today BP is 97/59.  Denies any dizziness & states she "feels fine, just tired."  Has adequate hydration.  Discussed with Dr. Mauricio Po (preceptor)--Will have patient hold propanolol for now and come in next week for BP followup appt.  Patient has appt with oncology on 04/05/13 and will call back with BP reading and will schedule an appt later.  Informed patient to call back if BP worsens or she develops any dizziness.  Patient verbalized understanding and agreeable to plan.  Gaylene Brooks, RN

## 2013-04-06 ENCOUNTER — Other Ambulatory Visit (HOSPITAL_BASED_OUTPATIENT_CLINIC_OR_DEPARTMENT_OTHER): Payer: BC Managed Care – PPO | Admitting: Lab

## 2013-04-06 ENCOUNTER — Encounter: Payer: Self-pay | Admitting: Family

## 2013-04-06 ENCOUNTER — Ambulatory Visit (HOSPITAL_BASED_OUTPATIENT_CLINIC_OR_DEPARTMENT_OTHER): Payer: BC Managed Care – PPO | Admitting: Family

## 2013-04-06 VITALS — BP 131/85 | HR 93 | Temp 98.3°F | Resp 20 | Ht 66.0 in | Wt 146.7 lb

## 2013-04-06 DIAGNOSIS — C50419 Malignant neoplasm of upper-outer quadrant of unspecified female breast: Secondary | ICD-10-CM

## 2013-04-06 DIAGNOSIS — D702 Other drug-induced agranulocytosis: Secondary | ICD-10-CM

## 2013-04-06 DIAGNOSIS — C50912 Malignant neoplasm of unspecified site of left female breast: Secondary | ICD-10-CM

## 2013-04-06 DIAGNOSIS — Z171 Estrogen receptor negative status [ER-]: Secondary | ICD-10-CM

## 2013-04-06 DIAGNOSIS — C50412 Malignant neoplasm of upper-outer quadrant of left female breast: Secondary | ICD-10-CM

## 2013-04-06 LAB — CBC WITH DIFFERENTIAL/PLATELET
Basophils Absolute: 0 10*3/uL (ref 0.0–0.1)
EOS%: 0 % (ref 0.0–7.0)
Eosinophils Absolute: 0 10*3/uL (ref 0.0–0.5)
HCT: 32.3 % — ABNORMAL LOW (ref 34.8–46.6)
HGB: 10.5 g/dL — ABNORMAL LOW (ref 11.6–15.9)
MONO#: 0.2 10*3/uL (ref 0.1–0.9)
NEUT#: 0.1 10*3/uL — CL (ref 1.5–6.5)
RDW: 13.3 % (ref 11.2–14.5)
WBC: 0.8 10*3/uL — CL (ref 3.9–10.3)
lymph#: 0.5 10*3/uL — ABNORMAL LOW (ref 0.9–3.3)

## 2013-04-06 LAB — COMPREHENSIVE METABOLIC PANEL (CC13)
Albumin: 3.2 g/dL — ABNORMAL LOW (ref 3.5–5.0)
Alkaline Phosphatase: 100 U/L (ref 40–150)
CO2: 27 mEq/L (ref 22–29)
Calcium: 9.2 mg/dL (ref 8.4–10.4)
Chloride: 106 mEq/L (ref 98–109)
Glucose: 72 mg/dl (ref 70–140)
Potassium: 3.7 mEq/L (ref 3.5–5.1)
Sodium: 140 mEq/L (ref 136–145)
Total Protein: 6.4 g/dL (ref 6.4–8.3)

## 2013-04-06 NOTE — Patient Instructions (Signed)
Please contact us at [phone] if you have any questions or concerns.  Please continue to do well and enjoy life!!!  Get plenty of rest, drink plenty of water, exercise daily (walking), eat a balanced diet (eat more protein).   [facility] Main: [phone] [place]   Results for orders placed in visit on 04/06/13 (from the past 24 hour(s))  CBC WITH DIFFERENTIAL     Status: Abnormal   Collection Time    04/06/13 10:12 AM      Result Value Range   WBC 0.8 (*) 3.9 - 10.3 10e3/uL   NEUT# 0.1 (*) 1.5 - 6.5 10e3/uL   HGB 10.5 (*) 11.6 - 15.9 g/dL   HCT 09.8 (*) 11.9 - 14.7 %   Platelets 239  145 - 400 10e3/uL   MCV 84.6  79.5 - 101.0 fL   MCH 27.5  25.1 - 34.0 pg   MCHC 32.5  31.5 - 36.0 g/dL   RBC 8.29  5.62 - 1.30 10e6/uL   RDW 13.3  11.2 - 14.5 %   lymph# 0.5 (*) 0.9 - 3.3 10e3/uL   MONO# 0.2  0.1 - 0.9 10e3/uL   Eosinophils Absolute 0.0  0.0 - 0.5 10e3/uL   Basophils Absolute 0.0  0.0 - 0.1 10e3/uL   NEUT% 10.7 (*) 38.4 - 76.8 %   LYMPH% 64.0 (*) 14.0 - 49.7 %   MONO% 20.0 (*) 0.0 - 14.0 %   EOS% 0.0  0.0 - 7.0 %   BASO% 5.3 (*) 0.0 - 2.0 %   nRBC 0  0 - 0 %   Narrative:    Performed At:  [facility]               [address]               [place], [place] [address]  COMPREHENSIVE METABOLIC PANEL (CC13)     Status: Abnormal   Collection Time    04/06/13 10:12 AM      Result Value Range   Sodium 140  136 - 145 mEq/L   Potassium 3.7  3.5 - 5.1 mEq/L   Chloride 106  98 - 109 mEq/L   CO2 27  22 - 29 mEq/L   Glucose 72  70 - 140 mg/dl   BUN 8.1  7.0 - 46.9 mg/dL   Creatinine 0.7  0.6 - 1.1 mg/dL   Total Bilirubin 6.29  0.20 - 1.20 mg/dL   Alkaline Phosphatase 100  40 - 150 U/L   AST 10  5 - 34 U/L   ALT 10  0 - 55 U/L   Total Protein 6.4  6.4 - 8.3 g/dL   Albumin 3.2 (*) 3.5 - 5.0 g/dL   Calcium 9.2  8.4 - 52.8 mg/dL   Narrative:    Performed At:  [facility]               [address]               [place],  [place] [address]

## 2013-04-06 NOTE — Progress Notes (Signed)
[facility]  [phone] [phone]  OFFICE PROGRESS NOTE   ID: [name] OB: 1960/12/25  MR#: [id number]  JYN#:[id number]  PCP: Maryjean Ka, MD GYN: Sherrill Raring, MD SU: Almond Lint, MD RAD ONC: Lurline Hare, MD [facility] FP: Paula Compton, MD    CHIEF COMPLAINT:  Left  Breast Cancer  HISTORY OF PRESENT ILLNESS: Ana Young herself palpated a mass in her left breast 01/15/2013 and brought it to the attention of Dr. Mauricio Po, who arranged for her to have bilateral digital mammography at the breast Center 01/27/2013. The patient does have heterogeneously dense breasts. In the outer upper left breast there was an oval mass by mammography measuring approximately 1.5 cm, which by palpation was firm and measured approximately 1 cm. Ultrasound showed this to be hypoechoic and to measure 1.4 cm.  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  The patient then underwent bilateral breast MRI 02/28/2013. In the left breast upper-outer quadrant the mass in question measured 2.9 cm. There was a second mass lateral to it measuring 1.0 cm. There were no additional abnormal appearing lymph nodes and no evidence of internal mammary adenopathy.  In the right breast there were 2 areas of irregular enhancement in the superior central third of the breast, both measuring 9 mm. These were felt to be worrisome for a ductal carcinoma in situ.  The patient's case was discussed at the multidisciplinary breast cancer conference 03/02/2013. Her subsequent history is as detailed below.   INTERVAL HISTORY: Ana Young returns today accompanied by her husband [name] for followup of her locally advanced left breast carcinoma.  Today is a nadir office visit for laboratory assessment and chemotherapy toxicity evaluation.  Since her last  office visit on 03/29/2013, Ana Young interval history is significant for her discontinuing her blood pressure medication as of 03/30/2013 secondary to hypotension.  She continues to have asymptomatic chemotherapy induced neutropenia and will be taking ciprofloxacin 500 mg by mouth twice a day x 7 days until her next chemotherapy cycle scheduled for 04/12/2013.  Her interval history is otherwise unremarkable.   REVIEW OF SYSTEMS: A 10 point review of systems was completed and is negative except for Ana Young experiencing occasional blurry vision when watching television.  This may be secondary to Dexamethasone.  Mrs. Ana Young denies any other symptomatology including fatigue, fever or chills, headache, swollen glands, cough or shortness of breath, chest pain or discomfort, nausea, vomiting, diarrhea, constipation, change in urinary or bowel habits, arthralgias/myalgias, unusual bleeding/bruising or any other symptomatology.  A detailed review of systems is otherwise stable and noncontributory.    PAST MEDICAL HISTORY: Past Medical History  Diagnosis Date  . Depression   . Vitamin D deficiency   . Anxiety   . Seasonal allergies   . HTN (hypertension)   . Insomnia   . History of migraines   . Cancer     Left breast cancer    PAST SURGICAL HISTORY: Past Surgical History  Procedure Laterality Date  . Bunionectomy Left 2009  . Portacath placement Left 03/09/2013    Procedure: INSERTION PORT-A-CATH;  Surgeon: Almond Lint, MD;  Location: [facility];  Service: General;  Laterality: Left;    FAMILY HISTORY Family History  Problem Relation Age of Onset  . Heart attack Brother   . Congestive Heart Failure Mother   . Stroke Father   . Diabetes  Father   . Breast cancer Maternal Grandmother 98  The patient's parents are in their 6s. The patient has one brother, no sisters. There is no history of ovarian cancer in the family.   GYNECOLOGIC HISTORY:  Menarche age 66,  first live birth age 30, the patient is GX P2. Her periods are irregular, but still every 2-3 months, and otherwise unremarkable.   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 Her husband [name] works as a Actuary. Son Ana Young lives in [place] and works in Engineering geologist. Daughter Ana Young is 80 year old and at home. The patient has no grandchildren. She attends the [employer] church.   ADVANCED DIRECTIVES: Not in place  HEALTH MAINTENANCE: History  Substance Use Topics  . Smoking status: Never Smoker   . Smokeless tobacco: Never Used  . Alcohol Use: No     Colonoscopy: Not on file  PAP: Feb 2012, Dr. Jeanice Lim  Bone density: Not on file  Lipid panel:  Dec 2012, Dr. Ashley Royalty  No Known Allergies  Current Outpatient Prescriptions  Medication Sig Dispense Refill  . buPROPion (WELLBUTRIN XL) 300 MG 24 hr tablet TAKE 1 TABLET (300 MG TOTAL) BY MOUTH EVERY MORNING.  30 tablet  3  . dexamethasone (DECADRON) 4 MG tablet 2 tabs by mouth with food, twice daily x 3 days after each chemo  30 tablet  1  . docusate sodium (COLACE) 100 MG capsule Take 1-2 capsules (100-200 mg total) by mouth every morning. As directed for constipation  30 capsule  1  . LORazepam (ATIVAN) 0.5 MG tablet Take 1 tablet (0.5 mg total) by mouth at bedtime as needed for anxiety (Nausea or vomiting).  30 tablet  0  . zolpidem (AMBIEN) 5 MG tablet TAKE 1 TABLET BY MOUTH AT BEDTIME AS NEEDED FOR SLEEP  30 tablet  0  . ALPRAZolam (XANAX) 1 MG tablet Take 1 mg by mouth 2 (two) times daily as needed for sleep (1/2 tablet bid as needed).      . ciprofloxacin (CIPRO) 500 MG tablet Take 1 tablet (500 mg total) by mouth 2 (two) times daily. X 7 days  14 tablet  3  . ibuprofen (ADVIL,MOTRIN) 800 MG tablet TAKE 1 TABLET (800 MG TOTAL) BY MOUTH EVERY 8 (EIGHT) HOURS AS NEEDED FOR PAIN.   30 tablet  2  . lidocaine-prilocaine (EMLA) cream Apply topically as needed. For chemotherapy  30 g  0  . oxyCODONE-acetaminophen (ROXICET) 5-325 MG per tablet Take 1-2 tablets by mouth every 4 (four) hours as needed for pain.  30 tablet  0  . polyethylene glycol powder (MIRALAX) powder Take 17 g by mouth daily as needed (constipation).  255 g  1  . prochlorperazine (COMPAZINE) 10 MG tablet 1 tab by mouth with meals and bedtime x 3 days after chemo, then 1 tab by mouth every 6 hrs PRN nausea  30 tablet  2  . propranolol ER (INDERAL LA) 120 MG 24 hr capsule TAKE 1 CAPSULE (120 MG TOTAL) BY MOUTH DAILY.  93 capsule  1   No current facility-administered medications for this visit.    OBJECTIVE: Middle-aged [place] woman who appears stated age and is in no acute distress: Filed Vitals:   04/06/13 1039  BP: 131/85  Pulse: 93  Temp: 98.3 F (36.8 C)  Resp: 20     Body  mass index is 23.69 kg/(m^2).   ECOG FS: 1 - Symptomatic but completely ambulatory  Filed Weights   04/06/13 1039  Weight: 146 lb 11.2 oz (66.543 kg)    PHYSICAL EXAM: General appearance: Alert, cooperative, well nourished, no apparent distress Head: Normocephalic, without obvious abnormality, atraumatic, the patient is wearing a hat Eyes: Conjunctivae/corneas clear, PERRLA, EOMI Nose: Nares, septum and mucosa are normal, no drainage or sinus tenderness Neck: No adenopathy, supple, symmetrical, trachea midline, no tenderness Resp: Clear to auscultation bilaterally, no wheezes/rales/rhonchi Cardio: Regular rate and rhythm, S1, S2 normal, no murmur, click, rub or gallop, no edema, left chest Port-A-Cath without signs of infection Breasts:  Deferred GI: Soft, not distended, non-tender, normoactive bowel sounds, no organomegaly Skin: No rashes/lesions, skin warm and dry, no erythematous areas, no cyanosis  M/S:  Atraumatic, normal strength in all extremities, normal range of motion, no clubbing  Lymph nodes:  Cervical, supraclavicular, and axillary nodes normal Neurologic: Grossly normal, cranial nerves II through XII intact, alert and oriented x 3 Psych: Appropriate affect    LAB RESULTS: Lab Results  Component Value Date   WBC 0.8* 04/06/2013   NEUTROABS 0.1* 04/06/2013   HGB 10.5* 04/06/2013   HCT 32.3* 04/06/2013   MCV 84.6 04/06/2013   PLT 239 04/06/2013      Chemistry      Component Value Date/Time   NA 140 04/06/2013 1012   NA 138 06/16/2011 0938   K 3.7 04/06/2013 1012   K 4.1 06/16/2011 0938   CL 110 06/16/2011 0938   CO2 27 04/06/2013 1012   CO2 26 06/16/2011 0938   BUN 8.1 04/06/2013 1012   BUN 10 06/16/2011 0938   CREATININE 0.7 04/06/2013 1012   CREATININE 0.81 06/16/2011 0938   CREATININE 0.74 07/26/2010 2332      Component Value Date/Time   CALCIUM 9.2 04/06/2013 1012   CALCIUM 9.5 06/16/2011 0938   ALKPHOS 100 04/06/2013 1012   ALKPHOS 78 06/16/2011 0938   AST 10 04/06/2013 1012   AST 13 06/16/2011 0938   ALT 10 04/06/2013 1012   ALT 10 06/16/2011 0938   BILITOT 0.31 04/06/2013 1012   BILITOT 0.5 06/16/2011 0938      STUDIES: 1.  Echocardiogram on 03/08/2013 showed an ejection fraction of 55-60%.    ASSESSMENT: 52 y.o. [place], [place] woman:   (1)  Status post left breast upper outer quadrant biopsy 02/22/2013 for a clinical T2 N0, stage IIA invasive ductal carcinoma, grade 3, triple negative, with an MIB-1 of 100%.    (2) MRI on 03/02/2013 showed an additional nodule at the 2 o'clock position, 5 mm lateral to the primary in the left breast, measuring 10 x 6 x 9 mm. This was biopsied on 03/11/2013 and was consistent with high-grade carcinoma [phone]).  Estrogen receptor negative, progesterone receptor negative, Ki-67 55%, insufficient invasive tumor present for HER-2 analysis.  (3) MRI on 03/02/2013 also showed 2 areas of irregularity in the right breast which were worrisome for possible DCIS. Both of these areas were biopsied on 03/04/2013, and  both showed fibrocystic changes with no evidence of malignancy ([id number]).   (4) Genetics testing is pending.  (5)  Being treated in the neoadjuvant setting, the plan being to treat with 4 dose dense cycles of doxorubicin/cyclophosphamide, with the first dose given on 03/15/2013. Patient receives Neulasta on day 2 of each cycle for granulocyte support. These 4 cycles will be followed by 12 weekly doses of carboplatin/paclitaxel given in standard dose, with all  neoadjuvant chemotherapy to be completed prior to definitive surgery.  (6) Chemotherapy-induced neutropenia.   PLAN: Mrs. Ana Young will take Ciprofloxacin 500 mg by mouth twice a day x 7 days for chemotherapy-induced neutropenia until her next neoadjuvant chemotherapy cycle scheduled for 04/12/2013.  She is scheduled to proceed with neoadjuvant chemotherapy cycle #3 consisting of dose dense doxorubicin/cyclophosphamide on 04/12/2013 with Neulasta injection for granulocyte support scheduled on 04/13/2013.  All questions answered.  Mr. and Mrs. Ana Young were encouraged to contact us in the interim with any questions, concerns, or problems.  Larina Bras, NP-C 04/06/2013 5:24 PM

## 2013-04-07 ENCOUNTER — Encounter: Payer: Self-pay | Admitting: *Deleted

## 2013-04-07 NOTE — Progress Notes (Signed)
[facility] Clinical Social Work  Clinical Social Work was referred by patient  for assessment of psychosocial needs due to financial concerns.  Clinical Social Worker met with patient at [facility] and assisted Pt with her social security disability application. Pt had several questions about the process. CSW assisted Pt in obtaining her medical records to assist with the application process. CSW explained how the disability process works and that she may not qualify currently.  Pt stated understanding and will call CSW after her appt with social security.   Doreen Salvage, LCSW Clinical Social Worker [facility] Wednesday, Thursday and Friday Phone: [phone] Fax: [phone]

## 2013-04-12 ENCOUNTER — Ambulatory Visit (HOSPITAL_BASED_OUTPATIENT_CLINIC_OR_DEPARTMENT_OTHER): Payer: BC Managed Care – PPO

## 2013-04-12 ENCOUNTER — Encounter: Payer: Self-pay | Admitting: Family

## 2013-04-12 ENCOUNTER — Ambulatory Visit (HOSPITAL_BASED_OUTPATIENT_CLINIC_OR_DEPARTMENT_OTHER): Payer: BC Managed Care – PPO | Admitting: Family

## 2013-04-12 ENCOUNTER — Other Ambulatory Visit (HOSPITAL_BASED_OUTPATIENT_CLINIC_OR_DEPARTMENT_OTHER): Payer: BC Managed Care – PPO | Admitting: Lab

## 2013-04-12 VITALS — BP 137/84 | HR 94 | Temp 98.1°F | Resp 18 | Ht 66.0 in | Wt 147.6 lb

## 2013-04-12 DIAGNOSIS — Z171 Estrogen receptor negative status [ER-]: Secondary | ICD-10-CM

## 2013-04-12 DIAGNOSIS — Z5111 Encounter for antineoplastic chemotherapy: Secondary | ICD-10-CM

## 2013-04-12 DIAGNOSIS — C50412 Malignant neoplasm of upper-outer quadrant of left female breast: Secondary | ICD-10-CM

## 2013-04-12 DIAGNOSIS — C50419 Malignant neoplasm of upper-outer quadrant of unspecified female breast: Secondary | ICD-10-CM

## 2013-04-12 DIAGNOSIS — C50912 Malignant neoplasm of unspecified site of left female breast: Secondary | ICD-10-CM

## 2013-04-12 LAB — CBC WITH DIFFERENTIAL/PLATELET
BASO%: 1.8 % (ref 0.0–2.0)
EOS%: 0 % (ref 0.0–7.0)
HGB: 11.4 g/dL — ABNORMAL LOW (ref 11.6–15.9)
LYMPH%: 15.6 % (ref 14.0–49.7)
MCH: 27.9 pg (ref 25.1–34.0)
MCHC: 32.8 g/dL (ref 31.5–36.0)
MCV: 85.3 fL (ref 79.5–101.0)
MONO%: 11.6 % (ref 0.0–14.0)
NEUT#: 7.6 10*3/uL — ABNORMAL HIGH (ref 1.5–6.5)
RBC: 4.08 10*6/uL (ref 3.70–5.45)
RDW: 13.5 % (ref 11.2–14.5)
lymph#: 1.7 10*3/uL (ref 0.9–3.3)
nRBC: 1 % — ABNORMAL HIGH (ref 0–0)

## 2013-04-12 MED ORDER — SODIUM CHLORIDE 0.9 % IV SOLN
600.0000 mg/m2 | Freq: Once | INTRAVENOUS | Status: AC
  Administered 2013-04-12: 1060 mg via INTRAVENOUS
  Filled 2013-04-12: qty 53

## 2013-04-12 MED ORDER — DOXORUBICIN HCL CHEMO IV INJECTION 2 MG/ML
60.0000 mg/m2 | Freq: Once | INTRAVENOUS | Status: AC
  Administered 2013-04-12: 106 mg via INTRAVENOUS
  Filled 2013-04-12: qty 53

## 2013-04-12 MED ORDER — DEXAMETHASONE SODIUM PHOSPHATE 20 MG/5ML IJ SOLN
12.0000 mg | Freq: Once | INTRAMUSCULAR | Status: AC
  Administered 2013-04-12: 12 mg via INTRAVENOUS

## 2013-04-12 MED ORDER — PALONOSETRON HCL INJECTION 0.25 MG/5ML
INTRAVENOUS | Status: AC
  Filled 2013-04-12: qty 5

## 2013-04-12 MED ORDER — DEXAMETHASONE SODIUM PHOSPHATE 20 MG/5ML IJ SOLN
INTRAMUSCULAR | Status: AC
  Filled 2013-04-12: qty 5

## 2013-04-12 MED ORDER — PALONOSETRON HCL INJECTION 0.25 MG/5ML
0.2500 mg | Freq: Once | INTRAVENOUS | Status: AC
  Administered 2013-04-12: 0.25 mg via INTRAVENOUS

## 2013-04-12 MED ORDER — SODIUM CHLORIDE 0.9 % IV SOLN
Freq: Once | INTRAVENOUS | Status: AC
  Administered 2013-04-12: 11:00:00 via INTRAVENOUS

## 2013-04-12 MED ORDER — SODIUM CHLORIDE 0.9 % IV SOLN
150.0000 mg | Freq: Once | INTRAVENOUS | Status: AC
  Administered 2013-04-12: 150 mg via INTRAVENOUS
  Filled 2013-04-12: qty 5

## 2013-04-12 MED ORDER — HEPARIN SOD (PORK) LOCK FLUSH 100 UNIT/ML IV SOLN
500.0000 [IU] | Freq: Once | INTRAVENOUS | Status: AC | PRN
  Administered 2013-04-12: 500 [IU]
  Filled 2013-04-12: qty 5

## 2013-04-12 MED ORDER — SODIUM CHLORIDE 0.9 % IJ SOLN
10.0000 mL | INTRAMUSCULAR | Status: DC | PRN
  Administered 2013-04-12: 10 mL
  Filled 2013-04-12: qty 10

## 2013-04-12 NOTE — Patient Instructions (Addendum)
Results for orders placed in visit on 04/12/13 (from the past 24 hour(s))  CBC WITH DIFFERENTIAL     Status: Abnormal   Collection Time    04/12/13  9:13 AM      Result Value Range   WBC 10.7 (*) 3.9 - 10.3 10e3/uL   NEUT# 7.6 (*) 1.5 - 6.5 10e3/uL   HGB 11.4 (*) 11.6 - 15.9 g/dL   HCT 16.1  09.6 - 04.5 %   Platelets 275  145 - 400 10e3/uL   MCV 85.3  79.5 - 101.0 fL   MCH 27.9  25.1 - 34.0 pg   MCHC 32.8  31.5 - 36.0 g/dL   RBC 4.09  8.11 - 9.14 10e6/uL   RDW 13.5  11.2 - 14.5 %   lymph# 1.7  0.9 - 3.3 10e3/uL   MONO# 1.2 (*) 0.1 - 0.9 10e3/uL   Eosinophils Absolute 0.0  0.0 - 0.5 10e3/uL   Basophils Absolute 0.2 (*) 0.0 - 0.1 10e3/uL   NEUT% 71.0  38.4 - 76.8 %   LYMPH% 15.6  14.0 - 49.7 %   MONO% 11.6  0.0 - 14.0 %   EOS% 0.0  0.0 - 7.0 %   BASO% 1.8  0.0 - 2.0 %   nRBC 1 (*) 0 - 0 %   Narrative:    Beginning 9/16/2014Performed At:  [facility]               [address]               [place], [place] [address]

## 2013-04-12 NOTE — Progress Notes (Signed)
[facility]  [phone] [phone]) [phone]  OFFICE PROGRESS NOTE    ID: [name] OB: May 16, 1961  MR#: [id number]  JYN#:[id number]  PCP: Maryjean Ka, MD GYN:   SU: Almond Lint, MD RAD ONC: Lurline Hare, MD [facility] FAM PRAC:  Paula Compton, MD    CHIEF COMPLAINT:  Left  Breast Cancer   HISTORY OF PRESENT ILLNESS: Ana Young herself palpated a mass in her left breast 01/15/2013 and brought it to the attention of Dr. Mauricio Po, who arranged for her to have bilateral digital mammography at the breast Center 01/27/2013. The patient does have heterogeneously dense breasts. In the outer upper left breast there was an oval mass by mammography measuring approximately 1.5 cm, which by palpation was firm and measured approximately 1 cm. Ultrasound showed this to be hypoechoic and to measure 1.4 cm.  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  The patient then underwent bilateral breast MRI 02/28/2013. In the left breast upper-outer quadrant the mass in question measured 2.9 cm. There was a second mass lateral to it measuring 1.0 cm. There were no additional abnormal appearing lymph nodes and no evidence of internal mammary adenopathy.  In the right breast there were 2 areas of irregular enhancement in the superior central third of the breast, both measuring 9 mm. These were felt to be worrisome for a ductal carcinoma in situ.  The patient's case was discussed at the multidisciplinary breast cancer conference 03/02/2013. Her subsequent history is as detailed below.   INTERVAL HISTORY: Ernestene returns today accompanied  her husband [name] for followup of her locally advanced left breast carcinoma. She is due for day 1 cycle 3 of 4 planned dose dense cycles of doxorubicin/cyclophosphamide being given in the neoadjuvant  setting. She receives Neulasta on day 2 for granulocyte support.  Taran is feeling well today and does not have any complaints.  Her energy level is good. She completed her recent course of Cipro for chemotherapy-induced neutropenia, and had no fevers or chills. She's had no problems at all with nausea or emesis.    REVIEW OF SYSTEMS: A 10 point review of systems was completed and is negative. [name] denies any symptomatology including fatigue, fever or chills, headache, vision changes, swollen glands, cough or shortness of breath, chest pain or discomfort, nausea, vomiting, diarrhea, constipation, change in urinary or bowel habits, arthralgias/myalgias, unusual bleeding/bruising or any other symptomatology.  A detailed review of systems is otherwise stable and noncontributory.    PAST MEDICAL HISTORY: Past Medical History  Diagnosis Date  . Depression   . Vitamin D deficiency   . Anxiety   . Seasonal allergies   . HTN (hypertension)   . Insomnia   . History of migraines   . Cancer     Left breast cancer    PAST SURGICAL HISTORY: Past Surgical History  Procedure Laterality Date  . Bunionectomy Left 2009  . Portacath placement Left 03/09/2013    Procedure: INSERTION PORT-A-CATH;  Surgeon: Almond Lint, MD;  Location: [facility];  Service: General;  Laterality: Left;     FAMILY HISTORY Family History  Problem Relation Age of Onset  . Heart attack Brother   . Congestive Heart Failure Mother   . Stroke Father   . Diabetes Father   . Breast cancer Maternal Grandmother 54  The patient's parents are in their 20s. The patient  has one brother, no sisters. The patient's maternal grandmother was diagnosed with breast cancer the age of 55. There is no other history of breast cancer in the family to the patient's knowledge, and no history of ovarian cancer   GYNECOLOGIC HISTORY:  Menarche age 23, first live birth age 79, the patient is G3 P2, one abortion. Her  periods are not irregular, but still every 2-3 months, and otherwise unremarkable.   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 Her husband [name] works as a Actuary. Son [name] lives in [place] and works in Engineering geologist. Daughter Raena Pau is 65 year old and at home. The patient has no grandchildren. She attends the [employer] church   ADVANCED DIRECTIVES: Not in place  HEALTH MAINTENANCE: History  Substance Use Topics  . Smoking status: Never Smoker   . Smokeless tobacco: Never Used  . Alcohol Use: No     Colonoscopy: -Never  PAP: Feb 2012, Dr. Jeanice Lim  Bone density: -Never  Lipid panel:  Dec 2012, Dr. Ashley Royalty  No Known Allergies  Current Outpatient Prescriptions  Medication Sig Dispense Refill  . ALPRAZolam (XANAX) 1 MG tablet Take 1 mg by mouth 2 (two) times daily as needed for sleep (1/2 tablet bid as needed).      Marland Kitchen buPROPion (WELLBUTRIN XL) 300 MG 24 hr tablet TAKE 1 TABLET (300 MG TOTAL) BY MOUTH EVERY MORNING.  30 tablet  3  . dexamethasone (DECADRON) 4 MG tablet 2 tabs by mouth with food, twice daily x 3 days after each chemo  30 tablet  1  . lidocaine-prilocaine (EMLA) cream Apply topically as needed. For chemotherapy  30 g  0  . zolpidem (AMBIEN) 5 MG tablet TAKE 1 TABLET BY MOUTH AT BEDTIME AS NEEDED FOR SLEEP  30 tablet  0  . ciprofloxacin (CIPRO) 500 MG tablet Take 1 tablet (500 mg total) by mouth 2 (two) times daily. X 7 days  14 tablet  3  . docusate sodium (COLACE) 100 MG capsule Take 1-2 capsules (100-200 mg total) by mouth every morning. As directed for constipation  30 capsule  1  . ibuprofen (ADVIL,MOTRIN) 800 MG tablet TAKE 1 TABLET (800 MG TOTAL) BY MOUTH EVERY 8 (EIGHT) HOURS AS NEEDED FOR PAIN.  30 tablet  2  . LORazepam (ATIVAN) 0.5 MG tablet Take 1 tablet (0.5 mg total) by mouth at bedtime as needed for  anxiety (Nausea or vomiting).  30 tablet  0  . oxyCODONE-acetaminophen (ROXICET) 5-325 MG per tablet Take 1-2 tablets by mouth every 4 (four) hours as needed for pain.  30 tablet  0  . polyethylene glycol powder (MIRALAX) powder Take 17 g by mouth daily as needed (constipation).  255 g  1  . prochlorperazine (COMPAZINE) 10 MG tablet 1 tab by mouth with meals and bedtime x 3 days after chemo, then 1 tab by mouth every 6 hrs PRN nausea  30 tablet  2  . propranolol ER (INDERAL LA) 120 MG 24 hr capsule TAKE 1 CAPSULE (120 MG TOTAL) BY MOUTH DAILY.  93 capsule  1   No current facility-administered medications for this visit.    OBJECTIVE: Middle-aged [place] American woman who appears stated age and is in no acute distress Filed Vitals:   04/12/13 0923  BP: 137/84  Pulse: 94  Temp: 98.1 F (36.7 C)  Resp: 18  Body mass index is 23.83 kg/(m^2).    ECOG FS: 0 - Asymptomatic  Filed Weights   04/12/13 0923  Weight: 147 lb 9.6 oz (66.951 kg)     PHYSICAL EXAM: General appearance: Alert, cooperative, well nourished, no apparent distress  Head: Normocephalic, without obvious abnormality, atraumatic, the patient is wearing a hat  Eyes: Conjunctivae/corneas clear, PERRLA, EOMI  Nose: Nares, septum and mucosa are normal, no drainage or sinus tenderness  Neck: No adenopathy, supple, symmetrical, trachea midline, no tenderness  Resp: Clear to auscultation bilaterally, no wheezes/rales/rhonchi  Cardio: Regular rate and rhythm, S1, S2 normal, no murmur, click, rub or gallop, no edema, left chest Port-A-Cath covered and EMLA cream with an occlusive dressing  Breasts: Deferred  GI: Soft, not distended, non-tender, normoactive bowel sounds, no organomegaly  Skin: No rashes/lesions, skin warm and dry, no erythematous areas, no cyanosis  M/S: Atraumatic, normal strength in all extremities, normal range of motion, no clubbing  Lymph nodes: Cervical, supraclavicular, and axillary nodes normal    Neurologic: Grossly normal, cranial nerves II through XII intact, alert and oriented x 3  Psych: Appropriate affect   LAB RESULTS: Lab Results  Component Value Date   WBC 10.7* 04/12/2013   NEUTROABS 7.6* 04/12/2013   HGB 11.4* 04/12/2013   HCT 34.8 04/12/2013   MCV 85.3 04/12/2013   PLT 275 04/12/2013      Chemistry      Component Value Date/Time   NA 140 04/06/2013 1012   NA 138 06/16/2011 0938   K 3.7 04/06/2013 1012   K 4.1 06/16/2011 0938   CL 110 06/16/2011 0938   CO2 27 04/06/2013 1012   CO2 26 06/16/2011 0938   BUN 8.1 04/06/2013 1012   BUN 10 06/16/2011 0938   CREATININE 0.7 04/06/2013 1012   CREATININE 0.81 06/16/2011 0938   CREATININE 0.74 07/26/2010 2332      Component Value Date/Time   CALCIUM 9.2 04/06/2013 1012   CALCIUM 9.5 06/16/2011 0938   ALKPHOS 100 04/06/2013 1012   ALKPHOS 78 06/16/2011 0938   AST 10 04/06/2013 1012   AST 13 06/16/2011 0938   ALT 10 04/06/2013 1012   ALT 10 06/16/2011 0938   BILITOT 0.31 04/06/2013 1012   BILITOT 0.5 06/16/2011 0938      STUDIES: Echocardiogram on 03/08/2013 showed an ejection fraction of 55-60%.     ASSESSMENT: 52 y.o. [place], [place] woman:  (1)  Status post left breast upper outer quadrant biopsy 02/22/2013 for a clinical T2 N0, stage IIA invasive ductal carcinoma, grade 3, triple negative, with an MIB-1 of 100%.    (2)  MRI on 03/02/2013 showed an additional nodule at the 2:00 position, 5 mm lateral to the primary in the left breast, measuring 10 x 6 x 9 mm. This was biopsied on 03/11/2013 and was consistent with high-grade carcinoma [phone] Prognostic panel is pending.   (3)  MRI on 03/02/2013 also showed 2 areas of irregularity in the right breast which were worrisome for possible DCIS. Both of these areas were biopsied on 03/04/2013, and both showed fibrocystic changes with no evidence of malignancy ([id number]).   (4)  Genetics testing is pending.  (5)  Being treated in the neoadjuvant  setting, the plan being to treat with 4 dose dense cycles of doxorubicin/cyclophosphamide, with the first dose given on 03/15/2013. Patient receives Neulasta on day 2 of each cycle for granulocyte support. These 4 cycles will be followed by 12 weekly doses of carboplatin/paclitaxel given in standard dose,  with all neoadjuvant chemotherapy to be completed prior to definitive surgery.    PLAN: Jovie will proceed to treatment today as scheduled for day 1 cycle 3 of 4 of neoadjuvant docetaxel/cyclophosphamide. She will return tomorrow for Neulasta on day 2.  We plan to see her again on 04/19/2013 for an office visit with Zollie Scale, PA-C.  Her 4th and final cycle of neoadjuvant docetaxel/cyclophosphamide is scheduled for 04/26/2013 with a Neulasta injection scheduled for a 04/27/2013.  The plan is to follow current neoadjuvant chemotherapy regimen with 12 weekly cycles of neoadjuvant chemotherapy consisting of carboplatin/paclitaxel prior to definitive surgery.  All questions answered.  Mr. and [name] encouraged to contact us in the interim with any questions, concerns, or problems.  Larina Bras, NP-C 04/12/2013  9:56 AM

## 2013-04-12 NOTE — Progress Notes (Signed)
Normal saline free dripping, positive blood return noted before, every 5 mL during, and after Adriamycin administration.

## 2013-04-12 NOTE — Patient Instructions (Addendum)
[facility] Discharge Instructions for Patients Receiving Chemotherapy  Today you received the following chemotherapy agents Adriamycin/Cytoxan To help prevent nausea and vomiting after your treatment, we encourage you to take your nausea medication as prescribed. If you develop nausea and vomiting that is not controlled by your nausea medication, call the clinic.   BELOW ARE SYMPTOMS THAT SHOULD BE REPORTED IMMEDIATELY:  *FEVER GREATER THAN 100.5 F  *CHILLS WITH OR WITHOUT FEVER  NAUSEA AND VOMITING THAT IS NOT CONTROLLED WITH YOUR NAUSEA MEDICATION  *UNUSUAL SHORTNESS OF BREATH  *UNUSUAL BRUISING OR BLEEDING  TENDERNESS IN MOUTH AND THROAT WITH OR WITHOUT PRESENCE OF ULCERS  *URINARY PROBLEMS  *BOWEL PROBLEMS  UNUSUAL RASH Items with * indicate a potential emergency and should be followed up as soon as possible.  Feel free to call the clinic you have any questions or concerns. The clinic phone number is [phone].    

## 2013-04-13 ENCOUNTER — Ambulatory Visit (HOSPITAL_BASED_OUTPATIENT_CLINIC_OR_DEPARTMENT_OTHER): Payer: BC Managed Care – PPO

## 2013-04-13 ENCOUNTER — Other Ambulatory Visit: Payer: Self-pay | Admitting: *Deleted

## 2013-04-13 VITALS — BP 119/50 | HR 81 | Temp 98.4°F

## 2013-04-13 DIAGNOSIS — C50412 Malignant neoplasm of upper-outer quadrant of left female breast: Secondary | ICD-10-CM

## 2013-04-13 DIAGNOSIS — Z5189 Encounter for other specified aftercare: Secondary | ICD-10-CM

## 2013-04-13 DIAGNOSIS — C50419 Malignant neoplasm of upper-outer quadrant of unspecified female breast: Secondary | ICD-10-CM

## 2013-04-13 MED ORDER — PEGFILGRASTIM INJECTION 6 MG/0.6ML
6.0000 mg | Freq: Once | SUBCUTANEOUS | Status: AC
  Administered 2013-04-13: 6 mg via SUBCUTANEOUS
  Filled 2013-04-13: qty 0.6

## 2013-04-13 MED ORDER — LORAZEPAM 0.5 MG PO TABS: 0.5000 mg | ORAL_TABLET | Freq: Every evening | ORAL | Status: DC | PRN

## 2013-04-19 ENCOUNTER — Encounter (INDEPENDENT_AMBULATORY_CARE_PROVIDER_SITE_OTHER): Payer: Self-pay

## 2013-04-19 ENCOUNTER — Ambulatory Visit (HOSPITAL_BASED_OUTPATIENT_CLINIC_OR_DEPARTMENT_OTHER): Payer: BC Managed Care – PPO | Admitting: Physician Assistant

## 2013-04-19 ENCOUNTER — Telehealth: Payer: Self-pay | Admitting: *Deleted

## 2013-04-19 ENCOUNTER — Encounter: Payer: Self-pay | Admitting: Physician Assistant

## 2013-04-19 ENCOUNTER — Other Ambulatory Visit (HOSPITAL_BASED_OUTPATIENT_CLINIC_OR_DEPARTMENT_OTHER): Payer: BC Managed Care – PPO | Admitting: Lab

## 2013-04-19 VITALS — BP 103/68 | HR 84 | Temp 98.7°F | Resp 18 | Ht 66.0 in | Wt 143.1 lb

## 2013-04-19 DIAGNOSIS — Z171 Estrogen receptor negative status [ER-]: Secondary | ICD-10-CM

## 2013-04-19 DIAGNOSIS — D702 Other drug-induced agranulocytosis: Secondary | ICD-10-CM

## 2013-04-19 DIAGNOSIS — C50412 Malignant neoplasm of upper-outer quadrant of left female breast: Secondary | ICD-10-CM

## 2013-04-19 DIAGNOSIS — C50419 Malignant neoplasm of upper-outer quadrant of unspecified female breast: Secondary | ICD-10-CM

## 2013-04-19 LAB — COMPREHENSIVE METABOLIC PANEL (CC13)
ALT: 13 U/L (ref 0–55)
Anion Gap: 6 mEq/L (ref 3–11)
BUN: 8.8 mg/dL (ref 7.0–26.0)
CO2: 28 mEq/L (ref 22–29)
Calcium: 9.1 mg/dL (ref 8.4–10.4)
Chloride: 105 mEq/L (ref 98–109)
Creatinine: 0.7 mg/dL (ref 0.6–1.1)
Glucose: 91 mg/dl (ref 70–140)
Total Bilirubin: 0.51 mg/dL (ref 0.20–1.20)

## 2013-04-19 LAB — CBC WITH DIFFERENTIAL/PLATELET
Basophils Absolute: 0 10*3/uL (ref 0.0–0.1)
HCT: 31.5 % — ABNORMAL LOW (ref 34.8–46.6)
HGB: 10.4 g/dL — ABNORMAL LOW (ref 11.6–15.9)
LYMPH%: 46.6 % (ref 14.0–49.7)
MCHC: 33 g/dL (ref 31.5–36.0)
MONO#: 0.1 10*3/uL (ref 0.1–0.9)
NEUT%: 41.5 % (ref 38.4–76.8)
Platelets: 185 10*3/uL (ref 145–400)
WBC: 0.9 10*3/uL — CL (ref 3.9–10.3)
lymph#: 0.4 10*3/uL — ABNORMAL LOW (ref 0.9–3.3)

## 2013-04-19 NOTE — Telephone Encounter (Signed)
appts made and printed. Pt is aware that tx will be added. i emailed MW to add the tx...td 

## 2013-04-19 NOTE — Progress Notes (Signed)
ID: [name] OB: [birth year]  MR#: [id number]  CSN#:[id number]  PCP: Ana Ka, MD GYN:   SU: Ana Young OTHER MD: Ana Young, Ana Young  CHIEF COMPLAINT:  Left  Breast Cancer  HISTORY OF PRESENT ILLNESS: Ana Young herself palpated a mass in her left breast 01/15/2013 and brought it to the attention of Dr. Mauricio Young, who arranged for her to have bilateral digital mammography at the breast Center 01/27/2013. The patient does have heterogeneously dense breasts. In the outer upper left breast there was an oval mass by mammography measuring approximately 1.5 cm, which by palpation was firm and measured approximately 1 cm. Ultrasound showed this to be hypoechoic and to measure 1.4 cm.  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  The patient then underwent bilateral breast MRI 02/28/2013. In the left breast upper-outer quadrant the mass in question measured 2.9 cm. There was a second mass lateral to it measuring 1.0 cm. There were no additional abnormal appearing lymph nodes and no evidence of internal mammary adenopathy.  In the right breast there were 2 areas of irregular enhancement in the superior central third of the breast, both measuring 9 mm. These were felt to be worrisome for a ductal carcinoma in situ.  The patient's case was discussed at the multidisciplinary breast cancer conference 03/02/2013. Her subsequent history is as detailed below.  INTERVAL HISTORY: Ana Young returns today accompanied by her husband Ana Young for followup of her locally advanced left breast carcinoma. She is currently day 8 cycle 3 of 4 planned dose dense cycles of doxorubicin/cyclophosphamide being given in the neoadjuvant setting. She receives Neulasta on day 2 for granulocyte support.  Ana Young is feeling well today, and has few new complaints. She has had  some increased sensitivity in the soles of her feet bilaterally, with some mild redness and dry skin, but no actual cracking or peeling at this time. She's had no peripheral neuropathy. The skin on her hands is darkened, but is not sensitive. Her nail beds are also mildly hyperpigmented, but are not sensitive to touch.  As with past cycles, she again has afebrile chemotherapy-induced neutropenia. Fortunately, she's had no fevers, chills, or night sweats.    REVIEW OF SYSTEMS: Ana Young denies any abnormal bruising or bleeding. Her energy level is good. Her appetite is good, and she denies any nausea, emesis, or change in bowel or bladder habits. She does have some slight external irritation around the urethra for several days following chemotherapy, but this resolves. She's had no vaginal discharge, and no evidence of vaginal bleeding. She denies any cough, increased shortness of breath, orthopnea, palpitations or chest pain. She's had no abnormal headaches, change in vision, or dizziness, and also denies any current myalgias, arthralgias, or bony pain. She's had no peripheral swelling.  A detailed review of systems is otherwise stable and noncontributory.    PAST MEDICAL HISTORY: Past Medical History  Diagnosis Date  . Depression   . Vitamin D deficiency   . Anxiety   . Seasonal allergies   . HTN (hypertension)   . Insomnia   . History of migraines   . Cancer     Left breast cancer    PAST SURGICAL HISTORY: Past Surgical History  Procedure Laterality Date  . Bunionectomy Left 2009  . Portacath placement Left 03/09/2013    Procedure: INSERTION PORT-A-CATH;  Surgeon: Ana Lint, MD;  Location: [facility];  Service: General;  Laterality: Left;    FAMILY HISTORY Family History  Problem Relation Age of Onset  . Heart attack Brother   . Congestive Heart Failure Mother   . Stroke Father   . Diabetes Father   . Breast cancer Maternal Grandmother 4   the patient's  parents are in their 57s. The patient has one brother, no sisters. The patient's maternal grandmother was diagnosed with breast cancer the age of 45. There is no other history of breast cancer in the family to the patient's knowledge, and no history of ovarian cancer  GYNECOLOGIC HISTORY:  Menarche age 39, first live birth age 60, the patient is GX P2. Her periods are not irregular, but still every 2-3 months, and otherwise unremarkable.  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 Her husband Ana Young works as a Actuary. Son Ana Young lives in [place] and works in Engineering geologist. Daughter Ana Young is 22 year old and at home. The patient has no grandchildren. She attends the [employer] church    ADVANCED DIRECTIVES: Not in place  HEALTH MAINTENANCE: History  Substance Use Topics  . Smoking status: Never Smoker   . Smokeless tobacco: Never Used  . Alcohol Use: No     Colonoscopy: - Never  PAP: Feb 2012, Dr. Jeanice Young  Bone density: - Never  Lipid panel:  Dec 2012, Dr. Ashley Young   No Known Allergies  Current Outpatient Prescriptions  Medication Sig Dispense Refill  . ALPRAZolam (XANAX) 1 MG tablet Take 1 mg by mouth 2 (two) times daily as needed for sleep (1/2 tablet bid as needed).      Marland Kitchen buPROPion (WELLBUTRIN XL) 300 MG 24 hr tablet TAKE 1 TABLET (300 MG TOTAL) BY MOUTH EVERY MORNING.  30 tablet  3  . ciprofloxacin (CIPRO) 500 MG tablet Take 1 tablet (500 mg total) by mouth 2 (two) times daily. X 7 days  14 tablet  3  . dexamethasone (DECADRON) 4 MG tablet 2 tabs by mouth with food, twice daily x 3 days after each chemo  30 tablet  1  . lidocaine-prilocaine (EMLA) cream Apply topically as needed. For chemotherapy  30 g  0  . prochlorperazine (COMPAZINE) 10 MG tablet 1 tab by mouth with meals and bedtime x 3 days after chemo, then 1 tab by  mouth every 6 hrs PRN nausea  30 tablet  2  . zolpidem (AMBIEN) 5 MG tablet TAKE 1 TABLET BY MOUTH AT BEDTIME AS NEEDED FOR SLEEP  30 tablet  0  . docusate sodium (COLACE) 100 MG capsule Take 1-2 capsules (100-200 mg total) by mouth every morning. As directed for constipation  30 capsule  1  . ibuprofen (ADVIL,MOTRIN) 800 MG tablet TAKE 1 TABLET (800 MG TOTAL) BY MOUTH EVERY 8 (EIGHT) HOURS AS NEEDED FOR PAIN.  30 tablet  2  . LORazepam (ATIVAN) 0.5 MG tablet Take 1 tablet (0.5 mg total) by mouth at bedtime as needed for anxiety (Nausea or vomiting).  30 tablet  0  . oxyCODONE-acetaminophen (ROXICET) 5-325 MG per tablet Take 1-2 tablets by mouth every 4 (four) hours as needed for pain.  30 tablet  0  . polyethylene glycol powder (MIRALAX) powder Take 17 g by mouth daily as needed (constipation).  255 g  1  . propranolol ER (INDERAL LA) 120 MG 24 hr capsule TAKE 1  CAPSULE (120 MG TOTAL) BY MOUTH DAILY.  93 capsule  1   No current facility-administered medications for this visit.    OBJECTIVE: Middle-aged [place] American woman who appears stated age and is in no acute distress Filed Vitals:   04/19/13 1005  BP: 103/68  Pulse: 84  Temp: 98.7 F (37.1 C)  Resp: 18     Body mass index is 23.11 kg/(m^2).    ECOG FS: 1 Filed Weights   04/19/13 1005  Weight: 143 lb 1.6 oz (64.91 kg)   Physical Exam: HEENT:  Sclerae anicteric.  Oropharynx clear. No evidence of ulcerations or mucositis.  NODES:  No cervical or supraclavicular lymphadenopathy palpated.  BREAST EXAM: Deferred.  Axillae are benign, with no palpable adenopathy. LUNGS:  Clear to auscultation bilaterally.  No wheezes or rhonchi. Good excursion bilaterally. HEART:  Regular rate and rhythm. No murmur appreciated. ABDOMEN:  Soft, nontender.  Positive bowel sounds.  MSK:  No focal spinal tenderness to palpation. Full range of motion in the upper extremities. EXTREMITIES:  No peripheral edema.   SKIN: Port is intact in the left upper  chest wall with no erythema, edema, or evidence of infection.   Neuro: Nonfocal.  Well oriented.  Positive  affect.   LAB RESULTS:  Lab Results  Component Value Date   WBC 0.9* 04/19/2013   NEUTROABS 0.4* 04/19/2013   HGB 10.4* 04/19/2013   HCT 31.5* 04/19/2013   MCV 84.7 04/19/2013   PLT 185 04/19/2013      Chemistry      Component Value Date/Time   NA 138 04/19/2013 0951   NA 138 06/16/2011 0938   K 4.2 04/19/2013 0951   K 4.1 06/16/2011 0938   CL 110 06/16/2011 0938   CO2 28 04/19/2013 0951   CO2 26 06/16/2011 0938   BUN 8.8 04/19/2013 0951   BUN 10 06/16/2011 0938   CREATININE 0.7 04/19/2013 0951   CREATININE 0.81 06/16/2011 0938   CREATININE 0.74 07/26/2010 2332      Component Value Date/Time   CALCIUM 9.1 04/19/2013 0951   CALCIUM 9.5 06/16/2011 0938   ALKPHOS 116 04/19/2013 0951   ALKPHOS 78 06/16/2011 0938   AST 10 04/19/2013 0951   AST 13 06/16/2011 0938   ALT 13 04/19/2013 0951   ALT 10 06/16/2011 0938   BILITOT 0.51 04/19/2013 0951   BILITOT 0.5 06/16/2011 0938      STUDIES:  Echocardiogram on 03/08/2013 showed an ejection fraction of 55-60%.     ASSESSMENT: 52 y.o. Ana Young woman   (1)  status post left breast upper outer quadrant biopsy 02/22/2013 for a clinical T2 N0, stage IIA invasive ductal carcinoma, grade 3, triple negative, with an MIB-1 of 100%.    (2) MRI on 03/02/2013 showed an additional nodule at the 2:00 position, 5 mm lateral to the primary in the left breast, measuring 10 x 6 x 9 mm. This was biopsied on 03/11/2013 and was consistent with high-grade carcinoma [id number] Prognostic panel is pending.   (3) MRI on 03/02/2013 also showed 2 areas of irregularity in the right breast which were worrisome for possible DCIS. Both of these areas were biopsied on 03/04/2013, and both showed fibrocystic changes with no evidence of malignancy ([id number]).   (4) genetics testing is pending  (5)  Being treated in the neoadjuvant  setting, the plan being to treat with 4 dose dense cycles of doxorubicin/cyclophosphamide, with the first dose given on 03/15/2013. Patient receives Neulasta on day 2 of each cycle for  granulocyte support. These 4 cycles will be followed by 12 weekly doses of carboplatin/paclitaxel given in standard dose, with all neoadjuvant chemotherapy to be completed prior to definitive surgery.    PLAN: Alayza continues to tolerate the doxorubicin/cyclophosphamide well, and return next week on October 21 in anticipation of her fourth and final cycle of this combination. We will repeat her breast MRI in late October to assess response to therapy. I requested an appointment with Dr. Donell Beers sooner after to review those results and begin discussion of her definitive surgery. We then hope to initiate the first of 12 planned weekly doses of neoadjuvant carboplatin/paclitaxel on November 4.   In the meanwhile, she will start back on Cipro prophylactically for the next 7 days for chemotherapy-induced neutropenia. She'll take 500 mg twice daily for 7 days, and also knows to call with any fevers of 100 or above. We again reviewed neutropenic precautions today.   Ana Young voices understanding and agreement with our plan, and knows to call with any changes or problems prior to her next scheduled appointment.   Ana Hemminger, PA-C   04/19/2013 10:42 AM

## 2013-04-19 NOTE — Telephone Encounter (Signed)
Per staff message and POF I have scheduled appts.  JMW  

## 2013-04-26 ENCOUNTER — Ambulatory Visit (HOSPITAL_BASED_OUTPATIENT_CLINIC_OR_DEPARTMENT_OTHER): Payer: BC Managed Care – PPO

## 2013-04-26 ENCOUNTER — Other Ambulatory Visit (HOSPITAL_BASED_OUTPATIENT_CLINIC_OR_DEPARTMENT_OTHER): Payer: BC Managed Care – PPO | Admitting: Lab

## 2013-04-26 ENCOUNTER — Ambulatory Visit (HOSPITAL_BASED_OUTPATIENT_CLINIC_OR_DEPARTMENT_OTHER): Payer: BC Managed Care – PPO | Admitting: Physician Assistant

## 2013-04-26 ENCOUNTER — Telehealth: Payer: Self-pay | Admitting: *Deleted

## 2013-04-26 ENCOUNTER — Encounter: Payer: Self-pay | Admitting: Physician Assistant

## 2013-04-26 ENCOUNTER — Encounter (INDEPENDENT_AMBULATORY_CARE_PROVIDER_SITE_OTHER): Payer: Self-pay

## 2013-04-26 VITALS — BP 143/93 | HR 97 | Temp 98.3°F | Resp 18 | Ht 66.0 in | Wt 145.7 lb

## 2013-04-26 DIAGNOSIS — C50419 Malignant neoplasm of upper-outer quadrant of unspecified female breast: Secondary | ICD-10-CM

## 2013-04-26 DIAGNOSIS — C50412 Malignant neoplasm of upper-outer quadrant of left female breast: Secondary | ICD-10-CM

## 2013-04-26 DIAGNOSIS — F411 Generalized anxiety disorder: Secondary | ICD-10-CM

## 2013-04-26 DIAGNOSIS — Z171 Estrogen receptor negative status [ER-]: Secondary | ICD-10-CM

## 2013-04-26 DIAGNOSIS — Z5111 Encounter for antineoplastic chemotherapy: Secondary | ICD-10-CM

## 2013-04-26 DIAGNOSIS — R110 Nausea: Secondary | ICD-10-CM

## 2013-04-26 LAB — CBC WITH DIFFERENTIAL/PLATELET
Basophils Absolute: 0.1 10*3/uL (ref 0.0–0.1)
EOS%: 2 % (ref 0.0–7.0)
Eosinophils Absolute: 0.3 10*3/uL (ref 0.0–0.5)
HCT: 31.1 % — ABNORMAL LOW (ref 34.8–46.6)
HGB: 10.3 g/dL — ABNORMAL LOW (ref 11.6–15.9)
MCH: 27.8 pg (ref 25.1–34.0)
MONO#: 1.2 10*3/uL — ABNORMAL HIGH (ref 0.1–0.9)
MONO%: 9.5 % (ref 0.0–14.0)
NEUT#: 10.1 10*3/uL — ABNORMAL HIGH (ref 1.5–6.5)
RDW: 14.5 % (ref 11.2–14.5)
WBC: 13.1 10*3/uL — ABNORMAL HIGH (ref 3.9–10.3)
lymph#: 1.4 10*3/uL (ref 0.9–3.3)

## 2013-04-26 MED ORDER — LORAZEPAM 1 MG PO TABS
ORAL_TABLET | ORAL | Status: AC
  Filled 2013-04-26: qty 1

## 2013-04-26 MED ORDER — PALONOSETRON HCL INJECTION 0.25 MG/5ML
INTRAVENOUS | Status: AC
  Filled 2013-04-26: qty 5

## 2013-04-26 MED ORDER — PALONOSETRON HCL INJECTION 0.25 MG/5ML
0.2500 mg | Freq: Once | INTRAVENOUS | Status: AC
  Administered 2013-04-26: 0.25 mg via INTRAVENOUS

## 2013-04-26 MED ORDER — HEPARIN SOD (PORK) LOCK FLUSH 100 UNIT/ML IV SOLN
500.0000 [IU] | Freq: Once | INTRAVENOUS | Status: AC | PRN
  Administered 2013-04-26: 500 [IU]
  Filled 2013-04-26: qty 5

## 2013-04-26 MED ORDER — SODIUM CHLORIDE 0.9 % IJ SOLN
10.0000 mL | INTRAMUSCULAR | Status: DC | PRN
  Administered 2013-04-26: 10 mL
  Filled 2013-04-26: qty 10

## 2013-04-26 MED ORDER — DEXAMETHASONE SODIUM PHOSPHATE 20 MG/5ML IJ SOLN
12.0000 mg | Freq: Once | INTRAMUSCULAR | Status: AC
  Administered 2013-04-26: 12 mg via INTRAVENOUS

## 2013-04-26 MED ORDER — LORAZEPAM 1 MG PO TABS
0.5000 mg | ORAL_TABLET | Freq: Once | ORAL | Status: AC
  Administered 2013-04-26: 0.5 mg via SUBLINGUAL

## 2013-04-26 MED ORDER — SODIUM CHLORIDE 0.9 % IV SOLN
150.0000 mg | Freq: Once | INTRAVENOUS | Status: AC
  Administered 2013-04-26: 150 mg via INTRAVENOUS
  Filled 2013-04-26: qty 5

## 2013-04-26 MED ORDER — SODIUM CHLORIDE 0.9 % IV SOLN
Freq: Once | INTRAVENOUS | Status: AC
  Administered 2013-04-26: 13:00:00 via INTRAVENOUS

## 2013-04-26 MED ORDER — DEXAMETHASONE SODIUM PHOSPHATE 20 MG/5ML IJ SOLN
INTRAMUSCULAR | Status: AC
  Filled 2013-04-26: qty 5

## 2013-04-26 MED ORDER — SODIUM CHLORIDE 0.9 % IV SOLN
600.0000 mg/m2 | Freq: Once | INTRAVENOUS | Status: AC
  Administered 2013-04-26: 1060 mg via INTRAVENOUS
  Filled 2013-04-26: qty 53

## 2013-04-26 MED ORDER — DOXORUBICIN HCL CHEMO IV INJECTION 2 MG/ML
60.0000 mg/m2 | Freq: Once | INTRAVENOUS | Status: AC
  Administered 2013-04-26: 106 mg via INTRAVENOUS
  Filled 2013-04-26: qty 53

## 2013-04-26 NOTE — Progress Notes (Signed)
ID: Ana Young OB: 18-Jun-1961  MR#: [id number]  CSN#:[id number]  PCP: Maryjean Ka, MD GYN:   SU: Almond Lint OTHER MD: Lurline Hare, Paula Compton  CHIEF COMPLAINT:  Left  Breast Cancer  HISTORY OF PRESENT ILLNESS: Ana Young herself palpated a mass in her left breast 01/15/2013 and brought it to the attention of Dr. Mauricio Po, who arranged for her to have bilateral digital mammography at the breast Center 01/27/2013. The patient does have heterogeneously dense breasts. In the outer upper left breast there was an oval mass by mammography measuring approximately 1.5 cm, which by palpation was firm and measured approximately 1 cm. Ultrasound showed this to be hypoechoic and to measure 1.4 cm.  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  The patient then underwent bilateral breast MRI 02/28/2013. In the left breast upper-outer quadrant the mass in question measured 2.9 cm. There was a second mass lateral to it measuring 1.0 cm. There were no additional abnormal appearing lymph nodes and no evidence of internal mammary adenopathy.  In the right breast there were 2 areas of irregular enhancement in the superior central third of the breast, both measuring 9 mm. These were felt to be worrisome for a ductal carcinoma in situ.  The patient's case was discussed at the multidisciplinary breast cancer conference 03/02/2013. Her subsequent history is as detailed below.  INTERVAL HISTORY: Ana Young returns today accompanied by her husband Ana Young for followup of her locally advanced left breast carcinoma. She is due for her fourth and final planned dose dense cycles of doxorubicin/cyclophosphamide today, given in the neoadjuvant setting. She receives Neulasta on day 2 for granulocyte support.  Ana Young is tolerating treatment well overall. She does feel increasingly  tired. Her skin is slightly hyperpigmented, as are her nail beds. She's had no sensitivity of the nailbeds, and no drainage or evidence of infection. She's been using A&D ointment on her hands and feet which has helped with the dry skin and previous sensitivity. She does feel little anxious today, and is slightly nauseous here in the office.  Ana Young completed 7 days of Cipro prophylactically, and has had no fevers, chills, or night sweats. Her counts have recovered nicely.   REVIEW OF SYSTEMS: Ana Young denies any abnormal bruising or bleeding. Her appetite has been good. She denies any mouth ulcers or oral sensitivity. She's had only a couple episodes of nausea, but no emesis, and she denies any change in bowel or bladder habits.  She denies any cough, increased shortness of breath, orthopnea, palpitations or chest pain. She's had no abnormal headaches, change in vision, or dizziness, and also denies any current myalgias, arthralgias, or bony pain. She's had no peripheral swelling or signs of peripheral neuropathy.  A detailed review of systems is otherwise stable and noncontributory.    PAST MEDICAL HISTORY: Past Medical History  Diagnosis Date  . Depression   . Vitamin D deficiency   . Anxiety   . Seasonal allergies   . HTN (hypertension)   . Insomnia   . History of migraines   . Cancer     Left breast cancer    PAST SURGICAL HISTORY: Past Surgical History  Procedure Laterality Date  . Bunionectomy Left 2009  . Portacath placement Left 03/09/2013    Procedure: INSERTION PORT-A-CATH;  Surgeon: Almond Lint, MD;  Location: [facility];  Service: General;  Laterality: Left;  FAMILY HISTORY Family History  Problem Relation Age of Onset  . Heart attack Brother   . Congestive Heart Failure Mother   . Stroke Father   . Diabetes Father   . Breast cancer Maternal Grandmother 31   the patient's parents are in their 38s. The patient has one brother, no sisters. The patient's  maternal grandmother was diagnosed with breast cancer the age of 67. There is no other history of breast cancer in the family to the patient's knowledge, and no history of ovarian cancer  GYNECOLOGIC HISTORY:  Menarche age 89, first live birth age 3, the patient is GX P2. Her periods are not irregular, but still every 2-3 months, and otherwise unremarkable.  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 Her husband Ana Young works as a Actuary. Son Ana Young lives in [place] and works in Engineering geologist. Daughter Ana Young is 33 year old and at home. The patient has no grandchildren. She attends the [employer]    ADVANCED DIRECTIVES: Not in place  HEALTH MAINTENANCE:  (Updated 04/26/2013) History  Substance Use Topics  . Smoking status: Never Smoker   . Smokeless tobacco: Never Used  . Alcohol Use: No     Colonoscopy: - Never  PAP: Feb 2012, Dr. Jeanice Lim  Bone density: - Never  Lipid panel:  Dec 2012, Dr. Ashley Royalty   No Known Allergies  Current Outpatient Prescriptions  Medication Sig Dispense Refill  . ALPRAZolam (XANAX) 1 MG tablet Take 1 mg by mouth 2 (two) times daily as needed for sleep (1/2 tablet bid as needed).      Marland Kitchen buPROPion (WELLBUTRIN XL) 300 MG 24 hr tablet TAKE 1 TABLET (300 MG TOTAL) BY MOUTH EVERY MORNING.  30 tablet  3  . ciprofloxacin (CIPRO) 500 MG tablet Take 1 tablet (500 mg total) by mouth 2 (two) times daily. X 7 days  14 tablet  3  . dexamethasone (DECADRON) 4 MG tablet 2 tabs by mouth with food, twice daily x 3 days after each chemo  30 tablet  1  . docusate sodium (COLACE) 100 MG capsule Take 1-2 capsules (100-200 mg total) by mouth every morning. As directed for constipation  30 capsule  1  . ibuprofen (ADVIL,MOTRIN) 800 MG tablet TAKE 1 TABLET (800 MG TOTAL) BY MOUTH EVERY 8 (EIGHT) HOURS AS NEEDED FOR PAIN.   30 tablet  2  . lidocaine-prilocaine (EMLA) cream Apply topically as needed. For chemotherapy  30 g  0  . LORazepam (ATIVAN) 0.5 MG tablet Take 1 tablet (0.5 mg total) by mouth at bedtime as needed for anxiety (Nausea or vomiting).  30 tablet  0  . oxyCODONE-acetaminophen (ROXICET) 5-325 MG per tablet Take 1-2 tablets by mouth every 4 (four) hours as needed for pain.  30 tablet  0  . polyethylene glycol powder (MIRALAX) powder Take 17 g by mouth daily as needed (constipation).  255 g  1  . prochlorperazine (COMPAZINE) 10 MG tablet 1 tab by mouth with meals and bedtime x 3 days after chemo, then 1 tab by mouth every 6 hrs PRN nausea  30 tablet  2  . propranolol ER (INDERAL LA) 120 MG 24 hr capsule TAKE 1 CAPSULE (120 MG TOTAL) BY MOUTH DAILY.  93 capsule  1  . zolpidem (AMBIEN) 5 MG tablet TAKE 1 TABLET BY MOUTH AT BEDTIME AS NEEDED FOR SLEEP  30 tablet  0   No current facility-administered medications for this visit.    OBJECTIVE: Middle-aged [place] American woman who appears stated age, in no acute distress Filed Vitals:   04/26/13 1201  BP: 143/93  Pulse: 97  Temp: 98.3 F (36.8 C)  Resp: 18     Body mass index is 23.53 kg/(m^2).    ECOG FS: 1 Filed Weights   04/26/13 1201  Weight: 145 lb 11.2 oz (66.089 kg)   Physical Exam: HEENT:  Sclerae anicteric.  Oropharynx clear. No evidence of ulcerations or mucositis.  NODES:  No cervical or supraclavicular lymphadenopathy palpated.  BREAST EXAM: Right breast is status post biopsy with some residual swelling, improved. No suspicious masses or skin changes. I was unable to palpate a distinct mass in the left breast, only some very slight thickening of the tissue.  Axillae are benign, with no palpable adenopathy. LUNGS:  Clear to auscultation bilaterally.  No wheezes or rhonchi.  HEART:  Regular rate and rhythm.  ABDOMEN:  Soft, nontender.  Positive bowel sounds.  MSK:  No focal spinal tenderness to palpation. Full range of motion in the  upper extremities. EXTREMITIES:  No peripheral edema.   SKIN: Port is intact in the left upper chest wall with no erythema, edema, or evidence of infection.  Skin and nailbeds are hyperpigmented on the upper extremities bilaterally. Neuro: Nonfocal.  Well oriented.  Appropriate  affect.   LAB RESULTS:  Lab Results  Component Value Date   WBC 13.1* 04/26/2013   NEUTROABS 10.1* 04/26/2013   HGB 10.3* 04/26/2013   HCT 31.1* 04/26/2013   MCV 83.8 04/26/2013   PLT 308 04/26/2013      Chemistry      Component Value Date/Time   NA 138 04/19/2013 0951   NA 138 06/16/2011 0938   K 4.2 04/19/2013 0951   K 4.1 06/16/2011 0938   CL 110 06/16/2011 0938   CO2 28 04/19/2013 0951   CO2 26 06/16/2011 0938   BUN 8.8 04/19/2013 0951   BUN 10 06/16/2011 0938   CREATININE 0.7 04/19/2013 0951   CREATININE 0.81 06/16/2011 0938   CREATININE 0.74 07/26/2010 2332      Component Value Date/Time   CALCIUM 9.1 04/19/2013 0951   CALCIUM 9.5 06/16/2011 0938   ALKPHOS 116 04/19/2013 0951   ALKPHOS 78 06/16/2011 0938   AST 10 04/19/2013 0951   AST 13 06/16/2011 0938   ALT 13 04/19/2013 0951   ALT 10 06/16/2011 0938   BILITOT 0.51 04/19/2013 0951   BILITOT 0.5 06/16/2011 0938      STUDIES:  Echocardiogram on 03/08/2013 showed an ejection fraction of 55-60%.     ASSESSMENT: 52 y.o. Ana Young woman   (1)  status post left breast upper outer quadrant biopsy 02/22/2013 for a clinical T2 N0, stage IIA invasive ductal carcinoma, grade 3, triple negative, with an MIB-1 of 100%.    (2) MRI on 03/02/2013 showed an additional nodule at the 2:00 position, 5 mm lateral to the primary in the left breast, measuring 10 x 6 x 9 mm. This was biopsied on 03/11/2013 and was consistent with high-grade carcinoma [phone]). Prognostic panel is pending.   (3) MRI on 03/02/2013 also showed 2 areas of irregularity in the right breast which were worrisome for possible DCIS. Both of these areas were biopsied on  03/04/2013, and both showed fibrocystic changes with no evidence of malignancy ([id number]).   (4) genetics testing is pending  (5)  Being treated in the  neoadjuvant setting, the plan being to treat with 4 dose dense cycles of doxorubicin/cyclophosphamide, with the first dose given on 03/15/2013. Patient receives Neulasta on day 2 of each cycle for granulocyte support. These 4 cycles will be followed by 12 weekly doses of carboplatin/paclitaxel given in standard dose, with all neoadjuvant chemotherapy to be completed prior to definitive surgery.    PLAN: Shirlean will proceed to treatment today as scheduled for her fourth and final scheduled dose of dose dense doxorubicin/cyclophosphamide. I have added an order for sublingual lorazepam, 0.5 mg, to help with her mild anxiety and nausea today prior to treatment. She will receive her Neulasta injection tomorrow.  [name] is scheduled for a repeat breast MRI next Monday, October 27, to assess response to neoadjuvant chemotherapy. I will see her the following day to assess tolerance to chemotherapy, and to review the MRI results. She's already scheduled to see her surgeon, Dr. Donell Beers, the following week on November 3 for further discussion of her definitive surgery.   Kitana has a test to take in [place] on November 7. We are planning on initiating her first weekly dose of carboplatin/paclitaxel on the Tuesday prior, November 4, and she is worried about how she might feel later in the week. She has asked that we delay treatment for one week, so we will plan on initiating her first dose of carboplatin/paclitaxel accordingly on November 11. She is scheduled to see Dr. Darnelle Catalan that day as well.  [name] voices understanding and agreement with our plan, and knows to call with any changes or problems prior to her next scheduled appointment.   Keyona Emrich, PA-C   04/26/2013 12:22 PM

## 2013-04-26 NOTE — Telephone Encounter (Signed)
Per staff message and POF I have scheduled appts.  JMW  

## 2013-04-26 NOTE — Telephone Encounter (Signed)
appts made and printed. Pt is aware that tx's will be added. i emailed MW to add tx's...td

## 2013-04-26 NOTE — Patient Instructions (Signed)
[facility] Discharge Instructions for Patients Receiving Chemotherapy  Today you received the following chemotherapy agents Adriamycin and Cytoxan.  To help prevent nausea and vomiting after your treatment, we encourage you to take your nausea medication.   If you develop nausea and vomiting that is not controlled by your nausea medication, call the clinic.   BELOW ARE SYMPTOMS THAT SHOULD BE REPORTED IMMEDIATELY:  *FEVER GREATER THAN 100.5 F  *CHILLS WITH OR WITHOUT FEVER  NAUSEA AND VOMITING THAT IS NOT CONTROLLED WITH YOUR NAUSEA MEDICATION  *UNUSUAL SHORTNESS OF BREATH  *UNUSUAL BRUISING OR BLEEDING  TENDERNESS IN MOUTH AND THROAT WITH OR WITHOUT PRESENCE OF ULCERS  *URINARY PROBLEMS  *BOWEL PROBLEMS  UNUSUAL RASH Items with * indicate a potential emergency and should be followed up as soon as possible.  Feel free to call the clinic you have any questions or concerns. The clinic phone number is [phone].    

## 2013-04-27 ENCOUNTER — Ambulatory Visit (HOSPITAL_BASED_OUTPATIENT_CLINIC_OR_DEPARTMENT_OTHER): Payer: BC Managed Care – PPO

## 2013-04-27 VITALS — BP 128/70 | HR 93 | Temp 98.2°F

## 2013-04-27 DIAGNOSIS — C50419 Malignant neoplasm of upper-outer quadrant of unspecified female breast: Secondary | ICD-10-CM

## 2013-04-27 DIAGNOSIS — C50412 Malignant neoplasm of upper-outer quadrant of left female breast: Secondary | ICD-10-CM

## 2013-04-27 DIAGNOSIS — Z5189 Encounter for other specified aftercare: Secondary | ICD-10-CM

## 2013-04-27 MED ORDER — PEGFILGRASTIM INJECTION 6 MG/0.6ML
6.0000 mg | Freq: Once | SUBCUTANEOUS | Status: AC
  Administered 2013-04-27: 6 mg via SUBCUTANEOUS
  Filled 2013-04-27: qty 0.6

## 2013-05-02 ENCOUNTER — Ambulatory Visit
Admission: RE | Admit: 2013-05-02 | Discharge: 2013-05-02 | Disposition: A | Discharge status: Home / Self Care | Payer: BC Managed Care – PPO | Source: Ambulatory Visit | Attending: Physician Assistant | Admitting: Physician Assistant

## 2013-05-02 DIAGNOSIS — C50412 Malignant neoplasm of upper-outer quadrant of left female breast: Secondary | ICD-10-CM

## 2013-05-02 MED ORDER — GADOBENATE DIMEGLUMINE 529 MG/ML IV SOLN
13.0000 mL | Freq: Once | INTRAVENOUS | Status: AC | PRN
  Administered 2013-05-02: 13 mL via INTRAVENOUS

## 2013-05-03 ENCOUNTER — Ambulatory Visit (HOSPITAL_BASED_OUTPATIENT_CLINIC_OR_DEPARTMENT_OTHER): Payer: BC Managed Care – PPO | Admitting: Physician Assistant

## 2013-05-03 ENCOUNTER — Other Ambulatory Visit (HOSPITAL_BASED_OUTPATIENT_CLINIC_OR_DEPARTMENT_OTHER): Payer: BC Managed Care – PPO | Admitting: Lab

## 2013-05-03 ENCOUNTER — Encounter: Payer: Self-pay | Admitting: Physician Assistant

## 2013-05-03 ENCOUNTER — Telehealth: Payer: Self-pay | Admitting: Oncology

## 2013-05-03 VITALS — BP 105/72 | HR 94 | Temp 98.4°F | Resp 19 | Ht 66.0 in | Wt 143.3 lb

## 2013-05-03 DIAGNOSIS — R110 Nausea: Secondary | ICD-10-CM

## 2013-05-03 DIAGNOSIS — F329 Major depressive disorder, single episode, unspecified: Secondary | ICD-10-CM

## 2013-05-03 DIAGNOSIS — C50419 Malignant neoplasm of upper-outer quadrant of unspecified female breast: Secondary | ICD-10-CM

## 2013-05-03 DIAGNOSIS — C50412 Malignant neoplasm of upper-outer quadrant of left female breast: Secondary | ICD-10-CM

## 2013-05-03 DIAGNOSIS — Z171 Estrogen receptor negative status [ER-]: Secondary | ICD-10-CM

## 2013-05-03 LAB — COMPREHENSIVE METABOLIC PANEL (CC13)
Alkaline Phosphatase: 118 U/L (ref 40–150)
Anion Gap: 9 mEq/L (ref 3–11)
BUN: 7 mg/dL (ref 7.0–26.0)
CO2: 25 mEq/L (ref 22–29)
Calcium: 9.3 mg/dL (ref 8.4–10.4)
Creatinine: 0.7 mg/dL (ref 0.6–1.1)
Glucose: 87 mg/dl (ref 70–140)
Total Bilirubin: 0.36 mg/dL (ref 0.20–1.20)
Total Protein: 6.5 g/dL (ref 6.4–8.3)

## 2013-05-03 LAB — CBC WITH DIFFERENTIAL/PLATELET
Basophils Absolute: 0 10*3/uL (ref 0.0–0.1)
Eosinophils Absolute: 0 10*3/uL (ref 0.0–0.5)
HCT: 29.8 % — ABNORMAL LOW (ref 34.8–46.6)
LYMPH%: 39.5 % (ref 14.0–49.7)
MCH: 27.9 pg (ref 25.1–34.0)
MCV: 85.1 fL (ref 79.5–101.0)
MONO#: 0.1 10*3/uL (ref 0.1–0.9)
MONO%: 8.9 % (ref 0.0–14.0)
NEUT#: 0.5 10*3/uL — CL (ref 1.5–6.5)
NEUT%: 47.7 % (ref 38.4–76.8)
Platelets: 202 10*3/uL (ref 145–400)
RBC: 3.5 10*6/uL — ABNORMAL LOW (ref 3.70–5.45)
WBC: 1 10*3/uL — ABNORMAL LOW (ref 3.9–10.3)

## 2013-05-03 MED ORDER — ONDANSETRON HCL 8 MG PO TABS: 8.0000 mg | ORAL_TABLET | Freq: Two times a day (BID) | ORAL | Status: DC

## 2013-05-03 NOTE — Progress Notes (Signed)
ID: [name] OB: 1960/12/21  MR#: [id number]  CSN#:[id number]  PCP: Maryjean Ka, MD GYN:   SU: Almond Lint OTHER MD: Lurline Hare, Paula Compton  CHIEF COMPLAINT:  Left  Breast Cancer  HISTORY OF PRESENT ILLNESS: Ana Young herself palpated a mass in her left breast 01/15/2013 and brought it to the attention of Dr. Mauricio Po, who arranged for her to have bilateral digital mammography at the breast Center 01/27/2013. The patient does have heterogeneously dense breasts. In the outer upper left breast there was an oval mass by mammography measuring approximately 1.5 cm, which by palpation was firm and measured approximately 1 cm. Ultrasound showed this to be hypoechoic and to measure 1.4 cm.  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  The patient then underwent bilateral breast MRI 02/28/2013. In the left breast upper-outer quadrant the mass in question measured 2.9 cm. There was a second mass lateral to it measuring 1.0 cm. There were no additional abnormal appearing lymph nodes and no evidence of internal mammary adenopathy.  In the right breast there were 2 areas of irregular enhancement in the superior central third of the breast, both measuring 9 mm. These were felt to be worrisome for a ductal carcinoma in situ.  The patient's case was discussed at the multidisciplinary breast cancer conference 03/02/2013. Her subsequent history is as detailed below.  INTERVAL HISTORY: Ana Young returns today accompanied by her husband [name] for followup of her locally advanced left breast carcinoma. She is currently day 8 cycle 4 of 4 planned dose dense cycles of doxorubicin/cyclophosphamide today, given in the neoadjuvant setting. She receives Neulasta on day 2 for granulocyte support.  Ana Young had a repeat breast MRI yesterday, 05/02/2013, which showed an  excellent response to neoadjuvant chemotherapy with no residual enhancing masses in the left breast. This will be detailed below. She is thrilled with these results as she should be!  Overall, Ana Young is feeling well. She's had only some occasional fleeting episodes of nausea for which she took her antinausea medication affectively. She's had no emesis. She's eating and drinking reasonably well. Her nails and hands are darker, but she denies any pain or sensitivity. She's had some dry skin and slight cracking of the soles of her feet, and is keeping them well moisturized which is helping. She's had no signs of peripheral neuropathy.    REVIEW OF SYSTEMS: Ana Young denies any fevers, chills, or night sweats. She's had no rashes, abnormal bruising or bleeding. Her appetite has been slightly reduced. She denies any mouth ulcers or oral sensitivity. She denies any change in bowel or bladder habits.  She denies any cough, increased shortness of breath, orthopnea, palpitations, peripheral swelling or chest pain. She's had no abnormal headaches, change in vision, or dizziness, and also denies any current myalgias, arthralgias, or bony pain. She currently denies any signs of peripheral neuropathy.  A detailed review of systems is otherwise stable and noncontributory.    PAST MEDICAL HISTORY: Past Medical History  Diagnosis Date  . Depression   . Vitamin D deficiency   . Anxiety   . Seasonal allergies   . HTN (hypertension)   . Insomnia   . History of migraines   . Cancer     Left breast cancer    PAST SURGICAL HISTORY: Past Surgical History  Procedure Laterality Date  . Bunionectomy Left 2009  . Portacath placement Left 03/09/2013  Procedure: INSERTION PORT-A-CATH;  Surgeon: Almond Lint, MD;  Location: [facility];  Service: General;  Laterality: Left;    FAMILY HISTORY Family History  Problem Relation Age of Onset  . Heart attack Brother   . Congestive Heart Failure Mother    . Stroke Father   . Diabetes Father   . Breast cancer Maternal Grandmother 23   the patient's parents are in their 71s. The patient has one brother, no sisters. The patient's maternal grandmother was diagnosed with breast cancer the age of 28. There is no other history of breast cancer in the family to the patient's knowledge, and no history of ovarian cancer  GYNECOLOGIC HISTORY:  Menarche age 82, first live birth age 31, the patient is GX P2. Her periods are not irregular, but still every 2-3 months, and otherwise unremarkable.  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 Her husband [name] works as a Actuary. Son [name] lives in [place] and works in Engineering geologist. Daughter [name] is 19 year old and at home. The patient has no grandchildren. She attends the [employer]    ADVANCED DIRECTIVES: Not in place  HEALTH MAINTENANCE:  (Updated 04/26/2013) History  Substance Use Topics  . Smoking status: Never Smoker   . Smokeless tobacco: Never Used  . Alcohol Use: No     Colonoscopy: - Never  PAP: Feb 2012, Dr. Jeanice Lim  Bone density: - Never  Lipid panel:  Dec 2012, Dr. Ashley Royalty   No Known Allergies  Current Outpatient Prescriptions  Medication Sig Dispense Refill  . ALPRAZolam (XANAX) 1 MG tablet Take 1 mg by mouth 2 (two) times daily as needed for sleep (1/2 tablet bid as needed).      Marland Kitchen buPROPion (WELLBUTRIN XL) 300 MG 24 hr tablet TAKE 1 TABLET (300 MG TOTAL) BY MOUTH EVERY MORNING.  30 tablet  3  . docusate sodium (COLACE) 100 MG capsule Take 1-2 capsules (100-200 mg total) by mouth every morning. As directed for constipation  30 capsule  1  . ibuprofen (ADVIL,MOTRIN) 800 MG tablet TAKE 1 TABLET (800 MG TOTAL) BY MOUTH EVERY 8 (EIGHT) HOURS AS NEEDED FOR PAIN.  30 tablet  2  . lidocaine-prilocaine (EMLA) cream Apply  topically as needed. For chemotherapy  30 g  0  . LORazepam (ATIVAN) 0.5 MG tablet Take 1 tablet (0.5 mg total) by mouth at bedtime as needed for anxiety (Nausea or vomiting).  30 tablet  0  . oxyCODONE-acetaminophen (ROXICET) 5-325 MG per tablet Take 1-2 tablets by mouth every 4 (four) hours as needed for pain.  30 tablet  0  . polyethylene glycol powder (MIRALAX) powder Take 17 g by mouth daily as needed (constipation).  255 g  1  . propranolol ER (INDERAL LA) 120 MG 24 hr capsule TAKE 1 CAPSULE (120 MG TOTAL) BY MOUTH DAILY.  93 capsule  1  . zolpidem (AMBIEN) 5 MG tablet TAKE 1 TABLET BY MOUTH AT BEDTIME AS NEEDED FOR SLEEP  30 tablet  0  . ciprofloxacin (CIPRO) 500 MG tablet Take 1 tablet (500 mg total) by mouth 2 (two) times daily. X 7 days  14 tablet  3  . ondansetron (ZOFRAN) 8 MG tablet Take 1 tablet (8 mg total) by mouth 2 (two) times daily. 1 tab by mouth twice daily x 2 days after chemo, then 1 tab  every 12 hrs PRN nausea  30 tablet  1   No current facility-administered medications for this visit.    OBJECTIVE: Middle-aged [place] American woman  in no acute distress Filed Vitals:   05/03/13 1310  BP: 105/72  Pulse: 94  Temp: 98.4 F (36.9 C)  Resp: 19     Body mass index is 23.14 kg/(m^2).    ECOG FS: 1 Filed Weights   05/03/13 1310  Weight: 143 lb 4.8 oz (65 kg)   Physical Exam: HEENT:  Sclerae anicteric.  Oropharynx clear. No evidence of ulcerations or mucositis. Buccal mucosa is pink and moist. NODES:  No cervical or supraclavicular lymphadenopathy palpated.  BREAST EXAM: Deferred.  Axillae are benign, with no palpable adenopathy. LUNGS:  Clear to auscultation bilaterally.  No wheezes or rhonchi.  HEART:  Regular rate and rhythm. No murmur appreciated ABDOMEN:  Soft, nontender.  Positive bowel sounds.  MSK:  No focal spinal tenderness to palpation. Full range of motion in the upper extremities. EXTREMITIES:  No peripheral edema.   SKIN: Port is intact in the left  upper chest wall with no erythema, edema, or evidence of infection.  Skin and nailbeds are hyperpigmented on the upper extremities bilaterally.  The soles of the feet were examined, and the skin is slightly dry with some peeling, but no actual cracking of the skin. This is most evident in the ball of the foot, and the need for toes. Neuro: Nonfocal.  Well oriented. Positive affect.   LAB RESULTS:  Lab Results  Component Value Date   WBC 1.0* 05/03/2013   NEUTROABS 0.5* 05/03/2013   HGB 9.7* 05/03/2013   HCT 29.8* 05/03/2013   MCV 85.1 05/03/2013   PLT 202 05/03/2013      Chemistry      Component Value Date/Time   NA 142 05/03/2013 1250   NA 138 06/16/2011 0938   K 4.1 05/03/2013 1250   K 4.1 06/16/2011 0938   CL 110 06/16/2011 0938   CO2 25 05/03/2013 1250   CO2 26 06/16/2011 0938   BUN 7.0 05/03/2013 1250   BUN 10 06/16/2011 0938   CREATININE 0.7 05/03/2013 1250   CREATININE 0.81 06/16/2011 0938   CREATININE 0.74 07/26/2010 2332      Component Value Date/Time   CALCIUM 9.3 05/03/2013 1250   CALCIUM 9.5 06/16/2011 0938   ALKPHOS 118 05/03/2013 1250   ALKPHOS 78 06/16/2011 0938   AST 10 05/03/2013 1250   AST 13 06/16/2011 0938   ALT 11 05/03/2013 1250   ALT 10 06/16/2011 0938   BILITOT 0.36 05/03/2013 1250   BILITOT 0.5 06/16/2011 0938      STUDIES:  Echocardiogram on 03/08/2013 showed an ejection fraction of 55-60%.   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 Patient is undergoing neoadjuvant chemotherapy. Assess response to neoadjuvant therapy.  EXAM: MR BILATERAL BREAST WITHOUT AND WITH CONTRAST  TECHNIQUE: Multiplanar, multisequence MR images of both breasts were obtained prior to and following the intravenous administration of 13ml of MultiHance.  THREE-DIMENSIONAL MR IMAGE RENDERING ON INDEPENDENT WORKSTATION:   Three-dimensional MR images were rendered by post-processing of the original MR data on an independent workstation. The three-dimensional MR images were interpreted, and findings are reported in the following complete MRI report for this study.  COMPARISON:  Previous exams  FINDINGS: Breast composition: c:  Heterogeneous fibroglandular tissue  Background  parenchymal enhancement: Mild to moderate.  Right breast: The previously described areas of clumped linear enhancement located within the central right breast which were shown to represent fibrocystic change on MR guided biopsies appear stable. There are no new enhancing foci within the right breast.  Left breast: The previously seen adjacent enhancing masses located within the superior left breast are no longer visible by MR and there is no abnormal enhancement in this region. There are no new enhancing foci.  Lymph nodes: No abnormal appearing lymph nodes.  Ancillary findings:  None.  IMPRESSION: Excellent response to neoadjuvant chemotherapy with no residual enhancing masses visible within the superior left breast. The study is otherwise unchanged.  RECOMMENDATION: Treatment plan.  BI-RADS CATEGORY  6: Known biopsy-proven malignancy - appropriate action should be taken.   Electronically Signed   By: Anselmo Pickler M.D.   On: 05/02/2013 15:46     ASSESSMENT: 52 y.o. Ana Young woman   (1)  status post left breast upper outer quadrant biopsy 02/22/2013 for a clinical T2 N0, stage IIA invasive ductal carcinoma, grade 3, triple negative, with an MIB-1 of 100%.    (2) MRI on 03/02/2013 showed an additional nodule at the 2:00 position, 5 mm lateral to the primary in the left breast, measuring 10 x 6 x 9 mm. This was biopsied on 03/11/2013 and was consistent with high-grade carcinoma [phone]). Prognostic panel is pending.   (3) MRI on 03/02/2013 also showed 2 areas of irregularity in the right breast which were worrisome for possible DCIS. Both of these  areas were biopsied on 03/04/2013, and both showed fibrocystic changes with no evidence of malignancy ([id number]).   (4) genetics testing is pending  (5)  Being treated in the neoadjuvant setting, the plan being to treat with 4 dose dense cycles of doxorubicin/cyclophosphamide, with the first dose given on 03/15/2013. Patient receives Neulasta on day 2 of each cycle for granulocyte support. These 4 cycles will be followed by 12 weekly doses of carboplatin/paclitaxel given in standard dose, with all neoadjuvant chemotherapy to be completed prior to definitive surgery.    PLAN: The majority of our 40 minute appointment today was spent reviewing Ana Young's recent MRI results, answering her questions, reviewing her treatment plan and her upcoming regimen of carboplatin/paclitaxel, and coordinating care.  Ana Young's response to neoadjuvant chemotherapy thus far has been great, and we did review the results of her recent breast MRI together. She scheduled to see Dr. Donell Beers next week to review these results. I will also have her stop by our office for repeat labs to followup on her neutropenia. We again reviewed neutropenic precautions. She'll start back on Cipro prophylactically, 500 mg by mouth twice a day for the next 7 days. As always, she knows to call us with any fevers 100 or above.   Hadessah is ready to initiate her next regimen, specifically 12 weekly doses of carboplatin/paclitaxel. She has a big test to take in [place] on November 7, so we are delaying her first dose of carboplatin/paclitaxel until the following week per her request. She will see Dr. Darnelle Catalan for labs and physical exam on November 11 in anticipation of day 1 cycle 1 of carboplatin/paclitaxel. We briefly reviewed her antinausea regimen for carboplatin/paclitaxel. Specifically, she will no longer take dexamethasone. She'll continue with prochlorperazine on the evening of chemotherapy, and 4 times daily for 2 days. She has lorazepam to take  as needed at night. Also gave her prescription for ondansetron which she can take up to twice daily  as needed for nausea. She has all of this information in writing.  We will continue to follow Ana Young very closely on a weekly basis. She voices understanding and agreement with the above, and will call with any changes or problems prior to her next appointment.   will proceed to treatment today as scheduled for her fourth and final scheduled dose of dose dense doxorubicin/cyclophosphamide. I have added an order for sublingual lorazepam, 0.5 mg, to help with her mild anxiety and nausea today prior to treatment. She will receive her Neulasta injection tomorrow.   Krystofer Hevener, PA-C   05/03/2013 3:18 PM

## 2013-05-03 NOTE — Telephone Encounter (Signed)
gv adn printed appt sched and avs for pt for NOV adn DEC °

## 2013-05-09 ENCOUNTER — Other Ambulatory Visit (HOSPITAL_BASED_OUTPATIENT_CLINIC_OR_DEPARTMENT_OTHER): Payer: BC Managed Care – PPO

## 2013-05-09 ENCOUNTER — Ambulatory Visit (INDEPENDENT_AMBULATORY_CARE_PROVIDER_SITE_OTHER): Payer: BC Managed Care – PPO | Admitting: General Surgery

## 2013-05-09 ENCOUNTER — Encounter (INDEPENDENT_AMBULATORY_CARE_PROVIDER_SITE_OTHER): Payer: Self-pay | Admitting: General Surgery

## 2013-05-09 VITALS — BP 120/70 | HR 66 | Temp 98.4°F | Resp 14 | Ht 67.0 in | Wt 142.2 lb

## 2013-05-09 DIAGNOSIS — C50412 Malignant neoplasm of upper-outer quadrant of left female breast: Secondary | ICD-10-CM

## 2013-05-09 DIAGNOSIS — C50419 Malignant neoplasm of upper-outer quadrant of unspecified female breast: Secondary | ICD-10-CM

## 2013-05-09 LAB — CBC WITH DIFFERENTIAL/PLATELET
BASO%: 0.9 % (ref 0.0–2.0)
Basophils Absolute: 0.1 10*3/uL (ref 0.0–0.1)
EOS%: 0.2 % (ref 0.0–7.0)
Eosinophils Absolute: 0 10*3/uL (ref 0.0–0.5)
HCT: 31 % — ABNORMAL LOW (ref 34.8–46.6)
HGB: 10.1 g/dL — ABNORMAL LOW (ref 11.6–15.9)
MCH: 27.5 pg (ref 25.1–34.0)
MCHC: 32.6 g/dL (ref 31.5–36.0)
MONO#: 1.7 10*3/uL — ABNORMAL HIGH (ref 0.1–0.9)
MONO%: 14.9 % — ABNORMAL HIGH (ref 0.0–14.0)
NEUT#: 8.5 10*3/uL — ABNORMAL HIGH (ref 1.5–6.5)
NEUT%: 74.9 % (ref 38.4–76.8)
Platelets: 253 10*3/uL (ref 145–400)
WBC: 11.4 10*3/uL — ABNORMAL HIGH (ref 3.9–10.3)
lymph#: 1 10*3/uL (ref 0.9–3.3)

## 2013-05-09 NOTE — Assessment & Plan Note (Signed)
Pt has had complete radiologic response to chemotherapy.   In our previous notes, it was thought that she would likely need mastectomy if the third area seen on MRI was malignant.  However, the patient greatly desires breast conservation if she is able.   I will speak to radiology about whether or not she would be a reasonable candidate for the multiple ipsilateral breast cancer study.   She is small breasted, and I counseled her that she is likely to find the cosmetic outcome for breast conservation unpalatable.    She is not interested in seeing a Engineer, petroleum at this time.  She is going to finish out the next 12 weeks of chemo at this time.  I will see her mid January and we will get surgery scheduled.  If she is a candidate for the MIBC study, we will get her to see the study personnel between now and then.

## 2013-05-09 NOTE — Patient Instructions (Signed)
We will find out from radiology whether we need to do mastectomy or if we can do 2 lumpectomies.    I will see you back in around 2 months.

## 2013-05-09 NOTE — Progress Notes (Signed)
HISTORY: Pt is a 52 yo F who was diagnosed with breast cancer in august this year.  She has triple negative disease, and she has been getting neoadjuvant chemotherapy.  She has had a main lesion, a satellite lesion, and a third area lateral in the left breast.  She recently underwent repeat MRI, and has demonstrated good clinical and radiographic response.  She can no longer feel the mass.  She has been tolerating chemotherapy well.    PERTINENT REVIEW OF SYSTEMS: Otherwise negative x 11.    Filed Vitals:   05/09/13 1432  BP: 120/70  Pulse: 66  Temp: 98.4 F (36.9 C)  Resp: 14   Filed Weights   05/09/13 1432  Weight: 142 lb 3.2 oz (64.501 kg)     EXAM: Head: Normocephalic and atraumatic.  Eyes:  Conjunctivae are normal. Pupils are equal, round, and reactive to light. No scleral icterus.  Neck:  Normal range of motion. Neck supple. No tracheal deviation present. No thyromegaly present.  Resp: No respiratory distress, normal effort. Breast:  Far left lateral breast mass is no longer palpable.  There is a suggestion of some thickening, but no discrete area.  There are no other palpable masses.  No lymphadenopathy is palpable on either side.   Abd:  Abdomen is soft, non distended and non tender. No masses are palpable.  There is no rebound and no guarding.  Neurological: Alert and oriented to person, place, and time. Coordination normal.  Skin: Skin is warm and dry. No rash noted. No diaphoretic. No erythema. No pallor.  Psychiatric: Normal mood and affect. Normal behavior. Judgment and thought content normal.      ASSESSMENT AND PLAN:   Breast cancer of upper-outer quadrant of left female breast Pt has had complete radiologic response to chemotherapy.   In our previous notes, it was thought that she would likely need mastectomy if the third area seen on MRI was malignant.  However, the patient greatly desires breast conservation if she is able.   I will speak to radiology about  whether or not she would be a reasonable candidate for the multiple ipsilateral breast cancer study.   She is small breasted, and I counseled her that she is likely to find the cosmetic outcome for breast conservation unpalatable.    She is not interested in seeing a Engineer, petroleum at this time.  She is going to finish out the next 12 weeks of chemo at this time.  I will see her mid January and we will get surgery scheduled.  If she is a candidate for the MIBC study, we will get her to see the study personnel between now and then.        Maudry Diego, MD Surgical Oncology, General & Endocrine Surgery [facility]  [address] [place], [place] Catalina Gravel, PA-C

## 2013-05-10 ENCOUNTER — Ambulatory Visit: Payer: BC Managed Care – PPO

## 2013-05-10 ENCOUNTER — Other Ambulatory Visit: Payer: BC Managed Care – PPO | Admitting: Lab

## 2013-05-10 ENCOUNTER — Ambulatory Visit: Payer: BC Managed Care – PPO | Admitting: Physician Assistant

## 2013-05-11 ENCOUNTER — Telehealth (INDEPENDENT_AMBULATORY_CARE_PROVIDER_SITE_OTHER): Payer: Self-pay | Admitting: General Surgery

## 2013-05-11 NOTE — Telephone Encounter (Signed)
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   She is breast conservation candidate.   We would be able to proceed once she is done with chemotherapy with NL partial mastectomy and SLN bx.

## 2013-05-17 ENCOUNTER — Encounter: Payer: Self-pay | Admitting: *Deleted

## 2013-05-17 ENCOUNTER — Ambulatory Visit (HOSPITAL_BASED_OUTPATIENT_CLINIC_OR_DEPARTMENT_OTHER): Payer: BC Managed Care – PPO

## 2013-05-17 ENCOUNTER — Ambulatory Visit (HOSPITAL_BASED_OUTPATIENT_CLINIC_OR_DEPARTMENT_OTHER): Payer: BC Managed Care – PPO | Admitting: Oncology

## 2013-05-17 ENCOUNTER — Other Ambulatory Visit (HOSPITAL_BASED_OUTPATIENT_CLINIC_OR_DEPARTMENT_OTHER): Payer: BC Managed Care – PPO | Admitting: Lab

## 2013-05-17 VITALS — BP 122/77 | HR 89 | Temp 98.0°F | Resp 18

## 2013-05-17 VITALS — BP 132/87 | HR 106 | Temp 98.7°F | Resp 20 | Ht 67.0 in | Wt 139.0 lb

## 2013-05-17 DIAGNOSIS — C50412 Malignant neoplasm of upper-outer quadrant of left female breast: Secondary | ICD-10-CM

## 2013-05-17 DIAGNOSIS — Z5111 Encounter for antineoplastic chemotherapy: Secondary | ICD-10-CM

## 2013-05-17 DIAGNOSIS — C50419 Malignant neoplasm of upper-outer quadrant of unspecified female breast: Secondary | ICD-10-CM

## 2013-05-17 DIAGNOSIS — Z171 Estrogen receptor negative status [ER-]: Secondary | ICD-10-CM

## 2013-05-17 LAB — COMPREHENSIVE METABOLIC PANEL (CC13)
ALT: 14 U/L (ref 0–55)
AST: 17 U/L (ref 5–34)
Albumin: 3.8 g/dL (ref 3.5–5.0)
Calcium: 10 mg/dL (ref 8.4–10.4)
Chloride: 105 mEq/L (ref 98–109)
Glucose: 83 mg/dl (ref 70–140)
Potassium: 4 mEq/L (ref 3.5–5.1)
Sodium: 140 mEq/L (ref 136–145)
Total Bilirubin: 0.28 mg/dL (ref 0.20–1.20)
Total Protein: 6.9 g/dL (ref 6.4–8.3)

## 2013-05-17 LAB — CBC WITH DIFFERENTIAL/PLATELET
BASO%: 2.7 % — ABNORMAL HIGH (ref 0.0–2.0)
Basophils Absolute: 0.2 10*3/uL — ABNORMAL HIGH (ref 0.0–0.1)
HCT: 36 % (ref 34.8–46.6)
MCHC: 32.5 g/dL (ref 31.5–36.0)
MONO#: 0.6 10*3/uL (ref 0.1–0.9)
MONO%: 10.5 % (ref 0.0–14.0)
NEUT#: 3.8 10*3/uL (ref 1.5–6.5)
NEUT%: 63.3 % (ref 38.4–76.8)
Platelets: 419 10*3/uL — ABNORMAL HIGH (ref 145–400)
RDW: 16.1 % — ABNORMAL HIGH (ref 11.2–14.5)
WBC: 6 10*3/uL (ref 3.9–10.3)
lymph#: 1.3 10*3/uL (ref 0.9–3.3)
nRBC: 0 % (ref 0–0)

## 2013-05-17 MED ORDER — SODIUM CHLORIDE 0.9 % IV SOLN
Freq: Once | INTRAVENOUS | Status: AC
  Administered 2013-05-17: 15:00:00 via INTRAVENOUS

## 2013-05-17 MED ORDER — PACLITAXEL CHEMO INJECTION 300 MG/50ML
80.0000 mg/m2 | Freq: Once | INTRAVENOUS | Status: AC
  Administered 2013-05-17: 138 mg via INTRAVENOUS
  Filled 2013-05-17: qty 23

## 2013-05-17 MED ORDER — DEXAMETHASONE SODIUM PHOSPHATE 20 MG/5ML IJ SOLN
20.0000 mg | Freq: Once | INTRAMUSCULAR | Status: AC
  Administered 2013-05-17: 20 mg via INTRAVENOUS

## 2013-05-17 MED ORDER — FAMOTIDINE IN NACL 20-0.9 MG/50ML-% IV SOLN
INTRAVENOUS | Status: AC
  Filled 2013-05-17: qty 50

## 2013-05-17 MED ORDER — ONDANSETRON 16 MG/50ML IVPB (CHCC)
16.0000 mg | Freq: Once | INTRAVENOUS | Status: AC
  Administered 2013-05-17: 16 mg via INTRAVENOUS

## 2013-05-17 MED ORDER — SODIUM CHLORIDE 0.9 % IJ SOLN
10.0000 mL | INTRAMUSCULAR | Status: DC | PRN
  Administered 2013-05-17: 10 mL
  Filled 2013-05-17: qty 10

## 2013-05-17 MED ORDER — ONDANSETRON 16 MG/50ML IVPB (CHCC)
INTRAVENOUS | Status: AC
  Filled 2013-05-17: qty 16

## 2013-05-17 MED ORDER — DIPHENHYDRAMINE HCL 50 MG/ML IJ SOLN
INTRAMUSCULAR | Status: AC
  Filled 2013-05-17: qty 1

## 2013-05-17 MED ORDER — FAMOTIDINE IN NACL 20-0.9 MG/50ML-% IV SOLN
20.0000 mg | Freq: Once | INTRAVENOUS | Status: AC
  Administered 2013-05-17: 20 mg via INTRAVENOUS

## 2013-05-17 MED ORDER — DIPHENHYDRAMINE HCL 50 MG/ML IJ SOLN
50.0000 mg | Freq: Once | INTRAMUSCULAR | Status: AC
  Administered 2013-05-17: 50 mg via INTRAVENOUS

## 2013-05-17 MED ORDER — HEPARIN SOD (PORK) LOCK FLUSH 100 UNIT/ML IV SOLN
500.0000 [IU] | Freq: Once | INTRAVENOUS | Status: AC | PRN
  Administered 2013-05-17: 500 [IU]
  Filled 2013-05-17: qty 5

## 2013-05-17 MED ORDER — SODIUM CHLORIDE 0.9 % IV SOLN
250.6000 mg | Freq: Once | INTRAVENOUS | Status: AC
  Administered 2013-05-17: 250 mg via INTRAVENOUS
  Filled 2013-05-17: qty 25

## 2013-05-17 MED ORDER — DEXAMETHASONE SODIUM PHOSPHATE 20 MG/5ML IJ SOLN
INTRAMUSCULAR | Status: AC
  Filled 2013-05-17: qty 5

## 2013-05-17 NOTE — Progress Notes (Signed)
ID: [name] OB: 05/13/1961  MR#: [id number]  CSN#:[id number]  PCP: Ana Ka, MD GYN:   SU: Ana Young OTHER MD: Ana Young, Ana Young  CHIEF COMPLAINT:  Left  Breast Cancer  HISTORY OF PRESENT ILLNESS: Ana Young herself palpated a mass in her left breast 01/15/2013 and brought it to the attention of Dr. Mauricio Young, who arranged for her to have bilateral digital mammography at the [facility] Center 01/27/2013. The patient does have heterogeneously dense breasts. In the outer upper left breast there was an oval mass by mammography measuring approximately 1.5 cm, which by palpation was firm and measured approximately 1 cm. Ultrasound showed this to be hypoechoic and to measure 1.4 cm.  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  The patient then underwent bilateral breast MRI 02/28/2013. In the left breast upper-outer quadrant the mass in question measured 2.9 cm. There was a second mass lateral to it measuring 1.0 cm. There were no additional abnormal appearing lymph nodes and no evidence of internal mammary adenopathy.  In the right breast there were 2 areas of irregular enhancement in the superior central third of the breast, both measuring 9 mm. These were felt to be worrisome for a ductal carcinoma in situ.  The patient's case was discussed at the multidisciplinary breast cancer conference 03/02/2013. Her subsequent history is as detailed below.  INTERVAL HISTORY: Ana Young returns today accompanied by her husband Ana Young for followup of her locally advanced left breast carcinoma. Today is day 1 cycle 1 of her carboplatin and paclitaxel chemotherapy. She completed her fourth cycle of doxorubicin and cyclophosphamide 3 weeks ago.  REVIEW OF SYSTEMS: Ana Young did "well" with her initial chemotherapy she was fatigued. She had no significant  nausea or vomiting, fever, or bleeding. Her port function fine. Taste is okay but her appetite is down. She was having periods very irregularly before chemotherapy started and not at all since it began. Otherwise a detailed review of systems today was noncontributory   PAST MEDICAL HISTORY: Past Medical History  Diagnosis Date  . Depression   . Vitamin D deficiency   . Anxiety   . Seasonal allergies   . HTN (hypertension)   . Insomnia   . History of migraines   . Cancer     Left breast cancer    PAST SURGICAL HISTORY: Past Surgical History  Procedure Laterality Date  . Bunionectomy Left 2009  . Portacath placement Left 03/09/2013    Procedure: INSERTION PORT-A-CATH;  Surgeon: Ana Lint, MD;  [facility];  Service: General;  Laterality: Left;    FAMILY HISTORY Family History  Problem Relation Age of Onset  . Heart attack Brother   . Congestive Heart Failure Mother   . Stroke Father   . Diabetes Father   . Breast cancer Maternal Grandmother 17   the patient's parents are in their 25s. The patient has one brother, no sisters. The patient's maternal grandmother was diagnosed with breast cancer the age of 72. There is no other history of breast cancer in the family to the patient's knowledge, and no history of ovarian cancer  GYNECOLOGIC HISTORY:  Menarche age 88, first live birth age 53, the patient is GX P2. Her periods are not irregular, but still every 2-3 months, and otherwise unremarkable.  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 Her husband Ana Young works as a Actuary. Son Ana Young lives in [place] and works in Engineering geologist. Daughter Ana Young is 50 year old and at home. The patient has no grandchildren. She attends the [employer]    ADVANCED DIRECTIVES: Not in place  HEALTH MAINTENANCE:   (Updated 04/26/2013) History  Substance Use Topics  . Smoking status: Never Smoker   . Smokeless tobacco: Never Used  . Alcohol Use: No     Colonoscopy: - Never  PAP: Feb 2012, Dr. Jeanice Young  Bone density: - Never  Lipid panel:  Dec 2012, Dr. Ashley Young   No Known Allergies  Current Outpatient Prescriptions  Medication Sig Dispense Refill  . ALPRAZolam (XANAX) 1 MG tablet Take 1 mg by mouth 2 (two) times daily as needed for sleep (1/2 tablet bid as needed).      Marland Kitchen buPROPion (WELLBUTRIN XL) 300 MG 24 hr tablet TAKE 1 TABLET (300 MG TOTAL) BY MOUTH EVERY MORNING.  30 tablet  3  . dexamethasone (DECADRON) 4 MG tablet       . docusate sodium (COLACE) 100 MG capsule Take 1-2 capsules (100-200 mg total) by mouth every morning. As directed for constipation  30 capsule  1  . ibuprofen (ADVIL,MOTRIN) 800 MG tablet TAKE 1 TABLET (800 MG TOTAL) BY MOUTH EVERY 8 (EIGHT) HOURS AS NEEDED FOR PAIN.  30 tablet  2  . lidocaine-prilocaine (EMLA) cream Apply topically as needed. For chemotherapy  30 g  0  . LORazepam (ATIVAN) 0.5 MG tablet Take 1 tablet (0.5 mg total) by mouth at bedtime as needed for anxiety (Nausea or vomiting).  30 tablet  0  . ondansetron (ZOFRAN) 8 MG tablet Take 1 tablet (8 mg total) by mouth 2 (two) times daily. 1 tab by mouth twice daily x 2 days after chemo, then 1 tab every 12 hrs PRN nausea  30 tablet  1  . polyethylene glycol powder (MIRALAX) powder Take 17 g by mouth daily as needed (constipation).  255 g  1  . prochlorperazine (COMPAZINE) 10 MG tablet       . propranolol ER (INDERAL LA) 120 MG 24 hr capsule TAKE 1 CAPSULE (120 MG TOTAL) BY MOUTH DAILY.  93 capsule  1  . zolpidem (AMBIEN) 5 MG tablet TAKE 1 TABLET BY MOUTH AT BEDTIME AS NEEDED FOR SLEEP  30 tablet  0   No current facility-administered medications for this visit.    OBJECTIVE: Middle-aged [place] woman who appears stated age 52 Vitals:   05/17/13 1334  BP: 132/87  Pulse: 106  Temp: 98.7 F  (37.1 C)  Resp: 20     Body mass index is 21.77 kg/(m^2).    ECOG FS: 1 Filed Weights   05/17/13 1334  Weight: 139 lb (63.05 kg)   Physical Exam: Sclerae unicteric, pupils equal and reactive to light Oropharynx no thrush or other lesions No cervical or supraclavicular adenopathy Lungs no rales or rhonchi Heart regular rate and rhythm Abd soft, nontender, positive bowel sounds n MSK no focal spinal tenderness, no peripheral edema Neuro: nonfocal Breasts: No masses palpated in either breast. Both axillae are benign. No skin or nipple changes of concern.    LAB RESULTS:  Lab Results  Component Value Date   WBC 6.0 05/17/2013   NEUTROABS 3.8 05/17/2013   HGB 11.7 05/17/2013   HCT 36.0 05/17/2013   MCV 85.5 05/17/2013   PLT 419* 05/17/2013  Chemistry      Component Value Date/Time   NA 142 05/03/2013 1250   NA 138 06/16/2011 0938   K 4.1 05/03/2013 1250   K 4.1 06/16/2011 0938   CL 110 06/16/2011 0938   CO2 25 05/03/2013 1250   CO2 26 06/16/2011 0938   BUN 7.0 05/03/2013 1250   BUN 10 06/16/2011 0938   CREATININE 0.7 05/03/2013 1250   CREATININE 0.81 06/16/2011 0938   CREATININE 0.74 07/26/2010 2332      Component Value Date/Time   CALCIUM 9.3 05/03/2013 1250   CALCIUM 9.5 06/16/2011 0938   ALKPHOS 118 05/03/2013 1250   ALKPHOS 78 06/16/2011 0938   AST 10 05/03/2013 1250   AST 13 06/16/2011 0938   ALT 11 05/03/2013 1250   ALT 10 06/16/2011 0938   BILITOT 0.36 05/03/2013 1250   BILITOT 0.5 06/16/2011 0938      STUDIES:  Echocardiogram on 03/08/2013 showed an ejection fraction of 55-60%.   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 Patient is undergoing neoadjuvant chemotherapy. Assess response to neoadjuvant therapy.  EXAM: MR BILATERAL BREAST WITHOUT AND WITH CONTRAST  TECHNIQUE: Multiplanar,  multisequence MR images of both breasts were obtained prior to and following the intravenous administration of 13ml of MultiHance.  THREE-DIMENSIONAL MR IMAGE RENDERING ON INDEPENDENT WORKSTATION:  Three-dimensional MR images were rendered by post-processing of the original MR data on an independent workstation. The three-dimensional MR images were interpreted, and findings are reported in the following complete MRI report for this study.  COMPARISON:  Previous exams  FINDINGS: Breast composition: c:  Heterogeneous fibroglandular tissue  Background parenchymal enhancement: Mild to moderate.  Right breast: The previously described areas of clumped linear enhancement located within the central right breast which were shown to represent fibrocystic change on MR guided biopsies appear stable. There are no new enhancing foci within the right breast.  Left breast: The previously seen adjacent enhancing masses located within the superior left breast are no longer visible by MR and there is no abnormal enhancement in this region. There are no new enhancing foci.  Lymph nodes: No abnormal appearing lymph nodes.  Ancillary findings:  None.  IMPRESSION: Excellent response to neoadjuvant chemotherapy with no residual enhancing masses visible within the superior left breast. The study is otherwise unchanged.  RECOMMENDATION: Treatment plan.  BI-RADS CATEGORY  6: Known biopsy-proven malignancy - appropriate action should be taken.   Electronically Signed   By: Ana Young M.D.   On: 05/02/2013 15:46     ASSESSMENT: 52 y.o. Ana Young woman   (1)  status post left breast upper outer quadrant biopsy 02/22/2013 for a clinical T2 N0, stage IIA invasive ductal carcinoma, grade 3, triple negative, with an MIB-1 of 100%.    (2) MRI on 03/02/2013 showed an additional nodule at the 2:00 position, 5 mm lateral to the primary in the left breast, measuring 10 x 6 x 9 mm. This was biopsied on 03/11/2013 and was consistent with  high-grade carcinoma [phone]). Prognostic panel is pending.   (3) MRI on 03/02/2013 also showed 2 areas of irregularity in the right breast which were worrisome for possible DCIS. Both of these areas were biopsied on 03/04/2013, and both showed fibrocystic changes with no evidence of malignancy ([id number]).   (4) genetics testing is pending  (5)  treated in the neoadjuvant setting with   (a)  4 dose dense cycles of doxorubicin/ cyclophosphamide, with the first dose given on 03/15/2013 and the final one 04/26/2013, with a complete radiologic response  (b) followed by 12 weekly doses of carboplatin/paclitaxel started 05/17/2013, given in standard dose, with all neoadjuvant chemotherapy to be completed prior to definitive surgery.    PLAN: Shereda has had a very good response to her initial chemotherapy. Assuming we can get through the second-half without event, this is very suggestive of a complete pathologic response at the end.  Today we reviewed side effects, toxicities and complications of carboplatin and paclitaxel with special emphasis on neuropathy. Of course we will be asking her before each treatment if she develops neuropathy, and if she does 70 symptoms we will await a week as needed to see if it clears. Otherwise she will use prochlorperazine and ondansetron for nausea control as discussed last week and reinforce today.  Her appetite is down a bit. She is a little bit of heartburn. We're going to wait a couple of weeks before intervening but otherwise we may start with omeprazole. Incidentally her disability has not yet been approved. She still has a good 6 months or so of treatment last (chemotherapy post surgery plus radiation). Ana Dell, MD   05/17/2013 1:45 PM

## 2013-05-17 NOTE — Patient Instructions (Addendum)
[facility] Discharge Instructions for Patients Receiving Chemotherapy  Today you received the following chemotherapy agents Taxol/Carboplatin  To help prevent nausea and vomiting after your treatment, we encourage you to take your nausea medication as per Dr. Darnelle Catalan. (zofran 8mg  orally twice daily for 2 days after chemo then you may use this as needed for nausea). You may also use compazine 10mg  tab every 6-8 hrs as needed for nausea after chemotherapy). Please increase oral fluids.   If you develop nausea and vomiting that is not controlled by your nausea medication, call the clinic.   A nurse from [facility] will call you tomorrow for follow up. Please do not wait for this call if you have to contact Dr. Darnelle Catalan for urgent issues.  BELOW ARE SYMPTOMS THAT SHOULD BE REPORTED IMMEDIATELY:  *FEVER GREATER THAN 100.5 F  *CHILLS WITH OR WITHOUT FEVER  NAUSEA AND VOMITING THAT IS NOT CONTROLLED WITH YOUR NAUSEA MEDICATION  *UNUSUAL SHORTNESS OF BREATH  *UNUSUAL BRUISING OR BLEEDING  TENDERNESS IN MOUTH AND THROAT WITH OR WITHOUT PRESENCE OF ULCERS  *URINARY PROBLEMS  *BOWEL PROBLEMS  UNUSUAL RASH Items with * indicate a potential emergency and should be followed up as soon as possible.  Feel free to call the clinic should you have any questions or concerns. The clinic phone number is [phone]. Paclitaxel injection What is this medicine? PACLITAXEL (PAK li TAX el) is a chemotherapy drug. It targets fast dividing cells, like cancer cells, and causes these cells to die. This medicine is used to treat ovarian cancer, breast cancer, and other cancers. This medicine may be used for other purposes; ask your health care provider or pharmacist if you have questions. COMMON BRAND NAME(S): Onxol , Taxol What should I tell my health care provider before I take this medicine? They need to know if you have any of these conditions: -blood disorders -irregular  heartbeat -infection (especially a virus infection such as chickenpox, cold sores, or herpes) -liver disease -previous or ongoing radiation therapy -an unusual or allergic reaction to paclitaxel, alcohol, polyoxyethylated castor oil, other chemotherapy agents, other medicines, foods, dyes, or preservatives -pregnant or trying to get pregnant -breast-feeding How should I use this medicine? This drug is given as an infusion into a vein. It is administered in a hospital or clinic by a specially trained health care professional. Talk to your pediatrician regarding the use of this medicine in children. Special care may be needed. Overdosage: If you think you have taken too much of this medicine contact a poison control center or emergency room at once. NOTE: This medicine is only for you. Do not share this medicine with others. What if I miss a dose? It is important not to miss your dose. Call your doctor or health care professional if you are unable to keep an appointment. What may interact with this medicine? Do not take this medicine with any of the following medications: -disulfiram -metronidazole This medicine may also interact with the following medications: -cyclosporine -diazepam -ketoconazole -medicines to increase blood counts like filgrastim, pegfilgrastim, sargramostim -other chemotherapy drugs like cisplatin, doxorubicin, epirubicin, etoposide, teniposide, vincristine -quinidine -testosterone -vaccines -verapamil Talk to your doctor or health care professional before taking any of these medicines: -acetaminophen -aspirin -ibuprofen -ketoprofen -naproxen This list may not describe all possible interactions. Give your health care provider a list of all the medicines, herbs, non-prescription drugs, or dietary supplements you use. Also tell them if you smoke, drink alcohol, or use illegal drugs. Some items may  interact with your medicine. What should I watch for while using  this medicine? Your condition will be monitored carefully while you are receiving this medicine. You will need important blood work done while you are taking this medicine. This drug may make you feel generally unwell. This is not uncommon, as chemotherapy can affect healthy cells as well as cancer cells. Report any side effects. Continue your course of treatment even though you feel ill unless your doctor tells you to stop. In some cases, you may be given additional medicines to help with side effects. Follow all directions for their use. Call your doctor or health care professional for advice if you get a fever, chills or sore throat, or other symptoms of a cold or flu. Do not treat yourself. This drug decreases your body's ability to fight infections. Try to avoid being around people who are sick. This medicine may increase your risk to bruise or bleed. Call your doctor or health care professional if you notice any unusual bleeding. Be careful brushing and flossing your teeth or using a toothpick because you may get an infection or bleed more easily. If you have any dental work done, tell your dentist you are receiving this medicine. Avoid taking products that contain aspirin, acetaminophen, ibuprofen, naproxen, or ketoprofen unless instructed by your doctor. These medicines may hide a fever. Do not become pregnant while taking this medicine. Women should inform their doctor if they wish to become pregnant or think they might be pregnant. There is a potential for serious side effects to an unborn child. Talk to your health care professional or pharmacist for more information. Do not breast-feed an infant while taking this medicine. Men are advised not to father a child while receiving this medicine. What side effects may I notice from receiving this medicine? Side effects that you should report to your doctor or health care professional as soon as possible: -allergic reactions like skin rash, itching  or hives, swelling of the face, lips, or tongue -low blood counts - This drug may decrease the number of white blood cells, red blood cells and platelets. You may be at increased risk for infections and bleeding. -signs of infection - fever or chills, cough, sore throat, pain or difficulty passing urine -signs of decreased platelets or bleeding - bruising, pinpoint red spots on the skin, black, tarry stools, nosebleeds -signs of decreased red blood cells - unusually weak or tired, fainting spells, lightheadedness -breathing problems -chest pain -high or low blood pressure -mouth sores -nausea and vomiting -pain, swelling, redness or irritation at the injection site -pain, tingling, numbness in the hands or feet -slow or irregular heartbeat -swelling of the ankle, feet, hands Side effects that usually do not require medical attention (report to your doctor or health care professional if they continue or are bothersome): -bone pain -complete hair loss including hair on your head, underarms, pubic hair, eyebrows, and eyelashes -changes in the color of fingernails -diarrhea -loosening of the fingernails -loss of appetite -muscle or joint pain -red flush to skin -sweating This list may not describe all possible side effects. Call your doctor for medical advice about side effects. You may report side effects to FDA at 1-800-FDA-1088. Where should I keep my medicine? This drug is given in a hospital or clinic and will not be stored at home. NOTE: This sheet is a summary. It may not cover all possible information. If you have questions about this medicine, talk to your doctor, pharmacist, or health care  provider.  2014, [employer]. (2012-08-16 16:41:21)   Carboplatin injection What is this medicine? CARBOPLATIN (KAR boe pla tin) is a chemotherapy drug. It targets fast dividing cells, like cancer cells, and causes these cells to die. This medicine is used to treat ovarian cancer  and many other cancers. This medicine may be used for other purposes; ask your health care provider or pharmacist if you have questions. COMMON BRAND NAME(S): Paraplatin What should I tell my health care provider before I take this medicine? They need to know if you have any of these conditions: -blood disorders -hearing problems -kidney disease -recent or ongoing radiation therapy -an unusual or allergic reaction to carboplatin, cisplatin, other chemotherapy, other medicines, foods, dyes, or preservatives -pregnant or trying to get pregnant -breast-feeding How should I use this medicine? This drug is usually given as an infusion into a vein. It is administered in a hospital or clinic by a specially trained health care professional. Talk to your pediatrician regarding the use of this medicine in children. Special care may be needed. Overdosage: If you think you have taken too much of this medicine contact a poison control center or emergency room at once. NOTE: This medicine is only for you. Do not share this medicine with others. What if I miss a dose? It is important not to miss a dose. Call your doctor or health care professional if you are unable to keep an appointment. What may interact with this medicine? -medicines for seizures -medicines to increase blood counts like filgrastim, pegfilgrastim, sargramostim -some antibiotics like amikacin, gentamicin, neomycin, streptomycin, tobramycin -vaccines Talk to your doctor or health care professional before taking any of these medicines: -acetaminophen -aspirin -ibuprofen -ketoprofen -naproxen This list may not describe all possible interactions. Give your health care provider a list of all the medicines, herbs, non-prescription drugs, or dietary supplements you use. Also tell them if you smoke, drink alcohol, or use illegal drugs. Some items may interact with your medicine. What should I watch for while using this medicine? Your  condition will be monitored carefully while you are receiving this medicine. You will need important blood work done while you are taking this medicine. This drug may make you feel generally unwell. This is not uncommon, as chemotherapy can affect healthy cells as well as cancer cells. Report any side effects. Continue your course of treatment even though you feel ill unless your doctor tells you to stop. In some cases, you may be given additional medicines to help with side effects. Follow all directions for their use. Call your doctor or health care professional for advice if you get a fever, chills or sore throat, or other symptoms of a cold or flu. Do not treat yourself. This drug decreases your body's ability to fight infections. Try to avoid being around people who are sick. This medicine may increase your risk to bruise or bleed. Call your doctor or health care professional if you notice any unusual bleeding. Be careful brushing and flossing your teeth or using a toothpick because you may get an infection or bleed more easily. If you have any dental work done, tell your dentist you are receiving this medicine. Avoid taking products that contain aspirin, acetaminophen, ibuprofen, naproxen, or ketoprofen unless instructed by your doctor. These medicines may hide a fever. Do not become pregnant while taking this medicine. Women should inform their doctor if they wish to become pregnant or think they might be pregnant. There is a potential for serious side effects to  an unborn child. Talk to your health care professional or pharmacist for more information. Do not breast-feed an infant while taking this medicine. What side effects may I notice from receiving this medicine? Side effects that you should report to your doctor or health care professional as soon as possible: -allergic reactions like skin rash, itching or hives, swelling of the face, lips, or tongue -signs of infection - fever or chills,  cough, sore throat, pain or difficulty passing urine -signs of decreased platelets or bleeding - bruising, pinpoint red spots on the skin, black, tarry stools, nosebleeds -signs of decreased red blood cells - unusually weak or tired, fainting spells, lightheadedness -breathing problems -changes in hearing -changes in vision -chest pain -high blood pressure -low blood counts - This drug may decrease the number of white blood cells, red blood cells and platelets. You may be at increased risk for infections and bleeding. -nausea and vomiting -pain, swelling, redness or irritation at the injection site -pain, tingling, numbness in the hands or feet -problems with balance, talking, walking -trouble passing urine or change in the amount of urine Side effects that usually do not require medical attention (report to your doctor or health care professional if they continue or are bothersome): -hair loss -loss of appetite -metallic taste in the mouth or changes in taste This list may not describe all possible side effects. Call your doctor for medical advice about side effects. You may report side effects to FDA at 1-800-FDA-1088. Where should I keep my medicine? This drug is given in a hospital or clinic and will not be stored at home. NOTE: This sheet is a summary. It may not cover all possible information. If you have questions about this medicine, talk to your doctor, pharmacist, or health care provider.  2014, [employer]. (2007-09-28 14:38:05)

## 2013-05-18 ENCOUNTER — Telehealth: Payer: Self-pay | Admitting: *Deleted

## 2013-05-18 NOTE — Telephone Encounter (Signed)
Spoke with pt today for post chemo follow up call.  Pt stated she was doing fine.  Denied nausea/vomiting,  Is taking Zofran as instructed post chemo.  Stated appetite fair, and drinking lots of fluids as tolerated including Boost.  Pt has chronic constipation and knows how to take stool softener.  Bladder functions fine per pt.  Gave instructions on how to take Imodium should pt experiences diarrhea.  Pt voiced understanding.  Pt also aware of return appt on 05/24/13.

## 2013-05-20 ENCOUNTER — Ambulatory Visit (INDEPENDENT_AMBULATORY_CARE_PROVIDER_SITE_OTHER): Payer: BC Managed Care – PPO | Admitting: General Surgery

## 2013-05-23 ENCOUNTER — Encounter: Payer: Self-pay | Admitting: *Deleted

## 2013-05-23 NOTE — Progress Notes (Signed)
Mailed after appt letter to pt. 

## 2013-05-24 ENCOUNTER — Ambulatory Visit (HOSPITAL_BASED_OUTPATIENT_CLINIC_OR_DEPARTMENT_OTHER): Payer: BC Managed Care – PPO

## 2013-05-24 ENCOUNTER — Ambulatory Visit (HOSPITAL_BASED_OUTPATIENT_CLINIC_OR_DEPARTMENT_OTHER): Payer: BC Managed Care – PPO | Admitting: Oncology

## 2013-05-24 ENCOUNTER — Other Ambulatory Visit (HOSPITAL_BASED_OUTPATIENT_CLINIC_OR_DEPARTMENT_OTHER): Payer: BC Managed Care – PPO

## 2013-05-24 VITALS — BP 148/87 | HR 106 | Temp 98.4°F | Resp 18 | Ht 67.0 in | Wt 142.8 lb

## 2013-05-24 DIAGNOSIS — C50419 Malignant neoplasm of upper-outer quadrant of unspecified female breast: Secondary | ICD-10-CM

## 2013-05-24 DIAGNOSIS — C50412 Malignant neoplasm of upper-outer quadrant of left female breast: Secondary | ICD-10-CM

## 2013-05-24 DIAGNOSIS — Z5111 Encounter for antineoplastic chemotherapy: Secondary | ICD-10-CM

## 2013-05-24 DIAGNOSIS — Z171 Estrogen receptor negative status [ER-]: Secondary | ICD-10-CM

## 2013-05-24 LAB — COMPREHENSIVE METABOLIC PANEL (CC13)
ALT: 28 U/L (ref 0–55)
AST: 23 U/L (ref 5–34)
Albumin: 3.6 g/dL (ref 3.5–5.0)
Alkaline Phosphatase: 76 U/L (ref 40–150)
Potassium: 3.9 mEq/L (ref 3.5–5.1)
Sodium: 139 mEq/L (ref 136–145)
Total Bilirubin: 0.21 mg/dL (ref 0.20–1.20)
Total Protein: 6.6 g/dL (ref 6.4–8.3)

## 2013-05-24 LAB — CBC WITH DIFFERENTIAL/PLATELET
BASO%: 3.9 % — ABNORMAL HIGH (ref 0.0–2.0)
EOS%: 9 % — ABNORMAL HIGH (ref 0.0–7.0)
MCH: 27.7 pg (ref 25.1–34.0)
MCHC: 32.9 g/dL (ref 31.5–36.0)
MCV: 84.2 fL (ref 79.5–101.0)
MONO%: 8.4 % (ref 0.0–14.0)
RBC: 3.79 10*6/uL (ref 3.70–5.45)
RDW: 15.8 % — ABNORMAL HIGH (ref 11.2–14.5)
nRBC: 0 % (ref 0–0)

## 2013-05-24 MED ORDER — PACLITAXEL CHEMO INJECTION 300 MG/50ML
80.0000 mg/m2 | Freq: Once | INTRAVENOUS | Status: AC
  Administered 2013-05-24: 138 mg via INTRAVENOUS
  Filled 2013-05-24: qty 23

## 2013-05-24 MED ORDER — DEXAMETHASONE SODIUM PHOSPHATE 20 MG/5ML IJ SOLN
INTRAMUSCULAR | Status: AC
  Filled 2013-05-24: qty 5

## 2013-05-24 MED ORDER — DEXAMETHASONE SODIUM PHOSPHATE 20 MG/5ML IJ SOLN
20.0000 mg | Freq: Once | INTRAMUSCULAR | Status: AC
  Administered 2013-05-24: 20 mg via INTRAVENOUS

## 2013-05-24 MED ORDER — FAMOTIDINE IN NACL 20-0.9 MG/50ML-% IV SOLN
20.0000 mg | Freq: Once | INTRAVENOUS | Status: AC
  Administered 2013-05-24: 20 mg via INTRAVENOUS

## 2013-05-24 MED ORDER — DIPHENHYDRAMINE HCL 50 MG/ML IJ SOLN
50.0000 mg | Freq: Once | INTRAMUSCULAR | Status: AC
  Administered 2013-05-24: 50 mg via INTRAVENOUS

## 2013-05-24 MED ORDER — SODIUM CHLORIDE 0.9 % IV SOLN
250.6000 mg | Freq: Once | INTRAVENOUS | Status: AC
  Administered 2013-05-24: 250 mg via INTRAVENOUS
  Filled 2013-05-24: qty 25

## 2013-05-24 MED ORDER — SODIUM CHLORIDE 0.9 % IV SOLN
Freq: Once | INTRAVENOUS | Status: AC
  Administered 2013-05-24: 13:00:00 via INTRAVENOUS

## 2013-05-24 MED ORDER — ONDANSETRON 16 MG/50ML IVPB (CHCC)
16.0000 mg | Freq: Once | INTRAVENOUS | Status: AC
  Administered 2013-05-24: 16 mg via INTRAVENOUS

## 2013-05-24 MED ORDER — HEPARIN SOD (PORK) LOCK FLUSH 100 UNIT/ML IV SOLN
500.0000 [IU] | Freq: Once | INTRAVENOUS | Status: AC | PRN
  Administered 2013-05-24: 500 [IU]
  Filled 2013-05-24: qty 5

## 2013-05-24 MED ORDER — ONDANSETRON 16 MG/50ML IVPB (CHCC)
INTRAVENOUS | Status: AC
  Filled 2013-05-24: qty 16

## 2013-05-24 MED ORDER — FAMOTIDINE IN NACL 20-0.9 MG/50ML-% IV SOLN
INTRAVENOUS | Status: AC
  Filled 2013-05-24: qty 50

## 2013-05-24 MED ORDER — DIPHENHYDRAMINE HCL 50 MG/ML IJ SOLN
INTRAMUSCULAR | Status: AC
  Filled 2013-05-24: qty 1

## 2013-05-24 MED ORDER — SODIUM CHLORIDE 0.9 % IJ SOLN
10.0000 mL | INTRAMUSCULAR | Status: DC | PRN
  Administered 2013-05-24: 10 mL
  Filled 2013-05-24: qty 10

## 2013-05-24 NOTE — Progress Notes (Signed)
ID: Ana Young OB: 1960-11-28  MR#: [id number]  CSN#:[id number]  PCP: Maryjean Ka, MD GYN:   SU: Almond Lint OTHER MD: Lurline Hare, Paula Compton  CHIEF COMPLAINT:  Left  Breast Cancer  HISTORY OF PRESENT ILLNESS: Ana Young herself palpated a mass in her left breast 01/15/2013 and brought it to the attention of Dr. Mauricio Po, who arranged for her to have bilateral digital mammography at the breast Center 01/27/2013. The patient does have heterogeneously dense breasts. In the outer upper left breast there was an oval mass by mammography measuring approximately 1.5 cm, which by palpation was firm and measured approximately 1 cm. Ultrasound showed this to be hypoechoic and to measure 1.4 cm.  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  The patient then underwent bilateral breast MRI 02/28/2013. In the left breast upper-outer quadrant the mass in question measured 2.9 cm. There was a second mass lateral to it measuring 1.0 cm. There were no additional abnormal appearing lymph nodes and no evidence of internal mammary adenopathy.  In the right breast there were 2 areas of irregular enhancement in the superior central third of the breast, both measuring 9 mm. These were felt to be worrisome for a ductal carcinoma in situ.  The patient's case was discussed at the multidisciplinary breast cancer conference 03/02/2013. Her subsequent history is as detailed below.  INTERVAL HISTORY: Ana Young returns today accompanied by her husband [name]  As well as her mother and a friend for followup of her locally advanced left breast carcinoma. Today is day 1 cycle 2 of her carboplatin and paclitaxel chemotherapy.    REVIEW OF SYSTEMS: Ana Young did fine with her first cycle of carbo/Taxol. She felt less tired than with the other chemotherapy. She had no nausea or  vomiting. Her appetite was fine. She did develop some numbness in her fingertips, not in her toes. That is a ready much better today and it is very minimal. She has some achiness across her upper shoulders. That has resolved. Otherwise a detailed review of systems today was noncontributory   PAST MEDICAL HISTORY: Past Medical History  Diagnosis Date  . Depression   . Vitamin D deficiency   . Anxiety   . Seasonal allergies   . HTN (hypertension)   . Insomnia   . History of migraines   . Cancer     Left breast cancer    PAST SURGICAL HISTORY: Past Surgical History  Procedure Laterality Date  . Bunionectomy Left 2009  . Portacath placement Left 03/09/2013    Procedure: INSERTION PORT-A-CATH;  Surgeon: Almond Lint, MD;  Location: [facility];  Service: General;  Laterality: Left;    FAMILY HISTORY Family History  Problem Relation Age of Onset  . Heart attack Brother   . Congestive Heart Failure Mother   . Stroke Father   . Diabetes Father   . Breast cancer Maternal Grandmother 85   the patient's parents are in their 85s. The patient has one brother, no sisters. The patient's maternal grandmother was diagnosed with breast cancer the age of 73. There is no other history of breast cancer in the family to the patient's knowledge, and no history of ovarian cancer  GYNECOLOGIC HISTORY:  Menarche age 47, first live birth age 51, the patient is GX P2. Her periods are not irregular, but still every 2-3 months, and otherwise unremarkable.  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 Her husband [name] works as a Actuary. Son [name] lives in [place] and works in Engineering geologist. Daughter Oluwatamilore Starnes is 65 year old and at home. The patient has no grandchildren. She attends the [employer] church    ADVANCED DIRECTIVES: Not in  place  HEALTH MAINTENANCE:  (Updated 04/26/2013) History  Substance Use Topics  . Smoking status: Never Smoker   . Smokeless tobacco: Never Used  . Alcohol Use: No     Colonoscopy: - Never  PAP: Feb 2012, Dr. Jeanice Lim  Bone density: - Never  Lipid panel:  Dec 2012, Dr. Ashley Royalty   No Known Allergies  Current Outpatient Prescriptions  Medication Sig Dispense Refill  . ALPRAZolam (XANAX) 1 MG tablet Take 1 mg by mouth 2 (two) times daily as needed for sleep (1/2 tablet bid as needed).      Marland Kitchen buPROPion (WELLBUTRIN XL) 300 MG 24 hr tablet TAKE 1 TABLET (300 MG TOTAL) BY MOUTH EVERY MORNING.  30 tablet  3  . docusate sodium (COLACE) 100 MG capsule Take 1-2 capsules (100-200 mg total) by mouth every morning. As directed for constipation  30 capsule  1  . ibuprofen (ADVIL,MOTRIN) 800 MG tablet TAKE 1 TABLET (800 MG TOTAL) BY MOUTH EVERY 8 (EIGHT) HOURS AS NEEDED FOR PAIN.  30 tablet  2  . lidocaine-prilocaine (EMLA) cream Apply topically as needed. For chemotherapy  30 g  0  . LORazepam (ATIVAN) 0.5 MG tablet Take 1 tablet (0.5 mg total) by mouth at bedtime as needed for anxiety (Nausea or vomiting).  30 tablet  0  . ondansetron (ZOFRAN) 8 MG tablet Take 1 tablet (8 mg total) by mouth 2 (two) times daily. 1 tab by mouth twice daily x 2 days after chemo, then 1 tab every 12 hrs PRN nausea  30 tablet  1  . polyethylene glycol powder (MIRALAX) powder Take 17 g by mouth daily as needed (constipation).  255 g  1  . prochlorperazine (COMPAZINE) 10 MG tablet       . propranolol ER (INDERAL LA) 120 MG 24 hr capsule TAKE 1 CAPSULE (120 MG TOTAL) BY MOUTH DAILY.  93 capsule  1  . zolpidem (AMBIEN) 5 MG tablet TAKE 1 TABLET BY MOUTH AT BEDTIME AS NEEDED FOR SLEEP  30 tablet  0   No current facility-administered medications for this visit.    OBJECTIVE: Middle-aged [place] American woman  in no acute distress Filed Vitals:   05/24/13 1202  BP: 148/87  Pulse: 106  Temp: 98.4 F (36.9 C)  Resp: 18      Body mass index is 22.36 kg/(m^2).    ECOG FS: 0 Filed Weights   05/24/13 1202  Weight: 142 lb 12.8 oz (64.774 kg)   Physical Exam: Sclerae unicteric, pupils equal and reactive to light Oropharynx  clear and moist  No cervical or supraclavicular adenopathy Lungs no rales or rhonchi Heart regular rate and rhythm Abd soft, nontender, positive bowel sounds n MSK no focal spinal tenderness, no peripheral edema Neuro: nonfocal, well oriented, upper. Affect  Breasts:  deferred   LAB RESULTS:  Lab Results  Component Value Date   WBC 3.6* 05/24/2013   NEUTROABS 2.0 05/24/2013   HGB 10.5* 05/24/2013   HCT 31.9* 05/24/2013   MCV 84.2 05/24/2013   PLT 387 05/24/2013      Chemistry  Component Value Date/Time   NA 140 05/17/2013 1306   NA 138 06/16/2011 0938   K 4.0 05/17/2013 1306   K 4.1 06/16/2011 0938   CL 110 06/16/2011 0938   CO2 26 05/17/2013 1306   CO2 26 06/16/2011 0938   BUN 8.4 05/17/2013 1306   BUN 10 06/16/2011 0938   CREATININE 0.7 05/17/2013 1306   CREATININE 0.81 06/16/2011 0938   CREATININE 0.74 07/26/2010 2332      Component Value Date/Time   CALCIUM 10.0 05/17/2013 1306   CALCIUM 9.5 06/16/2011 0938   ALKPHOS 84 05/17/2013 1306   ALKPHOS 78 06/16/2011 0938   AST 17 05/17/2013 1306   AST 13 06/16/2011 0938   ALT 14 05/17/2013 1306   ALT 10 06/16/2011 0938   BILITOT 0.28 05/17/2013 1306   BILITOT 0.5 06/16/2011 0938      STUDIES:  Echocardiogram on 03/08/2013 showed an ejection fraction of 55-60%.   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 Patient is undergoing neoadjuvant chemotherapy. Assess response to neoadjuvant therapy.  EXAM: MR BILATERAL BREAST WITHOUT AND WITH CONTRAST  TECHNIQUE: Multiplanar, multisequence MR images of both breasts were obtained prior to and following the  intravenous administration of 13ml of MultiHance.  THREE-DIMENSIONAL MR IMAGE RENDERING ON INDEPENDENT WORKSTATION:  Three-dimensional MR images were rendered by post-processing of the original MR data on an independent workstation. The three-dimensional MR images were interpreted, and findings are reported in the following complete MRI report for this study.  COMPARISON:  Previous exams  FINDINGS: Breast composition: c:  Heterogeneous fibroglandular tissue  Background parenchymal enhancement: Mild to moderate.  Right breast: The previously described areas of clumped linear enhancement located within the central right breast which were shown to represent fibrocystic change on MR guided biopsies appear stable. There are no new enhancing foci within the right breast.  Left breast: The previously seen adjacent enhancing masses located within the superior left breast are no longer visible by MR and there is no abnormal enhancement in this region. There are no new enhancing foci.  Lymph nodes: No abnormal appearing lymph nodes.  Ancillary findings:  None.  IMPRESSION: Excellent response to neoadjuvant chemotherapy with no residual enhancing masses visible within the superior left breast. The study is otherwise unchanged.  RECOMMENDATION: Treatment plan.  BI-RADS CATEGORY  6: Known biopsy-proven malignancy - appropriate action should be taken.   Electronically Signed   By: Anselmo Pickler M.D.   On: 05/02/2013 15:46     ASSESSMENT: 52 y.o. Ana Young woman   (1)  status post left breast upper outer quadrant biopsy 02/22/2013 for a clinical T2 N0, stage IIA invasive ductal carcinoma, grade 3, triple negative, with an MIB-1 of 100%.    (2) MRI on 03/02/2013 showed an additional nodule at the 2:00 position, 5 mm lateral to the primary in the left breast, measuring 10 x 6 x 9 mm. This was biopsied on 03/11/2013 and was consistent with high-grade carcinoma [id number] Prognostic panel is pending.   (3) MRI on  03/02/2013 also showed 2 areas of irregularity in the right breast which were worrisome for possible DCIS. Both of these areas were biopsied on 03/04/2013, and both showed fibrocystic changes with no evidence of malignancy ([id number]).   (4) genetics testing is pending  (5)  treated in the neoadjuvant setting with   (a) 4 dose dense cycles of doxorubicin/  cyclophosphamide, with the first dose given on 03/15/2013 and the final one 04/26/2013, with a complete radiologic response  (b) followed by 12 weekly doses of carboplatin/paclitaxel started 05/17/2013, given in standard dose, with all neoadjuvant chemotherapy to be completed prior to definitive surgery.    PLAN: Anaisabel is tolerating the carboplatin and paclitaxel chemotherapy well, but we will have to watch closely for the development of neuropathy. If she does develop neuropathy we will switch to gemcitabine instead of Taxol.  She is very concerned about her disability. That is something I know little about. I have asked our social workers if possible to meet with her today to discuss further.  Otherwise she will see [place] again in a week. She knows to call for any problems that may develop before that visit. Marland Kitchen Lowella Dell, MD   05/24/2013 12:04 PM

## 2013-05-24 NOTE — Patient Instructions (Signed)
[facility] Discharge Instructions for Patients Receiving Chemotherapy  Today you received the following chemotherapy agents: Taxol, Carboplatin  To help prevent nausea and vomiting after your treatment, we encourage you to take your nausea medication as prescribed.    If you develop nausea and vomiting that is not controlled by your nausea medication, call the clinic.   BELOW ARE SYMPTOMS THAT SHOULD BE REPORTED IMMEDIATELY:  *FEVER GREATER THAN 100.5 F  *CHILLS WITH OR WITHOUT FEVER  NAUSEA AND VOMITING THAT IS NOT CONTROLLED WITH YOUR NAUSEA MEDICATION  *UNUSUAL SHORTNESS OF BREATH  *UNUSUAL BRUISING OR BLEEDING  TENDERNESS IN MOUTH AND THROAT WITH OR WITHOUT PRESENCE OF ULCERS  *URINARY PROBLEMS  *BOWEL PROBLEMS  UNUSUAL RASH Items with * indicate a potential emergency and should be followed up as soon as possible.  Feel free to call the clinic you have any questions or concerns. The clinic phone number is [phone].    

## 2013-05-31 ENCOUNTER — Other Ambulatory Visit (HOSPITAL_BASED_OUTPATIENT_CLINIC_OR_DEPARTMENT_OTHER): Payer: BC Managed Care – PPO | Admitting: Lab

## 2013-05-31 ENCOUNTER — Ambulatory Visit (HOSPITAL_BASED_OUTPATIENT_CLINIC_OR_DEPARTMENT_OTHER): Payer: BC Managed Care – PPO

## 2013-05-31 ENCOUNTER — Encounter: Payer: Self-pay | Admitting: Physician Assistant

## 2013-05-31 ENCOUNTER — Ambulatory Visit (HOSPITAL_BASED_OUTPATIENT_CLINIC_OR_DEPARTMENT_OTHER): Payer: BC Managed Care – PPO | Admitting: Physician Assistant

## 2013-05-31 ENCOUNTER — Telehealth: Payer: Self-pay | Admitting: Oncology

## 2013-05-31 VITALS — BP 137/88 | HR 105 | Temp 98.4°F | Resp 19 | Ht 67.0 in | Wt 140.1 lb

## 2013-05-31 DIAGNOSIS — Z5111 Encounter for antineoplastic chemotherapy: Secondary | ICD-10-CM

## 2013-05-31 DIAGNOSIS — C50412 Malignant neoplasm of upper-outer quadrant of left female breast: Secondary | ICD-10-CM

## 2013-05-31 DIAGNOSIS — C50419 Malignant neoplasm of upper-outer quadrant of unspecified female breast: Secondary | ICD-10-CM

## 2013-05-31 DIAGNOSIS — Z171 Estrogen receptor negative status [ER-]: Secondary | ICD-10-CM

## 2013-05-31 LAB — CBC WITH DIFFERENTIAL/PLATELET
BASO%: 2 % (ref 0.0–2.0)
EOS%: 2 % (ref 0.0–7.0)
HCT: 32.9 % — ABNORMAL LOW (ref 34.8–46.6)
MCH: 27.9 pg (ref 25.1–34.0)
MCHC: 32.2 g/dL (ref 31.5–36.0)
MONO#: 0.3 10*3/uL (ref 0.1–0.9)
NEUT#: 1.8 10*3/uL (ref 1.5–6.5)
NEUT%: 60.2 % (ref 38.4–76.8)
Platelets: 283 10*3/uL (ref 145–400)
RBC: 3.8 10*6/uL (ref 3.70–5.45)
RDW: 16.5 % — ABNORMAL HIGH (ref 11.2–14.5)
WBC: 3.1 10*3/uL — ABNORMAL LOW (ref 3.9–10.3)
lymph#: 0.8 10*3/uL — ABNORMAL LOW (ref 0.9–3.3)
nRBC: 0 % (ref 0–0)

## 2013-05-31 MED ORDER — SODIUM CHLORIDE 0.9 % IV SOLN
Freq: Once | INTRAVENOUS | Status: AC
  Administered 2013-05-31: 10:00:00 via INTRAVENOUS

## 2013-05-31 MED ORDER — HEPARIN SOD (PORK) LOCK FLUSH 100 UNIT/ML IV SOLN
500.0000 [IU] | Freq: Once | INTRAVENOUS | Status: AC | PRN
  Administered 2013-05-31: 500 [IU]
  Filled 2013-05-31: qty 5

## 2013-05-31 MED ORDER — PACLITAXEL CHEMO INJECTION 300 MG/50ML
80.0000 mg/m2 | Freq: Once | INTRAVENOUS | Status: AC
  Administered 2013-05-31: 138 mg via INTRAVENOUS
  Filled 2013-05-31: qty 23

## 2013-05-31 MED ORDER — ONDANSETRON 16 MG/50ML IVPB (CHCC)
INTRAVENOUS | Status: AC
  Filled 2013-05-31: qty 16

## 2013-05-31 MED ORDER — FAMOTIDINE IN NACL 20-0.9 MG/50ML-% IV SOLN
INTRAVENOUS | Status: AC
  Filled 2013-05-31: qty 50

## 2013-05-31 MED ORDER — DIPHENHYDRAMINE HCL 50 MG/ML IJ SOLN
INTRAMUSCULAR | Status: AC
  Filled 2013-05-31: qty 1

## 2013-05-31 MED ORDER — DIPHENHYDRAMINE HCL 50 MG/ML IJ SOLN
50.0000 mg | Freq: Once | INTRAMUSCULAR | Status: AC
  Administered 2013-05-31: 50 mg via INTRAVENOUS

## 2013-05-31 MED ORDER — SODIUM CHLORIDE 0.9 % IJ SOLN
10.0000 mL | INTRAMUSCULAR | Status: DC | PRN
  Administered 2013-05-31: 10 mL
  Filled 2013-05-31: qty 10

## 2013-05-31 MED ORDER — DEXAMETHASONE SODIUM PHOSPHATE 20 MG/5ML IJ SOLN
20.0000 mg | Freq: Once | INTRAMUSCULAR | Status: AC
  Administered 2013-05-31: 20 mg via INTRAVENOUS

## 2013-05-31 MED ORDER — FAMOTIDINE IN NACL 20-0.9 MG/50ML-% IV SOLN
20.0000 mg | Freq: Once | INTRAVENOUS | Status: AC
  Administered 2013-05-31: 20 mg via INTRAVENOUS

## 2013-05-31 MED ORDER — DEXAMETHASONE SODIUM PHOSPHATE 20 MG/5ML IJ SOLN
INTRAMUSCULAR | Status: AC
  Filled 2013-05-31: qty 5

## 2013-05-31 MED ORDER — ONDANSETRON 16 MG/50ML IVPB (CHCC)
16.0000 mg | Freq: Once | INTRAVENOUS | Status: AC
  Administered 2013-05-31: 16 mg via INTRAVENOUS

## 2013-05-31 MED ORDER — SODIUM CHLORIDE 0.9 % IV SOLN
250.6000 mg | Freq: Once | INTRAVENOUS | Status: AC
  Administered 2013-05-31: 250 mg via INTRAVENOUS
  Filled 2013-05-31: qty 25

## 2013-05-31 NOTE — Patient Instructions (Signed)
[facility] Discharge Instructions for Patients Receiving Chemotherapy  Today you received the following chemotherapy agents Taxol, Carboplatin.  To help prevent nausea and vomiting after your treatment, we encourage you to take your nausea medication Zofran beginning morning of 11/26. Take 1 tablet twice per day for 2 days. Take Compazine every 6 hours as needed.   If you develop nausea and vomiting that is not controlled by your nausea medication, call the clinic.   BELOW ARE SYMPTOMS THAT SHOULD BE REPORTED IMMEDIATELY:  *FEVER GREATER THAN 100.5 F  *CHILLS WITH OR WITHOUT FEVER  NAUSEA AND VOMITING THAT IS NOT CONTROLLED WITH YOUR NAUSEA MEDICATION  *UNUSUAL SHORTNESS OF BREATH  *UNUSUAL BRUISING OR BLEEDING  TENDERNESS IN MOUTH AND THROAT WITH OR WITHOUT PRESENCE OF ULCERS  *URINARY PROBLEMS  *BOWEL PROBLEMS  UNUSUAL RASH Items with * indicate a potential emergency and should be followed up as soon as possible.  Feel free to call the clinic should you have any questions or concerns. The clinic phone number is [phone].

## 2013-05-31 NOTE — Progress Notes (Signed)
ID: [name] OB: [birth year]  MR#: [id number]  CSN#:[id number]  PCP: Maryjean Ka, MD GYN:   SU: Almond Lint OTHER MD: Lurline Hare, Paula Compton  CHIEF COMPLAINT:  Left  Breast Cancer  HISTORY OF PRESENT ILLNESS: Velvet herself palpated a mass in her left breast 01/15/2013 and brought it to the attention of Dr. Mauricio Po, who arranged for her to have bilateral digital mammography at the breast Center 01/27/2013. The patient does have heterogeneously dense breasts. In the outer upper left breast there was an oval mass by mammography measuring approximately 1.5 cm, which by palpation was firm and measured approximately 1 cm. Ultrasound showed this to be hypoechoic and to measure 1.4 cm.  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  The patient then underwent bilateral breast MRI 02/28/2013. In the left breast upper-outer quadrant the mass in question measured 2.9 cm. There was a second mass lateral to it measuring 1.0 cm. There were no additional abnormal appearing lymph nodes and no evidence of internal mammary adenopathy.  In the right breast there were 2 areas of irregular enhancement in the superior central third of the breast, both measuring 9 mm. These were felt to be worrisome for a ductal carcinoma in situ.  The patient's case was discussed at the multidisciplinary breast cancer conference 03/02/2013. Her subsequent history is as detailed below.  INTERVAL HISTORY: Jency returns today accompanied by her husband [name] for followup of her locally advanced left breast carcinoma. She is due for her third of 12 planned weekly doses of carboplatin/paclitaxel today.   She's tolerating treatment very well. She initially noted some numbness and tingling in the tips of her fingers, but feels that this has resolved. She still able to perform all  of her fine motor skills without problems. She had some constipation last week which caused mild nausea. She used MiraLAX and that took care of the problem. She is now having regular bowel movements and she's had no additional nausea or emesis.   REVIEW OF SYSTEMS: Joee denies any fevers or chills. She's had no skin changes other than dry skin, and she denies any signs of abnormal bleeding. She's had no abdominal pain. She denies any mouth ulcers or oral sensitivity. She denies any cough, shortness of breath, or chest pain. She's had no abnormal headaches or dizziness. She denies any new or unusual myalgias or arthralgias and has had no peripheral swelling.  A detailed review of systems is otherwise noncontributory.   PAST MEDICAL HISTORY: Past Medical History  Diagnosis Date  . Depression   . Vitamin D deficiency   . Anxiety   . Seasonal allergies   . HTN (hypertension)   . Insomnia   . History of migraines   . Cancer     Left breast cancer  . Breast cancer     PAST SURGICAL HISTORY: Past Surgical History  Procedure Laterality Date  . Bunionectomy Left 2009  . Portacath placement Left 03/09/2013    Procedure: INSERTION PORT-A-CATH;  Surgeon: Almond Lint, MD;  Location: [facility];  Service: General;  Laterality: Left;    FAMILY HISTORY Family History  Problem Relation Age of Onset  . Heart attack Brother   . Congestive Heart Failure Mother   . Stroke Father   . Diabetes Father   . Breast cancer Maternal Grandmother 56   the patient's parents are in their  18s. The patient has one brother, no sisters. The patient's maternal grandmother was diagnosed with breast cancer the age of 26. There is no other history of breast cancer in the family to the patient's knowledge, and no history of ovarian cancer  GYNECOLOGIC HISTORY:  Menarche age 51, first live birth age 81, the patient is GX P2. Her periods are not irregular, but still every 2-3 months, and otherwise  unremarkable.  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 Her husband [name] works as a Actuary. Son [name] lives in [place] and works in Engineering geologist. Daughter Jamee Keach is 22 year old and at home. The patient has no grandchildren. She attends the [employer]    ADVANCED DIRECTIVES: Not in place  HEALTH MAINTENANCE:  (Updated 04/26/2013) History  Substance Use Topics  . Smoking status: Never Smoker   . Smokeless tobacco: Never Used  . Alcohol Use: No     Colonoscopy: - Never  PAP: Feb 2012, Dr. Jeanice Lim  Bone density: - Never  Lipid panel:  Dec 2012, Dr. Ashley Royalty   No Known Allergies  Current Outpatient Prescriptions  Medication Sig Dispense Refill  . ALPRAZolam (XANAX) 1 MG tablet Take 1 mg by mouth 2 (two) times daily as needed for sleep (1/2 tablet bid as needed).      Marland Kitchen buPROPion (WELLBUTRIN XL) 300 MG 24 hr tablet TAKE 1 TABLET (300 MG TOTAL) BY MOUTH EVERY MORNING.  30 tablet  3  . docusate sodium (COLACE) 100 MG capsule Take 1-2 capsules (100-200 mg total) by mouth every morning. As directed for constipation  30 capsule  1  . ibuprofen (ADVIL,MOTRIN) 800 MG tablet TAKE 1 TABLET (800 MG TOTAL) BY MOUTH EVERY 8 (EIGHT) HOURS AS NEEDED FOR PAIN.  30 tablet  2  . lidocaine-prilocaine (EMLA) cream Apply topically as needed. For chemotherapy  30 g  0  . LORazepam (ATIVAN) 0.5 MG tablet Take 1 tablet (0.5 mg total) by mouth at bedtime as needed for anxiety (Nausea or vomiting).  30 tablet  0  . ondansetron (ZOFRAN) 8 MG tablet Take 1 tablet (8 mg total) by mouth 2 (two) times daily. 1 tab by mouth twice daily x 2 days after chemo, then 1 tab every 12 hrs PRN nausea  30 tablet  1  . polyethylene glycol powder (MIRALAX) powder Take 17 g by mouth daily as needed (constipation).  255 g  1  . prochlorperazine (COMPAZINE) 10 MG  tablet       . propranolol ER (INDERAL LA) 120 MG 24 hr capsule TAKE 1 CAPSULE (120 MG TOTAL) BY MOUTH DAILY.  93 capsule  1  . zolpidem (AMBIEN) 5 MG tablet TAKE 1 TABLET BY MOUTH AT BEDTIME AS NEEDED FOR SLEEP  30 tablet  0   No current facility-administered medications for this visit.    OBJECTIVE: Middle-aged [place] American woman  in no acute distress Filed Vitals:   05/31/13 0837  BP: 137/88  Pulse: 105  Temp: 98.4 F (36.9 C)  Resp: 19     Body mass index is 21.94 kg/(m^2).    ECOG FS: 0 Filed Weights   05/31/13 0837  Weight: 140 lb 1.6 oz (63.549 kg)   Physical Exam: HEENT:  Sclerae anicteric.  Oropharynx clear. No ulcerations or mucositis. NODES:  No cervical or supraclavicular lymphadenopathy palpated.  BREAST EXAM: Right breast is  status post biopsy, otherwise unremarkable. Unable to palpate a distinct mass in the left breast. Axillae are benign, no palpable lymphadenopathy. LUNGS:  Clear to auscultation bilaterally.  No wheezes or rhonchi HEART:  Regular rate and rhythm. No murmur appreciated. ABDOMEN:  Soft, nontender.  Positive bowel sounds.  MSK:  No focal spinal tenderness to palpation. Full range of motion in the upper extremities. EXTREMITIES:  No peripheral edema.   SKIN:  Port is intact in the left upper chest wall, with no erythema or edema, no evidence of cellulitis. On the upper extremities, skin and nail beds are hyperpigmented bilaterally, but with no drainage from the [facility] no evidence of infection. NEURO:  Nonfocal. Well oriented.  Positive affect.    LAB RESULTS:  Lab Results  Component Value Date   WBC 3.1* 05/31/2013   NEUTROABS 1.8 05/31/2013   HGB 10.6* 05/31/2013   HCT 32.9* 05/31/2013   MCV 86.6 05/31/2013   PLT 283 05/31/2013      Chemistry      Component Value Date/Time   NA 139 05/24/2013 1140   NA 138 06/16/2011 0938   K 3.9 05/24/2013 1140   K 4.1 06/16/2011 0938   CL 110 06/16/2011 0938   CO2 26 05/24/2013 1140   CO2 26  06/16/2011 0938   BUN 9.9 05/24/2013 1140   BUN 10 06/16/2011 0938   CREATININE 0.7 05/24/2013 1140   CREATININE 0.81 06/16/2011 0938   CREATININE 0.74 07/26/2010 2332      Component Value Date/Time   CALCIUM 9.9 05/24/2013 1140   CALCIUM 9.5 06/16/2011 0938   ALKPHOS 76 05/24/2013 1140   ALKPHOS 78 06/16/2011 0938   AST 23 05/24/2013 1140   AST 13 06/16/2011 0938   ALT 28 05/24/2013 1140   ALT 10 06/16/2011 0938   BILITOT 0.21 05/24/2013 1140   BILITOT 0.5 06/16/2011 0938      STUDIES:  Echocardiogram on 03/08/2013 showed an ejection fraction of 55-60%.   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 Patient is undergoing neoadjuvant chemotherapy. Assess response to neoadjuvant therapy.  EXAM: MR BILATERAL BREAST WITHOUT AND WITH CONTRAST  TECHNIQUE: Multiplanar, multisequence MR images of both breasts were obtained prior to and following the intravenous administration of 13ml of MultiHance.  THREE-DIMENSIONAL MR IMAGE RENDERING ON INDEPENDENT WORKSTATION:  Three-dimensional MR images were rendered by post-processing of the original MR data on an independent workstation. The three-dimensional MR images were interpreted, and findings are reported in the following complete MRI report for this study.  COMPARISON:  Previous exams  FINDINGS: Breast composition: c:  Heterogeneous fibroglandular tissue  Background parenchymal enhancement: Mild to moderate.  Right breast: The previously described areas of clumped linear enhancement located within the central right breast which were shown to represent fibrocystic change on MR guided biopsies appear stable. There are no new enhancing foci within the right breast.  Left breast: The previously seen adjacent enhancing masses located within the superior left breast are no longer visible by MR and there is no  abnormal enhancement in this region. There are no new enhancing foci.  Lymph nodes: No abnormal appearing lymph nodes.  Ancillary findings:  None.  IMPRESSION: Excellent response to neoadjuvant chemotherapy with no residual enhancing masses visible within the superior left breast. The study is otherwise unchanged.  RECOMMENDATION: Treatment plan.  BI-RADS CATEGORY  6: Known biopsy-proven malignancy -  appropriate action should be taken.   Electronically Signed   By: Anselmo Pickler M.D.   On: 05/02/2013 15:46     ASSESSMENT: 52 y.o. [place]line Peals woman   (1)  status post left breast upper outer quadrant biopsy 02/22/2013 for a clinical T2 N0, stage IIA invasive ductal carcinoma, grade 3, triple negative, with an MIB-1 of 100%.    (2) MRI on 03/02/2013 showed an additional nodule at the 2:00 position, 5 mm lateral to the primary in the left breast, measuring 10 x 6 x 9 mm. This was biopsied on 03/11/2013 and was consistent with high-grade carcinoma [phone]). Prognostic panel is pending.   (3) MRI on 03/02/2013 also showed 2 areas of irregularity in the right breast which were worrisome for possible DCIS. Both of these areas were biopsied on 03/04/2013, and both showed fibrocystic changes with no evidence of malignancy ([id number]).   (4) genetics testing is pending  (5)  treated in the neoadjuvant setting with   (a) 4 dose dense cycles of doxorubicin/ cyclophosphamide, with the first dose given on 03/15/2013 and the final one 04/26/2013, with a complete radiologic response  (b) followed by 12 weekly doses of carboplatin/paclitaxel started 05/17/2013, given in standard dose, with all neoadjuvant chemotherapy to be completed prior to definitive surgery.    PLAN: Sofi continues to tolerate this regimen well and will proceed for her third weekly dose today as scheduled. We'll continue to follow her very closely for any signs of peripheral neuropathy, and if necessary we will discontinue paclitaxel  and switched to gemcitabine.  At this point, however, this is not an issue.  I will continue to see Tashea weekly with labs and physical exam prior to each dose of chemotherapy. All this was reviewed with her in detail and she voices understanding and agreement with our plan. As always, she knows to call with any changes or problems.  Zollie Scale, PA-C   05/31/2013 9:02 AM

## 2013-06-03 ENCOUNTER — Telehealth: Payer: Self-pay | Admitting: *Deleted

## 2013-06-03 NOTE — Telephone Encounter (Signed)
Per staff message and POF I have scheduled appts.  JMW  

## 2013-06-04 ENCOUNTER — Encounter: Payer: Self-pay | Admitting: Oncology

## 2013-06-07 ENCOUNTER — Ambulatory Visit (HOSPITAL_BASED_OUTPATIENT_CLINIC_OR_DEPARTMENT_OTHER): Payer: BC Managed Care – PPO

## 2013-06-07 ENCOUNTER — Ambulatory Visit (HOSPITAL_BASED_OUTPATIENT_CLINIC_OR_DEPARTMENT_OTHER): Payer: BC Managed Care – PPO | Admitting: Physician Assistant

## 2013-06-07 ENCOUNTER — Other Ambulatory Visit (HOSPITAL_BASED_OUTPATIENT_CLINIC_OR_DEPARTMENT_OTHER): Payer: BC Managed Care – PPO | Admitting: Lab

## 2013-06-07 ENCOUNTER — Encounter (INDEPENDENT_AMBULATORY_CARE_PROVIDER_SITE_OTHER): Payer: Self-pay

## 2013-06-07 ENCOUNTER — Encounter: Payer: Self-pay | Admitting: Physician Assistant

## 2013-06-07 VITALS — BP 158/91 | HR 101 | Temp 98.4°F | Resp 18 | Ht 67.0 in | Wt 142.0 lb

## 2013-06-07 DIAGNOSIS — Z5111 Encounter for antineoplastic chemotherapy: Secondary | ICD-10-CM

## 2013-06-07 DIAGNOSIS — C50412 Malignant neoplasm of upper-outer quadrant of left female breast: Secondary | ICD-10-CM

## 2013-06-07 DIAGNOSIS — C50419 Malignant neoplasm of upper-outer quadrant of unspecified female breast: Secondary | ICD-10-CM

## 2013-06-07 DIAGNOSIS — D649 Anemia, unspecified: Secondary | ICD-10-CM

## 2013-06-07 DIAGNOSIS — Z171 Estrogen receptor negative status [ER-]: Secondary | ICD-10-CM

## 2013-06-07 HISTORY — DX: Anemia, unspecified: D64.9

## 2013-06-07 LAB — CBC WITH DIFFERENTIAL/PLATELET
BASO%: 1.3 % (ref 0.0–2.0)
Basophils Absolute: 0 10*3/uL (ref 0.0–0.1)
EOS%: 1 % (ref 0.0–7.0)
HCT: 30.1 % — ABNORMAL LOW (ref 34.8–46.6)
HGB: 9.7 g/dL — ABNORMAL LOW (ref 11.6–15.9)
LYMPH%: 20.7 % (ref 14.0–49.7)
MCH: 28.1 pg (ref 25.1–34.0)
MCV: 87.2 fL (ref 79.5–101.0)
MONO%: 5.6 % (ref 0.0–14.0)
NEUT%: 71.4 % (ref 38.4–76.8)
Platelets: 237 10*3/uL (ref 145–400)
RDW: 17.4 % — ABNORMAL HIGH (ref 11.2–14.5)
lymph#: 0.6 10*3/uL — ABNORMAL LOW (ref 0.9–3.3)

## 2013-06-07 LAB — COMPREHENSIVE METABOLIC PANEL (CC13)
ALT: 59 U/L — ABNORMAL HIGH (ref 0–55)
AST: 36 U/L — ABNORMAL HIGH (ref 5–34)
Alkaline Phosphatase: 91 U/L (ref 40–150)
BUN: 7.5 mg/dL (ref 7.0–26.0)
Creatinine: 0.7 mg/dL (ref 0.6–1.1)
Glucose: 95 mg/dl (ref 70–140)
Potassium: 3.9 mEq/L (ref 3.5–5.1)

## 2013-06-07 MED ORDER — DIPHENHYDRAMINE HCL 50 MG/ML IJ SOLN
50.0000 mg | Freq: Once | INTRAMUSCULAR | Status: AC
  Administered 2013-06-07: 50 mg via INTRAVENOUS

## 2013-06-07 MED ORDER — ONDANSETRON 16 MG/50ML IVPB (CHCC)
16.0000 mg | Freq: Once | INTRAVENOUS | Status: AC
  Administered 2013-06-07: 16 mg via INTRAVENOUS

## 2013-06-07 MED ORDER — PACLITAXEL CHEMO INJECTION 300 MG/50ML
80.0000 mg/m2 | Freq: Once | INTRAVENOUS | Status: AC
  Administered 2013-06-07: 138 mg via INTRAVENOUS
  Filled 2013-06-07: qty 23

## 2013-06-07 MED ORDER — ONDANSETRON 16 MG/50ML IVPB (CHCC)
INTRAVENOUS | Status: AC
  Filled 2013-06-07: qty 16

## 2013-06-07 MED ORDER — SODIUM CHLORIDE 0.9 % IJ SOLN
10.0000 mL | INTRAMUSCULAR | Status: DC | PRN
  Administered 2013-06-07: 10 mL
  Filled 2013-06-07: qty 10

## 2013-06-07 MED ORDER — HEPARIN SOD (PORK) LOCK FLUSH 100 UNIT/ML IV SOLN
500.0000 [IU] | Freq: Once | INTRAVENOUS | Status: AC | PRN
  Administered 2013-06-07: 500 [IU]
  Filled 2013-06-07: qty 5

## 2013-06-07 MED ORDER — FAMOTIDINE IN NACL 20-0.9 MG/50ML-% IV SOLN
20.0000 mg | Freq: Once | INTRAVENOUS | Status: AC
  Administered 2013-06-07: 20 mg via INTRAVENOUS

## 2013-06-07 MED ORDER — SODIUM CHLORIDE 0.9 % IV SOLN
250.6000 mg | Freq: Once | INTRAVENOUS | Status: AC
  Administered 2013-06-07: 250 mg via INTRAVENOUS
  Filled 2013-06-07: qty 25

## 2013-06-07 MED ORDER — SODIUM CHLORIDE 0.9 % IV SOLN
Freq: Once | INTRAVENOUS | Status: AC
  Administered 2013-06-07: 11:00:00 via INTRAVENOUS

## 2013-06-07 MED ORDER — DEXAMETHASONE SODIUM PHOSPHATE 20 MG/5ML IJ SOLN
INTRAMUSCULAR | Status: AC
  Filled 2013-06-07: qty 5

## 2013-06-07 MED ORDER — DIPHENHYDRAMINE HCL 50 MG/ML IJ SOLN
INTRAMUSCULAR | Status: AC
  Filled 2013-06-07: qty 1

## 2013-06-07 MED ORDER — FAMOTIDINE IN NACL 20-0.9 MG/50ML-% IV SOLN
INTRAVENOUS | Status: AC
  Filled 2013-06-07: qty 50

## 2013-06-07 MED ORDER — DEXAMETHASONE SODIUM PHOSPHATE 20 MG/5ML IJ SOLN
20.0000 mg | Freq: Once | INTRAMUSCULAR | Status: AC
  Administered 2013-06-07: 20 mg via INTRAVENOUS

## 2013-06-07 NOTE — Progress Notes (Signed)
ID: Ana Young OB: [birth year]  MR#: [id number]  CSN#:[id number]  PCP: Maryjean Ka, MD GYN:   SU: Ana Young OTHER MD: Ana Young, Ana Young  CHIEF COMPLAINT:  Left  Breast Cancer  HISTORY OF PRESENT ILLNESS: Ana Young herself palpated a mass in her left breast 01/15/2013 and brought it to the attention of Ana Young, who arranged for her to have bilateral digital mammography at the breast Center 01/27/2013. The patient does have heterogeneously dense breasts. In the outer upper left breast there was an oval mass by mammography measuring approximately 1.5 cm, which by palpation was firm and measured approximately 1 cm. Ultrasound showed this to be hypoechoic and to measure 1.4 cm.  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  The patient then underwent bilateral breast MRI 02/28/2013. In the left breast upper-outer quadrant the mass in question measured 2.9 cm. There was a second mass lateral to it measuring 1.0 cm. There were no additional abnormal appearing lymph nodes and no evidence of internal mammary adenopathy.  In the right breast there were 2 areas of irregular enhancement in the superior central third of the breast, both measuring 9 mm. These were felt to be worrisome for a ductal carcinoma in situ.  The patient's case was discussed at the multidisciplinary breast cancer conference 03/02/2013. Her subsequent history is as detailed below.  INTERVAL HISTORY: Ana Young returns today accompanied by her husband Ana Young for followup of her locally advanced left breast carcinoma. She is due for her 4th of 12 planned weekly doses of carboplatin/paclitaxel today which she is tolerating well.  In fact, interval history is quite unremarkable, and with the exception of mild fatigue [name] has no complaints today.  REVIEW OF SYSTEMS: [name]  denies any fevers or chills. She's had no skin changes other than dry skin and mild hyperpigmentation, and she denies any signs of abnormal bruising or bleeding. She's had no abdominal pain and denies any nausea, emesis, or change in bowel or bladder habits. She denies any mouth ulcers or oral sensitivity. She denies any cough, increased shortness of breath, or chest pain. She's had no abnormal headaches, change in vision or dizziness. She denies any new or unusual myalgias or arthralgias and has had no peripheral swelling. Fortunately, she also denies any signs of peripheral neuropathy in either the upper or lower extremities.  A detailed review of systems is otherwise noncontributory.   PAST MEDICAL HISTORY: Past Medical History  Diagnosis Date  . Depression   . Vitamin D deficiency   . Anxiety   . Seasonal allergies   . HTN (hypertension)   . Insomnia   . History of migraines   . Cancer     Left breast cancer  . Breast cancer   . Anemia 06/07/2013    PAST SURGICAL HISTORY: Past Surgical History  Procedure Laterality Date  . Bunionectomy Left 2009  . Portacath placement Left 03/09/2013    Procedure: INSERTION PORT-A-CATH;  Surgeon: Ana Lint, MD;  Location: [facility];  Service: General;  Laterality: Left;    FAMILY HISTORY Family History  Problem Relation Age of Onset  . Heart attack Brother   . Congestive Heart Failure Mother   . Stroke Father   . Diabetes Father   . Breast cancer Maternal Grandmother 53   the patient's parents are in their 80s. The patient has one brother, no sisters. The  patient's maternal grandmother was diagnosed with breast cancer the age of 71. There is no other history of breast cancer in the family to the patient's knowledge, and no history of ovarian cancer  GYNECOLOGIC HISTORY:  Menarche age 11, first live birth age 17, the patient is GX P2. Her periods are not irregular, but still every 2-3 months, and otherwise  unremarkable.  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 Her husband Ana Young works as a Actuary. Son Ana Young lives in [place] and works in Engineering geologist. Daughter Ana Young is 56 year old and at home. The patient has no grandchildren. She attends the [employer]    ADVANCED DIRECTIVES: Not in place  HEALTH MAINTENANCE:  (Updated 04/26/2013) History  Substance Use Topics  . Smoking status: Never Smoker   . Smokeless tobacco: Never Used  . Alcohol Use: No     Colonoscopy: - Never  PAP: Feb 2012, Dr. Jeanice Lim  Bone density: - Never  Lipid panel:  Dec 2012, Dr. Ashley Royalty   No Known Allergies  Current Outpatient Prescriptions  Medication Sig Dispense Refill  . ALPRAZolam (XANAX) 1 MG tablet Take 1 mg by mouth 2 (two) times daily as needed for sleep (1/2 tablet bid as needed).      Marland Kitchen buPROPion (WELLBUTRIN XL) 300 MG 24 hr tablet TAKE 1 TABLET (300 MG TOTAL) BY MOUTH EVERY MORNING.  30 tablet  3  . docusate sodium (COLACE) 100 MG capsule Take 1-2 capsules (100-200 mg total) by mouth every morning. As directed for constipation  30 capsule  1  . ibuprofen (ADVIL,MOTRIN) 800 MG tablet TAKE 1 TABLET (800 MG TOTAL) BY MOUTH EVERY 8 (EIGHT) HOURS AS NEEDED FOR PAIN.  30 tablet  2  . lidocaine-prilocaine (EMLA) cream Apply topically as needed. For chemotherapy  30 g  0  . LORazepam (ATIVAN) 0.5 MG tablet Take 1 tablet (0.5 mg total) by mouth at bedtime as needed for anxiety (Nausea or vomiting).  30 tablet  0  . ondansetron (ZOFRAN) 8 MG tablet Take 1 tablet (8 mg total) by mouth 2 (two) times daily. 1 tab by mouth twice daily x 2 days after chemo, then 1 tab every 12 hrs PRN nausea  30 tablet  1  . polyethylene glycol powder (MIRALAX) powder Take 17 g by mouth daily as needed (constipation).  255 g  1  . prochlorperazine (COMPAZINE) 10 MG  tablet       . propranolol ER (INDERAL LA) 120 MG 24 hr capsule TAKE 1 CAPSULE (120 MG TOTAL) BY MOUTH DAILY.  93 capsule  1  . zolpidem (AMBIEN) 5 MG tablet TAKE 1 TABLET BY MOUTH AT BEDTIME AS NEEDED FOR SLEEP  30 tablet  0   No current facility-administered medications for this visit.    OBJECTIVE: Middle-aged [place] American woman  in no acute distress Filed Vitals:   06/07/13 0923  BP: 158/91  Pulse: 101  Temp: 98.4 F (36.9 C)  Resp: 18     Body mass index is 22.24 kg/(m^2).    ECOG FS: 0 Filed Weights   06/07/13 0923  Weight: 142 lb (64.411 kg)   Physical Exam: HEENT:  Sclerae anicteric.  Oropharynx clear and moist. No ulcerations or mucositis. NODES:  No cervical or supraclavicular lymphadenopathy palpated.  BREAST EXAM: Deferred. Axillae are benign, no palpable lymphadenopathy. LUNGS:  Clear to auscultation  bilaterally.  No wheezes or rhonchi.  Good excursion bilaterally. HEART:  Regular rate and rhythm. No murmur. ABDOMEN:  Soft, nontender.  Positive bowel sounds.  MSK:  No focal spinal tenderness to palpation. Full range of motion in the upper extremities. No joint swelling EXTREMITIES:  No peripheral edema.   SKIN:  Port is intact in the left upper chest wall, with no erythema or edema, no evidence of cellulitis. On the upper extremities, skin and nail beds are hyperpigmented bilaterally, but with no evidence of drainage and no evidence of infection. NEURO:  Nonfocal. Well oriented.  Positive affect.    LAB RESULTS:  Lab Results  Component Value Date   WBC 3.1* 06/07/2013   NEUTROABS 2.2 06/07/2013   HGB 9.7* 06/07/2013   HCT 30.1* 06/07/2013   MCV 87.2 06/07/2013   PLT 237 06/07/2013      Chemistry      Component Value Date/Time   NA 139 05/24/2013 1140   NA 138 06/16/2011 0938   K 3.9 05/24/2013 1140   K 4.1 06/16/2011 0938   CL 110 06/16/2011 0938   CO2 26 05/24/2013 1140   CO2 26 06/16/2011 0938   BUN 9.9 05/24/2013 1140   BUN 10 06/16/2011 0938    CREATININE 0.7 05/24/2013 1140   CREATININE 0.81 06/16/2011 0938   CREATININE 0.74 07/26/2010 2332      Component Value Date/Time   CALCIUM 9.9 05/24/2013 1140   CALCIUM 9.5 06/16/2011 0938   ALKPHOS 76 05/24/2013 1140   ALKPHOS 78 06/16/2011 0938   AST 23 05/24/2013 1140   AST 13 06/16/2011 0938   ALT 28 05/24/2013 1140   ALT 10 06/16/2011 0938   BILITOT 0.21 05/24/2013 1140   BILITOT 0.5 06/16/2011 0938      STUDIES:  Echocardiogram on 03/08/2013 showed an ejection fraction of 55-60%.   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 Patient is undergoing neoadjuvant chemotherapy. Assess response to neoadjuvant therapy.  EXAM: MR BILATERAL BREAST WITHOUT AND WITH CONTRAST  TECHNIQUE: Multiplanar, multisequence MR images of both breasts were obtained prior to and following the intravenous administration of 13ml of MultiHance.  THREE-DIMENSIONAL MR IMAGE RENDERING ON INDEPENDENT WORKSTATION:  Three-dimensional MR images were rendered by post-processing of the original MR data on an independent workstation. The three-dimensional MR images were interpreted, and findings are reported in the following complete MRI report for this study.  COMPARISON:  Previous exams  FINDINGS: Breast composition: c:  Heterogeneous fibroglandular tissue  Background parenchymal enhancement: Mild to moderate.  Right breast: The previously described areas of clumped linear enhancement located within the central right breast which were shown to represent fibrocystic change on MR guided biopsies appear stable. There are no new enhancing foci within the right breast.  Left breast: The previously seen adjacent enhancing masses located within the superior left breast are no longer visible by MR and there is no abnormal enhancement in this region. There are no new enhancing foci.   Lymph nodes: No abnormal appearing lymph nodes.  Ancillary findings:  None.  IMPRESSION: Excellent response to neoadjuvant chemotherapy with no residual enhancing masses visible within the superior left breast. The study is otherwise unchanged.  RECOMMENDATION: Treatment plan.  BI-RADS CATEGORY  6: Known biopsy-proven malignancy - appropriate action should be taken.   Electronically Signed   By: Anselmo Pickler M.D.   On: 05/02/2013  15:46     ASSESSMENT: 52 y.o. [place] woman   (1)  status post left breast upper outer quadrant biopsy 02/22/2013 for a clinical T2 N0, stage IIA invasive ductal carcinoma, grade 3, triple negative, with an MIB-1 of 100%.    (2) MRI on 03/02/2013 showed an additional nodule at the 2:00 position, 5 mm lateral to the primary in the left breast, measuring 10 x 6 x 9 mm. This was biopsied on 03/11/2013 and was consistent with high-grade carcinoma [phone]). Prognostic panel is pending.   (3) MRI on 03/02/2013 also showed 2 areas of irregularity in the right breast which were worrisome for possible DCIS. Both of these areas were biopsied on 03/04/2013, and both showed fibrocystic changes with no evidence of malignancy ([id number]).   (4) genetics testing is pending  (5)  treated in the neoadjuvant setting with   (a) 4 dose dense cycles of doxorubicin/ cyclophosphamide, with the first dose given on 03/15/2013 and the final one 04/26/2013, with a complete radiologic response  (b) followed by 12 weekly doses of carboplatin/paclitaxel started 05/17/2013, given in standard dose, with all neoadjuvant chemotherapy to be completed prior to definitive surgery.    PLAN: Ana Young will proceed to treatment today as scheduled for her fourth weekly dose of carboplatin/paclitaxel. For now, we will continue to see her on a weekly basis, but if she continues to tolerate treatment this well over the next couple of weeks, we might spread out our office visits to every 2 weeks. Of  course, we will continue to follow her very closely for any signs of peripheral neuropathy, and if necessary we will discontinue paclitaxel and switched to gemcitabine.  Fortunately, at this time, but this is not a problem.   Ana Young voices understanding and agreement with the above plan, and will call with any changes or problems prior to her appointment with me next week. Ana Scale, PA-C   06/07/2013 10:00 AM

## 2013-06-07 NOTE — Patient Instructions (Signed)
[facility] Discharge Instructions for Patients Receiving Chemotherapy  Today you received the following chemotherapy agents: Taxol and Carboplatin.  To help prevent nausea and vomiting after your treatment, we encourage you to take your nausea medication as prescribed.   If you develop nausea and vomiting that is not controlled by your nausea medication, call the clinic.   BELOW ARE SYMPTOMS THAT SHOULD BE REPORTED IMMEDIATELY:  *FEVER GREATER THAN 100.5 F  *CHILLS WITH OR WITHOUT FEVER  NAUSEA AND VOMITING THAT IS NOT CONTROLLED WITH YOUR NAUSEA MEDICATION  *UNUSUAL SHORTNESS OF BREATH  *UNUSUAL BRUISING OR BLEEDING  TENDERNESS IN MOUTH AND THROAT WITH OR WITHOUT PRESENCE OF ULCERS  *URINARY PROBLEMS  *BOWEL PROBLEMS  UNUSUAL RASH Items with * indicate a potential emergency and should be followed up as soon as possible.  Feel free to call the clinic you have any questions or concerns. The clinic phone number is [phone].    

## 2013-06-10 ENCOUNTER — Encounter: Payer: Self-pay | Admitting: Oncology

## 2013-06-14 ENCOUNTER — Telehealth: Payer: Self-pay | Admitting: *Deleted

## 2013-06-14 ENCOUNTER — Ambulatory Visit (HOSPITAL_BASED_OUTPATIENT_CLINIC_OR_DEPARTMENT_OTHER): Payer: BC Managed Care – PPO | Admitting: Physician Assistant

## 2013-06-14 ENCOUNTER — Encounter: Payer: Self-pay | Admitting: Oncology

## 2013-06-14 ENCOUNTER — Other Ambulatory Visit: Payer: Self-pay | Admitting: *Deleted

## 2013-06-14 ENCOUNTER — Encounter: Payer: Self-pay | Admitting: Physician Assistant

## 2013-06-14 ENCOUNTER — Ambulatory Visit (HOSPITAL_BASED_OUTPATIENT_CLINIC_OR_DEPARTMENT_OTHER): Payer: BC Managed Care – PPO

## 2013-06-14 ENCOUNTER — Other Ambulatory Visit (HOSPITAL_BASED_OUTPATIENT_CLINIC_OR_DEPARTMENT_OTHER): Payer: BC Managed Care – PPO | Admitting: Lab

## 2013-06-14 VITALS — BP 153/93 | HR 103 | Temp 98.2°F | Resp 18 | Ht 67.0 in | Wt 139.0 lb

## 2013-06-14 DIAGNOSIS — Z171 Estrogen receptor negative status [ER-]: Secondary | ICD-10-CM

## 2013-06-14 DIAGNOSIS — Z5111 Encounter for antineoplastic chemotherapy: Secondary | ICD-10-CM

## 2013-06-14 DIAGNOSIS — C50412 Malignant neoplasm of upper-outer quadrant of left female breast: Secondary | ICD-10-CM

## 2013-06-14 DIAGNOSIS — C50419 Malignant neoplasm of upper-outer quadrant of unspecified female breast: Secondary | ICD-10-CM

## 2013-06-14 DIAGNOSIS — I10 Essential (primary) hypertension: Secondary | ICD-10-CM

## 2013-06-14 DIAGNOSIS — D649 Anemia, unspecified: Secondary | ICD-10-CM

## 2013-06-14 LAB — CBC WITH DIFFERENTIAL/PLATELET
BASO%: 0.9 % (ref 0.0–2.0)
Basophils Absolute: 0 10*3/uL (ref 0.0–0.1)
EOS%: 0.5 % (ref 0.0–7.0)
HCT: 30.1 % — ABNORMAL LOW (ref 34.8–46.6)
HGB: 9.8 g/dL — ABNORMAL LOW (ref 11.6–15.9)
LYMPH%: 32.9 % (ref 14.0–49.7)
MCH: 28.6 pg (ref 25.1–34.0)
MCHC: 32.6 g/dL (ref 31.5–36.0)
MONO%: 10.2 % (ref 0.0–14.0)
NEUT%: 55.5 % (ref 38.4–76.8)
Platelets: 255 10*3/uL (ref 145–400)
RBC: 3.43 10*6/uL — ABNORMAL LOW (ref 3.70–5.45)
lymph#: 0.7 10*3/uL — ABNORMAL LOW (ref 0.9–3.3)
nRBC: 0 % (ref 0–0)

## 2013-06-14 MED ORDER — DIPHENHYDRAMINE HCL 50 MG/ML IJ SOLN
50.0000 mg | Freq: Once | INTRAMUSCULAR | Status: AC
  Administered 2013-06-14: 50 mg via INTRAVENOUS

## 2013-06-14 MED ORDER — DEXAMETHASONE SODIUM PHOSPHATE 20 MG/5ML IJ SOLN
20.0000 mg | Freq: Once | INTRAMUSCULAR | Status: AC
  Administered 2013-06-14: 20 mg via INTRAVENOUS

## 2013-06-14 MED ORDER — SODIUM CHLORIDE 0.9 % IV SOLN
250.6000 mg | Freq: Once | INTRAVENOUS | Status: AC
  Administered 2013-06-14: 250 mg via INTRAVENOUS
  Filled 2013-06-14: qty 25

## 2013-06-14 MED ORDER — SODIUM CHLORIDE 0.9 % IV SOLN
Freq: Once | INTRAVENOUS | Status: AC
  Administered 2013-06-14: 13:00:00 via INTRAVENOUS

## 2013-06-14 MED ORDER — ONDANSETRON 16 MG/50ML IVPB (CHCC)
INTRAVENOUS | Status: AC
  Filled 2013-06-14: qty 16

## 2013-06-14 MED ORDER — PACLITAXEL CHEMO INJECTION 300 MG/50ML: 80.0000 mg/m2 | Freq: Once | INTRAVENOUS | Status: DC

## 2013-06-14 MED ORDER — LIDOCAINE-PRILOCAINE 2.5-2.5 % EX CREA: TOPICAL_CREAM | CUTANEOUS | Status: DC | PRN

## 2013-06-14 MED ORDER — SODIUM CHLORIDE 0.9 % IJ SOLN
10.0000 mL | INTRAMUSCULAR | Status: DC | PRN
  Administered 2013-06-14: 10 mL
  Filled 2013-06-14: qty 10

## 2013-06-14 MED ORDER — FAMOTIDINE IN NACL 20-0.9 MG/50ML-% IV SOLN
20.0000 mg | Freq: Once | INTRAVENOUS | Status: AC
  Administered 2013-06-14: 20 mg via INTRAVENOUS

## 2013-06-14 MED ORDER — ONDANSETRON 16 MG/50ML IVPB (CHCC)
16.0000 mg | Freq: Once | INTRAVENOUS | Status: AC
  Administered 2013-06-14: 16 mg via INTRAVENOUS

## 2013-06-14 MED ORDER — HEPARIN SOD (PORK) LOCK FLUSH 100 UNIT/ML IV SOLN
500.0000 [IU] | Freq: Once | INTRAVENOUS | Status: AC | PRN
  Administered 2013-06-14: 500 [IU]
  Filled 2013-06-14: qty 5

## 2013-06-14 MED ORDER — SODIUM CHLORIDE 0.9 % IV SOLN
80.0000 mg/m2 | Freq: Once | INTRAVENOUS | Status: AC
  Administered 2013-06-14: 138 mg via INTRAVENOUS
  Filled 2013-06-14: qty 23

## 2013-06-14 MED ORDER — DEXAMETHASONE SODIUM PHOSPHATE 20 MG/5ML IJ SOLN
INTRAMUSCULAR | Status: AC
  Filled 2013-06-14: qty 5

## 2013-06-14 MED ORDER — DIPHENHYDRAMINE HCL 50 MG/ML IJ SOLN
INTRAMUSCULAR | Status: AC
  Filled 2013-06-14: qty 1

## 2013-06-14 MED ORDER — FAMOTIDINE IN NACL 20-0.9 MG/50ML-% IV SOLN
INTRAVENOUS | Status: AC
  Filled 2013-06-14: qty 50

## 2013-06-14 NOTE — Telephone Encounter (Signed)
Pt came by to cancel 07/11/13 labs...td

## 2013-06-14 NOTE — Progress Notes (Signed)
Per Zollie Scale, PA, it's OK to treat today despite counts.

## 2013-06-14 NOTE — Progress Notes (Signed)
ID: [name] OB: 06/15/1961  MR#: [id number]  CSN#:[id number]  PCP: Maryjean Ka, MD GYN:   SU: Almond Lint OTHER MD: Lurline Hare, Paula Compton  CHIEF COMPLAINT:  Left  Breast Cancer  HISTORY OF PRESENT ILLNESS: Ana Young herself palpated a mass in her left breast 01/15/2013 and brought it to the attention of Dr. Mauricio Po, who arranged for her to have bilateral digital mammography at the breast Center 01/27/2013. The patient does have heterogeneously dense breasts. In the outer upper left breast there was an oval mass by mammography measuring approximately 1.5 cm, which by palpation was firm and measured approximately 1 cm. Ultrasound showed this to be hypoechoic and to measure 1.4 cm.  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  The patient then underwent bilateral breast MRI 02/28/2013. In the left breast upper-outer quadrant the mass in question measured 2.9 cm. There was a second mass lateral to it measuring 1.0 cm. There were no additional abnormal appearing lymph nodes and no evidence of internal mammary adenopathy.  In the right breast there were 2 areas of irregular enhancement in the superior central third of the breast, both measuring 9 mm. These were felt to be worrisome for a ductal carcinoma in situ.  The patient's case was discussed at the multidisciplinary breast cancer conference 03/02/2013. Her subsequent history is as detailed below.  INTERVAL HISTORY: Ana Young returns today accompanied by her mother, her aunt, and her husband [name] for followup of her locally advanced left breast carcinoma. She is due for her 5th of 12 planned weekly doses of carboplatin/paclitaxel today which she continues to tolerate well.  Overall, Ana Young is feeling well. She's had occasional "twinge" of discomfort in the port this past week. This comes  and goes, primarily with movement. It resolves quickly. She's had no redness, warmth to touch, drainage, or swelling.   There were also has a history of hypertension, and was holding her propranolol generating  AC every 2 a drop in blood pressure. Blood pressure has actually started to go up again, elevated today at 153/93. It was 158/91 last week on 06/07/2013. She will resume that medication accordingly, and will keep track of her blood pressure at home over the next week.   REVIEW OF SYSTEMS: Farris continues to be mildly fatigued. She denies any fevers or chills. She has some hyperpigmentation on the hands and nailbeds bilaterally, and some peeling on the soles of her feet. She's had no pain or sensitivity, and denies any additional skin changes or rashes. She's had no signs of abnormal bleeding. Her appetite is fairly good, and she's had no nausea, emesis, or change in bowel or bladder habits. She also denies any oral ulcerations, sensitivity, or problems swallowing. She has a little bit of a running nose, but denies any sinus congestion, cough, phlegm production, shortness of breath, chest pain, palpitations. She's had no abnormal headaches or dizziness. She denies any unusual myalgias, arthralgias, or bony pain, and has had no peripheral swelling. She also denies any problems with numbness or tingling in the upper or lower extremities.  A detailed review of systems is otherwise stable and noncontributory.    PAST MEDICAL HISTORY: Past Medical History  Diagnosis Date  . Depression   . Vitamin D deficiency   . Anxiety   . Seasonal allergies   . HTN (hypertension)   . Insomnia   . History of  migraines   . Cancer     Left breast cancer  . Breast cancer   . Anemia 06/07/2013    PAST SURGICAL HISTORY: Past Surgical History  Procedure Laterality Date  . Bunionectomy Left 2009  . Portacath placement Left 03/09/2013    Procedure: INSERTION PORT-A-CATH;  Surgeon: Almond Lint, MD;  Location:  [facility];  Service: General;  Laterality: Left;    FAMILY HISTORY Family History  Problem Relation Age of Onset  . Heart attack Brother   . Congestive Heart Failure Mother   . Stroke Father   . Diabetes Father   . Breast cancer Maternal Grandmother 35   the patient's parents are in their 56s. The patient has one brother, no sisters. The patient's maternal grandmother was diagnosed with breast cancer the age of 36. There is no other history of breast cancer in the family to the patient's knowledge, and no history of ovarian cancer  GYNECOLOGIC HISTORY:  Menarche age 8, first live birth age 52, the patient is GX P2. Her periods are not irregular, but still every 2-3 months, and otherwise unremarkable.  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 Her husband [name] works as a Actuary. Son Ana Young lives in [place] and works in Engineering geologist. Daughter Ana Young is 50 year old and at home. The patient has no grandchildren. She attends the [employer]    ADVANCED DIRECTIVES: Not in place  HEALTH MAINTENANCE:  (Updated December 2014) History  Substance Use Topics  . Smoking status: Never Smoker   . Smokeless tobacco: Never Used  . Alcohol Use: No     Colonoscopy: - Never  PAP: Feb 2012, Dr. Jeanice Lim  Bone density: - Never  Lipid panel:  Dec 2012, Dr. Ashley Royalty   No Known Allergies  Current Outpatient Prescriptions  Medication Sig Dispense Refill  . ALPRAZolam (XANAX) 1 MG tablet Take 1 mg by mouth 2 (two) times daily as needed for sleep (1/2 tablet bid as needed).      Marland Kitchen buPROPion (WELLBUTRIN XL) 300 MG 24 hr tablet TAKE 1 TABLET (300 MG TOTAL) BY MOUTH EVERY MORNING.  30 tablet  3  . docusate sodium (COLACE) 100 MG capsule Take 1-2 capsules (100-200 mg total) by mouth every morning. As directed for constipation   30 capsule  1  . ibuprofen (ADVIL,MOTRIN) 800 MG tablet TAKE 1 TABLET (800 MG TOTAL) BY MOUTH EVERY 8 (EIGHT) HOURS AS NEEDED FOR PAIN.  30 tablet  2  . LORazepam (ATIVAN) 0.5 MG tablet Take 1 tablet (0.5 mg total) by mouth at bedtime as needed for anxiety (Nausea or vomiting).  30 tablet  0  . ondansetron (ZOFRAN) 8 MG tablet Take 1 tablet (8 mg total) by mouth 2 (two) times daily. 1 tab by mouth twice daily x 2 days after chemo, then 1 tab every 12 hrs PRN nausea  30 tablet  1  . polyethylene glycol powder (MIRALAX) powder Take 17 g by mouth daily as needed (constipation).  255 g  1  . prochlorperazine (COMPAZINE) 10 MG tablet       . propranolol ER (INDERAL LA) 120 MG 24 hr capsule TAKE 1 CAPSULE (120 MG TOTAL) BY MOUTH DAILY.  93 capsule  1  . zolpidem (AMBIEN) 5 MG tablet TAKE 1 TABLET BY MOUTH AT BEDTIME AS NEEDED FOR SLEEP  30 tablet  0  . lidocaine-prilocaine (EMLA) cream Apply topically as needed. For chemotherapy  30 g  0   No current facility-administered medications for this visit.    OBJECTIVE: Middle-aged [place] American woman  in no acute distress Filed Vitals:   06/14/13 1111  BP: 153/93  Pulse: 103  Temp: 98.2 F (36.8 C)  Resp: 18     Body mass index is 21.77 kg/(m^2).    ECOG FS: 0 Filed Weights   06/14/13 1111  Weight: 139 lb (63.05 kg)   Physical Exam: HEENT:  Sclerae anicteric.  Oropharynx clear with no ulcerations or evidence of candidiasis. NODES:  No cervical or supraclavicular lymphadenopathy palpated.  BREAST EXAM: Deferred. Axillae are benign, no palpable lymphadenopathy. LUNGS:  Clear to auscultation bilaterally.  No wheezes or rhonchi.   HEART:  Regular rate and rhythm. No murmur appreciated ABDOMEN:  Soft, nontender.  Positive bowel sounds.  MSK:  No focal spinal tenderness to palpation. Full range of motion in the upper extremities. No joint swelling EXTREMITIES:  No peripheral edema.   SKIN:  Port is intact in the left upper chest wall, with no  erythema or edema, no evidence of cellulitis. The area is nontender to palpation. Hyperpigmentation on the hands and nailbeds bilaterally, but no drainage or evidence of infection. NEURO:  Nonfocal. Well oriented.  Positive affect.    LAB RESULTS:  Lab Results  Component Value Date   WBC 2.2* 06/14/2013   NEUTROABS 1.2* 06/14/2013   HGB 9.8* 06/14/2013   HCT 30.1* 06/14/2013   MCV 87.8 06/14/2013   PLT 255 06/14/2013      Chemistry      Component Value Date/Time   NA 139 06/07/2013 0910   NA 138 06/16/2011 0938   K 3.9 06/07/2013 0910   K 4.1 06/16/2011 0938   CL 110 06/16/2011 0938   CO2 24 06/07/2013 0910   CO2 26 06/16/2011 0938   BUN 7.5 06/07/2013 0910   BUN 10 06/16/2011 0938   CREATININE 0.7 06/07/2013 0910   CREATININE 0.81 06/16/2011 0938   CREATININE 0.74 07/26/2010 2332      Component Value Date/Time   CALCIUM 9.7 06/07/2013 0910   CALCIUM 9.5 06/16/2011 0938   ALKPHOS 91 06/07/2013 0910   ALKPHOS 78 06/16/2011 0938   AST 36* 06/07/2013 0910   AST 13 06/16/2011 0938   ALT 59* 06/07/2013 0910   ALT 10 06/16/2011 0938   BILITOT 0.33 06/07/2013 0910   BILITOT 0.5 06/16/2011 0938      STUDIES:  Echocardiogram on 03/08/2013 showed an ejection fraction of 55-60%.   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 Patient is undergoing neoadjuvant chemotherapy. Assess response to neoadjuvant therapy.  EXAM: MR BILATERAL BREAST WITHOUT AND WITH CONTRAST  TECHNIQUE: Multiplanar, multisequence MR images of both breasts were obtained prior to and following the intravenous administration of 13ml of MultiHance.  THREE-DIMENSIONAL MR IMAGE RENDERING ON INDEPENDENT WORKSTATION:  Three-dimensional MR images were rendered by post-processing of the original MR data on an independent workstation. The three-dimensional MR images were interpreted,  and findings are reported in the following complete MRI report for this study.  COMPARISON:  Previous exams  FINDINGS: Breast composition: c:  Heterogeneous fibroglandular tissue  Background parenchymal enhancement: Mild to moderate.  Right breast: The previously described areas of clumped linear enhancement located within the central right breast which were  shown to represent fibrocystic change on MR guided biopsies appear stable. There are no new enhancing foci within the right breast.  Left breast: The previously seen adjacent enhancing masses located within the superior left breast are no longer visible by MR and there is no abnormal enhancement in this region. There are no new enhancing foci.  Lymph nodes: No abnormal appearing lymph nodes.  Ancillary findings:  None.  IMPRESSION: Excellent response to neoadjuvant chemotherapy with no residual enhancing masses visible within the superior left breast. The study is otherwise unchanged.  RECOMMENDATION: Treatment plan.  BI-RADS CATEGORY  6: Known biopsy-proven malignancy - appropriate action should be taken.   Electronically Signed   By: Anselmo Pickler M.D.   On: 05/02/2013 15:46     ASSESSMENT: 52 y.o. Adline Peals woman   (1)  status post left breast upper outer quadrant biopsy 02/22/2013 for a clinical T2 N0, stage IIA invasive ductal carcinoma, grade 3, triple negative, with an MIB-1 of 100%.    (2) MRI on 03/02/2013 showed an additional nodule at the 2:00 position, 5 mm lateral to the primary in the left breast, measuring 10 x 6 x 9 mm. This was biopsied on 03/11/2013 and was consistent with high-grade carcinoma [phone]). Prognostic panel is pending.   (3) MRI on 03/02/2013 also showed 2 areas of irregularity in the right breast which were worrisome for possible DCIS. Both of these areas were biopsied on 03/04/2013, and both showed fibrocystic changes with no evidence of malignancy ([id number]).   (4) genetics testing is pending  (5)   treated in the neoadjuvant setting with   (a) 4 dose dense cycles of doxorubicin/ cyclophosphamide, with the first dose given on 03/15/2013 and the final one 04/26/2013, with a complete radiologic response  (b) followed by 12 weekly doses of carboplatin/paclitaxel started 05/17/2013, given in standard dose, with all neoadjuvant chemotherapy to be completed prior to definitive surgery.    PLAN: Jerica will proceed to treatment today as scheduled for her fifth weekly dose of carboplatin/paclitaxel. As noted above, she is restarting her hypertensive medication, and will follow her blood pressure at home over the next week. We will reassess when I see her in one week, December 16, prior to her sixth weekly dose of carboplatin/paclitaxel. Of course, we will continue to follow her very closely for any signs of peripheral neuropathy, and if necessary we will discontinue paclitaxel and switched to gemcitabine.  Fortunately, at this time, but this is not a problem.   I think the pain she has felt around the port is likely associated with the healing process, but I have cautioned her to let us know if she has any fevers, increased pain, redness, or swelling.   I will mention that Deidra's ANC is a little low today at 1.2. We will proceed with chemotherapy, and we'll continue to follow her CBC closely on a weekly basis. If the ANC drops below 1.0 we will consider holding x1 week, then reassess. Again, she was reminded to contact us with any fevers of 100.3 or above.  Lakeita voices understanding and agreement with our plan as stated above. She will call prior to her appointment next week with any changes or problems.  Zollie Scale, PA-C   06/14/2013 12:38 PM

## 2013-06-14 NOTE — Patient Instructions (Signed)
[facility] Discharge Instructions for Patients Receiving Chemotherapy  Today you received the following chemotherapy agents: Taxol and Carboplatin.  To help prevent nausea and vomiting after your treatment, we encourage you to take your nausea medication as prescribed.   If you develop nausea and vomiting that is not controlled by your nausea medication, call the clinic.   BELOW ARE SYMPTOMS THAT SHOULD BE REPORTED IMMEDIATELY:  *FEVER GREATER THAN 100.5 F  *CHILLS WITH OR WITHOUT FEVER  NAUSEA AND VOMITING THAT IS NOT CONTROLLED WITH YOUR NAUSEA MEDICATION  *UNUSUAL SHORTNESS OF BREATH  *UNUSUAL BRUISING OR BLEEDING  TENDERNESS IN MOUTH AND THROAT WITH OR WITHOUT PRESENCE OF ULCERS  *URINARY PROBLEMS  *BOWEL PROBLEMS  UNUSUAL RASH Items with * indicate a potential emergency and should be followed up as soon as possible.  Feel free to call the clinic you have any questions or concerns. The clinic phone number is [phone].    

## 2013-06-21 ENCOUNTER — Telehealth: Payer: Self-pay | Admitting: Oncology

## 2013-06-21 ENCOUNTER — Encounter: Payer: Self-pay | Admitting: Physician Assistant

## 2013-06-21 ENCOUNTER — Telehealth: Payer: Self-pay | Admitting: *Deleted

## 2013-06-21 ENCOUNTER — Ambulatory Visit: Payer: BC Managed Care – PPO | Admitting: *Deleted

## 2013-06-21 ENCOUNTER — Ambulatory Visit (HOSPITAL_BASED_OUTPATIENT_CLINIC_OR_DEPARTMENT_OTHER): Payer: BC Managed Care – PPO | Admitting: Oncology

## 2013-06-21 ENCOUNTER — Ambulatory Visit (HOSPITAL_BASED_OUTPATIENT_CLINIC_OR_DEPARTMENT_OTHER): Payer: BC Managed Care – PPO | Admitting: Physician Assistant

## 2013-06-21 ENCOUNTER — Encounter: Payer: Self-pay | Admitting: Oncology

## 2013-06-21 VITALS — BP 131/69 | HR 86 | Temp 97.9°F | Resp 18 | Ht 67.0 in | Wt 139.1 lb

## 2013-06-21 DIAGNOSIS — C50412 Malignant neoplasm of upper-outer quadrant of left female breast: Secondary | ICD-10-CM

## 2013-06-21 DIAGNOSIS — C50419 Malignant neoplasm of upper-outer quadrant of unspecified female breast: Secondary | ICD-10-CM

## 2013-06-21 DIAGNOSIS — D649 Anemia, unspecified: Secondary | ICD-10-CM

## 2013-06-21 DIAGNOSIS — Z171 Estrogen receptor negative status [ER-]: Secondary | ICD-10-CM

## 2013-06-21 DIAGNOSIS — Z5111 Encounter for antineoplastic chemotherapy: Secondary | ICD-10-CM

## 2013-06-21 LAB — CBC WITH DIFFERENTIAL/PLATELET
Basophils Absolute: 0 10*3/uL (ref 0.0–0.1)
EOS%: 0.4 % (ref 0.0–7.0)
Eosinophils Absolute: 0 10*3/uL (ref 0.0–0.5)
HGB: 10.4 g/dL — ABNORMAL LOW (ref 11.6–15.9)
MCH: 29.2 pg (ref 25.1–34.0)
MCV: 89.9 fL (ref 79.5–101.0)
MONO%: 8.3 % (ref 0.0–14.0)
NEUT#: 1.3 10*3/uL — ABNORMAL LOW (ref 1.5–6.5)
RBC: 3.56 10*6/uL — ABNORMAL LOW (ref 3.70–5.45)
RDW: 17.9 % — ABNORMAL HIGH (ref 11.2–14.5)
lymph#: 0.9 10*3/uL (ref 0.9–3.3)
nRBC: 0 % (ref 0–0)

## 2013-06-21 LAB — COMPREHENSIVE METABOLIC PANEL (CC13)
ALT: 24 U/L (ref 0–55)
Albumin: 3.9 g/dL (ref 3.5–5.0)
Alkaline Phosphatase: 102 U/L (ref 40–150)
BUN: 12.4 mg/dL (ref 7.0–26.0)
Glucose: 95 mg/dl (ref 70–140)
Potassium: 4.3 mEq/L (ref 3.5–5.1)
Sodium: 140 mEq/L (ref 136–145)
Total Bilirubin: 0.32 mg/dL (ref 0.20–1.20)
Total Protein: 7.2 g/dL (ref 6.4–8.3)

## 2013-06-21 MED ORDER — DIPHENHYDRAMINE HCL 50 MG/ML IJ SOLN
INTRAMUSCULAR | Status: AC
  Filled 2013-06-21: qty 1

## 2013-06-21 MED ORDER — ONDANSETRON 16 MG/50ML IVPB (CHCC)
16.0000 mg | Freq: Once | INTRAVENOUS | Status: AC
  Administered 2013-06-21: 16 mg via INTRAVENOUS

## 2013-06-21 MED ORDER — DEXAMETHASONE SODIUM PHOSPHATE 20 MG/5ML IJ SOLN
INTRAMUSCULAR | Status: AC
  Filled 2013-06-21: qty 5

## 2013-06-21 MED ORDER — FAMOTIDINE IN NACL 20-0.9 MG/50ML-% IV SOLN
20.0000 mg | Freq: Once | INTRAVENOUS | Status: AC
Start: 2013-06-21 — End: 2013-06-21
  Administered 2013-06-21: 20 mg via INTRAVENOUS

## 2013-06-21 MED ORDER — SODIUM CHLORIDE 0.9 % IJ SOLN
10.0000 mL | INTRAMUSCULAR | Status: DC | PRN
  Administered 2013-06-21: 10 mL
  Filled 2013-06-21: qty 10

## 2013-06-21 MED ORDER — FAMOTIDINE IN NACL 20-0.9 MG/50ML-% IV SOLN
INTRAVENOUS | Status: AC
  Filled 2013-06-21: qty 50

## 2013-06-21 MED ORDER — ONDANSETRON 16 MG/50ML IVPB (CHCC)
INTRAVENOUS | Status: AC
  Filled 2013-06-21: qty 16

## 2013-06-21 MED ORDER — DEXAMETHASONE SODIUM PHOSPHATE 20 MG/5ML IJ SOLN
20.0000 mg | Freq: Once | INTRAMUSCULAR | Status: AC
  Administered 2013-06-21: 20 mg via INTRAVENOUS

## 2013-06-21 MED ORDER — SODIUM CHLORIDE 0.9 % IV SOLN
250.6000 mg | Freq: Once | INTRAVENOUS | Status: AC
  Administered 2013-06-21: 250 mg via INTRAVENOUS
  Filled 2013-06-21: qty 25

## 2013-06-21 MED ORDER — HEPARIN SOD (PORK) LOCK FLUSH 100 UNIT/ML IV SOLN
500.0000 [IU] | Freq: Once | INTRAVENOUS | Status: AC | PRN
  Administered 2013-06-21: 500 [IU]
  Filled 2013-06-21: qty 5

## 2013-06-21 MED ORDER — DIPHENHYDRAMINE HCL 50 MG/ML IJ SOLN
50.0000 mg | Freq: Once | INTRAMUSCULAR | Status: AC
  Administered 2013-06-21: 50 mg via INTRAVENOUS

## 2013-06-21 MED ORDER — SODIUM CHLORIDE 0.9 % IV SOLN
Freq: Once | INTRAVENOUS | Status: AC
  Administered 2013-06-21: 11:00:00 via INTRAVENOUS

## 2013-06-21 MED ORDER — SODIUM CHLORIDE 0.9 % IV SOLN
80.0000 mg/m2 | Freq: Once | INTRAVENOUS | Status: AC
  Administered 2013-06-21: 138 mg via INTRAVENOUS
  Filled 2013-06-21: qty 23

## 2013-06-21 NOTE — Patient Instructions (Signed)
[facility] Discharge Instructions for Patients Receiving Chemotherapy  Today you received the following chemotherapy agents:  Taxol & Carboplatin  To help prevent nausea and vomiting after your treatment, we encourage you to take your nausea medication    If you develop nausea and vomiting that is not controlled by your nausea medication, call the clinic.   BELOW ARE SYMPTOMS THAT SHOULD BE REPORTED IMMEDIATELY:  *FEVER GREATER THAN 100.5 F  *CHILLS WITH OR WITHOUT FEVER  NAUSEA AND VOMITING THAT IS NOT CONTROLLED WITH YOUR NAUSEA MEDICATION  *UNUSUAL SHORTNESS OF BREATH  *UNUSUAL BRUISING OR BLEEDING  TENDERNESS IN MOUTH AND THROAT WITH OR WITHOUT PRESENCE OF ULCERS  *URINARY PROBLEMS  *BOWEL PROBLEMS  UNUSUAL RASH Items with * indicate a potential emergency and should be followed up as soon as possible.  Feel free to call the clinic you have any questions or concerns. The clinic phone number is [phone].    

## 2013-06-21 NOTE — Progress Notes (Signed)
OK to treat with ANC-1.3 per Dr. Magrinat. 

## 2013-06-21 NOTE — Telephone Encounter (Signed)
Per staff message and POF I have scheduled appts.  JMW  

## 2013-06-21 NOTE — Progress Notes (Signed)
ID: Ana Young OB: 1960/08/14  MR#: [id number]  JYN#:[id number]  PCP: Ana Ka, MD GYN:   SU: Ana Young OTHER MD: Ana Young, Ana Young  CHIEF COMPLAINT:  Left  Breast Cancer  HISTORY OF PRESENT ILLNESS: Ana Young herself palpated a mass in her left breast 01/15/2013 and brought it to the attention of Ana Young, who arranged for her to have bilateral digital mammography at the breast Center 01/27/2013. The patient does have heterogeneously dense breasts. In the outer upper left breast there was an oval mass by mammography measuring approximately 1.5 cm, which by palpation was firm and measured approximately 1 cm. Ultrasound showed this to be hypoechoic and to measure 1.4 cm.  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  The patient then underwent bilateral breast MRI 02/28/2013. In the left breast upper-outer quadrant the mass in question measured 2.9 cm. There was a second mass lateral to it measuring 1.0 cm. There were no additional abnormal appearing lymph nodes and no evidence of internal mammary adenopathy.  In the right breast there were 2 areas of irregular enhancement in the superior central third of the breast, both measuring 9 mm. These were felt to be worrisome for a ductal carcinoma in situ.  The patient's case was discussed at the multidisciplinary breast cancer conference 03/02/2013. Her subsequent history is as detailed below.  INTERVAL HISTORY: Ana Young returns today accompanied by her husband Ana Young for followup of her locally advanced left breast carcinoma. She is due for her 6th of 12 planned weekly doses of neoadjuvant carboplatin/paclitaxel today which she continues to tolerate well.  Ana Young is a little tearful on presentation today. She recently took the [employer] Exam past only 5 of the 6 parts. This  has been extremely frustrating for Ana Young, especially due to the fact that she is also undergoing aggressive chemotherapy and has the expected "brain fog" that many of our patients complain about. 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  Physically, Ana Young is tolerating the chemotherapy well. She continues to deny any signs of peripheral neuropathy. She still has some peeling of the skin on her feet, but it is not tender and is improving. Her nail beds are dark, but there is no drainage or evidence of infection. They're only slightly tender. She has some slight blurred vision.   REVIEW OF SYSTEMS: Ana Young denies any fevers or chills. She continues to be mildly fatigued. She's had no signs of abnormal bruising or bleeding. Her appetite is good, and she's had no nausea, emesis, or change in bowel or bladder habits. She also denies any oral ulcerations, sensitivity, or problems swallowing. She has a little bit of a runny nose, but denies any sinus congestion, cough, phlegm production, shortness of breath, chest pain, palpitations. There is occasionally some slight blood in the mucus, but no actual epistasis. She's had no abnormal headaches or dizziness. She denies any unusual myalgias, arthralgias, or bony pain, and has had no peripheral swelling.   A detailed review of systems is otherwise stable and noncontributory.    PAST MEDICAL HISTORY: Past Medical History  Diagnosis Date  . Depression   . Vitamin D deficiency   . Anxiety   . Seasonal allergies   . HTN (hypertension)   .  Insomnia   . History of migraines   . Cancer     Left breast cancer  . Breast cancer   . Anemia 06/07/2013    PAST SURGICAL HISTORY: Past Surgical History  Procedure Laterality Date  . Bunionectomy Left 2009  . Portacath placement Left 03/09/2013    Procedure: INSERTION PORT-A-CATH;   Surgeon: Ana Lint, MD;  Location: [facility];  Service: General;  Laterality: Left;    FAMILY HISTORY Family History  Problem Relation Age of Onset  . Heart attack Brother   . Congestive Heart Failure Mother   . Stroke Father   . Diabetes Father   . Breast cancer Maternal Grandmother 60   the patient's parents are in their 40s. The patient has one brother, no sisters. The patient's maternal grandmother was diagnosed with breast cancer the age of 40. There is no other history of breast cancer in the family to the patient's knowledge, and no history of ovarian cancer  GYNECOLOGIC HISTORY:  Menarche age 38, first live birth age 81, the patient is GX P2. Her periods are not irregular, but still every 2-3 months, and otherwise unremarkable.  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 Her husband Ana Young works as a Actuary. Son Ana Young lives in [place] and works in Engineering geologist. Daughter Ana Young is 46 year old and at home. The patient has no grandchildren. She attends the [employer]    ADVANCED DIRECTIVES: Not in place  HEALTH MAINTENANCE:  (Updated December 2014) History  Substance Use Topics  . Smoking status: Never Smoker   . Smokeless tobacco: Never Used  . Alcohol Use: No     Colonoscopy: - Never  PAP: Feb 2012, Dr. Jeanice Lim  Bone density: - Never  Lipid panel:  Dec 2012, Dr. Ashley Royalty   No Known Allergies  Current Outpatient Prescriptions  Medication Sig Dispense Refill  . ALPRAZolam (XANAX) 1 MG tablet Take 1 mg by mouth 2 (two) times daily as needed for sleep (1/2 tablet bid as needed).      Marland Kitchen buPROPion (WELLBUTRIN XL) 300 MG 24 hr tablet TAKE 1 TABLET (300 MG TOTAL) BY MOUTH EVERY MORNING.  30 tablet  3  . docusate sodium (COLACE) 100 MG capsule Take 1-2 capsules (100-200 mg total) by mouth every  morning. As directed for constipation  30 capsule  1  . ibuprofen (ADVIL,MOTRIN) 800 MG tablet TAKE 1 TABLET (800 MG TOTAL) BY MOUTH EVERY 8 (EIGHT) HOURS AS NEEDED FOR PAIN.  30 tablet  2  . lidocaine-prilocaine (EMLA) cream Apply topically as needed. For chemotherapy  30 g  0  . LORazepam (ATIVAN) 0.5 MG tablet Take 1 tablet (0.5 mg total) by mouth at bedtime as needed for anxiety (Nausea or vomiting).  30 tablet  0  . ondansetron (ZOFRAN) 8 MG tablet Take 1 tablet (8 mg total) by mouth 2 (two) times daily. 1 tab by mouth twice daily x 2 days after chemo, then 1 tab every 12 hrs PRN nausea  30 tablet  1  . polyethylene glycol powder (MIRALAX) powder Take 17 g by mouth daily as needed (constipation).  255 g  1  . prochlorperazine (COMPAZINE) 10 MG tablet       . propranolol ER (INDERAL LA) 120 MG 24 hr capsule TAKE 1 CAPSULE (120 MG TOTAL) BY MOUTH DAILY.  93 capsule  1  . zolpidem (AMBIEN) 5 MG tablet TAKE 1 TABLET BY MOUTH AT BEDTIME AS NEEDED FOR SLEEP  30 tablet  0   No current facility-administered medications for this visit.    OBJECTIVE: Middle-aged [place] American woman  in no acute distress Filed Vitals:   06/21/13 0937  BP: 131/69  Pulse: 86  Temp: 97.9 F (36.6 C)  Resp: 18     Body mass index is 21.78 kg/(m^2).    ECOG FS: 0 Filed Weights   06/21/13 0937  Weight: 139 lb 1.6 oz (63.095 kg)   Physical Exam: HEENT:  Sclerae anicteric.  Oropharynx clear and moist with no ulcerations or evidence of candidiasis. NODES:  No cervical or supraclavicular lymphadenopathy palpated.  BREAST EXAM: Deferred. Axillae are benign, no palpable lymphadenopathy. LUNGS:  Clear to auscultation bilaterally with good excursion.  No wheezes or rhonchi.   HEART:  Regular rate and rhythm. No murmur appreciated ABDOMEN:  Soft, nontender to palpation.  Positive, normoactive bowel sounds.  MSK:  No focal spinal tenderness to palpation. Full range of motion in the upper extremities.  EXTREMITIES:  No  peripheral edema.   SKIN:  Port is intact in the left upper chest wall, with no erythema or edema, no evidence of infection/cellulitis. The area is nontender to palpation. Hyperpigmentation on the hands and nailbeds bilaterally, but no drainage or evidence of infection. NEURO:  Nonfocal. Well oriented.  Appropriate affect.    LAB RESULTS:  Lab Results  Component Value Date   WBC 2.4* 06/21/2013   NEUTROABS 1.3* 06/21/2013   HGB 10.4* 06/21/2013   HCT 32.0* 06/21/2013   MCV 89.9 06/21/2013   PLT 323 06/21/2013      Chemistry      Component Value Date/Time   NA 139 06/07/2013 0910   NA 138 06/16/2011 0938   K 3.9 06/07/2013 0910   K 4.1 06/16/2011 0938   CL 110 06/16/2011 0938   CO2 24 06/07/2013 0910   CO2 26 06/16/2011 0938   BUN 7.5 06/07/2013 0910   BUN 10 06/16/2011 0938   CREATININE 0.7 06/07/2013 0910   CREATININE 0.81 06/16/2011 0938   CREATININE 0.74 07/26/2010 2332      Component Value Date/Time   CALCIUM 9.7 06/07/2013 0910   CALCIUM 9.5 06/16/2011 0938   ALKPHOS 91 06/07/2013 0910   ALKPHOS 78 06/16/2011 0938   AST 36* 06/07/2013 0910   AST 13 06/16/2011 0938   ALT 59* 06/07/2013 0910   ALT 10 06/16/2011 0938   BILITOT 0.33 06/07/2013 0910   BILITOT 0.5 06/16/2011 0938      STUDIES:  Echocardiogram on 03/08/2013 showed an ejection fraction of 55-60%.   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 Patient is undergoing neoadjuvant chemotherapy. Assess response to neoadjuvant therapy.  EXAM: MR BILATERAL BREAST WITHOUT AND WITH CONTRAST  TECHNIQUE: Multiplanar, multisequence MR images of both breasts were obtained prior to and following the intravenous administration of 13ml of MultiHance.  THREE-DIMENSIONAL MR IMAGE RENDERING ON INDEPENDENT WORKSTATION:  Three-dimensional MR images were rendered by post-processing of the  original MR data on an independent workstation. The three-dimensional MR images were interpreted, and findings are reported in the following complete MRI report for this study.  COMPARISON:  Previous exams  FINDINGS: Breast composition: c:  Heterogeneous fibroglandular tissue  Background parenchymal enhancement: Mild to moderate.  Right breast: The  previously described areas of clumped linear enhancement located within the central right breast which were shown to represent fibrocystic change on MR guided biopsies appear stable. There are no new enhancing foci within the right breast.  Left breast: The previously seen adjacent enhancing masses located within the superior left breast are no longer visible by MR and there is no abnormal enhancement in this region. There are no new enhancing foci.  Lymph nodes: No abnormal appearing lymph nodes.  Ancillary findings:  None.  IMPRESSION: Excellent response to neoadjuvant chemotherapy with no residual enhancing masses visible within the superior left breast. The study is otherwise unchanged.  RECOMMENDATION: Treatment plan.  BI-RADS CATEGORY  6: Known biopsy-proven malignancy - appropriate action should be taken.   Electronically Signed   By: Anselmo Pickler M.D.   On: 05/02/2013 15:46     ASSESSMENT: 52 y.o. Ana Young woman   (1)  status post left breast upper outer quadrant biopsy 02/22/2013 for a clinical T2 N0, stage IIA invasive ductal carcinoma, grade 3, triple negative, with an MIB-1 of 100%.    (2) MRI on 03/02/2013 showed an additional nodule at the 2:00 position, 5 mm lateral to the primary in the left breast, measuring 10 x 6 x 9 mm. This was biopsied on 03/11/2013 and was consistent with high-grade carcinoma [phone]). Prognostic panel is pending.   (3) MRI on 03/02/2013 also showed 2 areas of irregularity in the right breast which were worrisome for possible DCIS. Both of these areas were biopsied on 03/04/2013, and both showed fibrocystic  changes with no evidence of malignancy ([id number]).   (4) genetics testing is pending  (5)  treated in the neoadjuvant setting with   (a) 4 dose dense cycles of doxorubicin/ cyclophosphamide, with the first dose given on 03/15/2013 and the final one 04/26/2013, with a complete radiologic response  (b) followed by 12 weekly doses of carboplatin/paclitaxel started 05/17/2013, given in standard dose, with all neoadjuvant chemotherapy to be completed prior to definitive surgery.    PLAN: Alanni will proceed to treatment today as scheduled for her sixth weekly dose of carboplatin/paclitaxel. We will continue to see her on weekly basis with close followup, and she scheduled to see me again next week on December 23 for labs and physical exam prior to her seventh dose of carboplatin/paclitaxel. She's been scheduled out today through January 27 which will be her 12th and final weekly dose. Soon thereafter, she will have her repeat breast MRI and will be seeing both Dr. Darnelle Catalan and Dr. Donell Beers to discuss her definitive surgery.   I will mention that Emiliya tends to have a slightly reduced ANC, but we will continue to treat as long as her ANC is 1.0 or above. This will continue to be checked on a weekly basis.   Vanetta voices understanding and agreement with our plan as stated above. She will call prior to her appointment next week with any changes or problems.  Zollie Scale, PA-C   06/21/2013 10:34 AM

## 2013-06-21 NOTE — Patient Instructions (Signed)
Paclitaxel injection What is this medicine? PACLITAXEL (PAK li TAX el) is a chemotherapy drug. It targets fast dividing cells, like cancer cells, and causes these cells to die. This medicine is used to treat ovarian cancer, breast cancer, and other cancers. This medicine may be used for other purposes; ask your health care provider or pharmacist if you have questions. COMMON BRAND NAME(S): Onxol , Taxol What should I tell my health care provider before I take this medicine? They need to know if you have any of these conditions: -blood disorders -irregular heartbeat -infection (especially a virus infection such as chickenpox, cold sores, or herpes) -liver disease -previous or ongoing radiation therapy -an unusual or allergic reaction to paclitaxel, alcohol, polyoxyethylated castor oil, other chemotherapy agents, other medicines, foods, dyes, or preservatives -pregnant or trying to get pregnant -breast-feeding How should I use this medicine? This drug is given as an infusion into a vein. It is administered in a hospital or clinic by a specially trained health care professional. Talk to your pediatrician regarding the use of this medicine in children. Special care may be needed. Overdosage: If you think you have taken too much of this medicine contact a poison control center or emergency room at once. NOTE: This medicine is only for you. Do not share this medicine with others. What if I miss a dose? It is important not to miss your dose. Call your doctor or health care professional if you are unable to keep an appointment. What may interact with this medicine? Do not take this medicine with any of the following medications: -disulfiram -metronidazole This medicine may also interact with the following medications: -cyclosporine -diazepam -ketoconazole -medicines to increase blood counts like filgrastim, pegfilgrastim, sargramostim -other chemotherapy drugs like cisplatin, doxorubicin,  epirubicin, etoposide, teniposide, vincristine -quinidine -testosterone -vaccines -verapamil Talk to your doctor or health care professional before taking any of these medicines: -acetaminophen -aspirin -ibuprofen -ketoprofen -naproxen This list may not describe all possible interactions. Give your health care provider a list of all the medicines, herbs, non-prescription drugs, or dietary supplements you use. Also tell them if you smoke, drink alcohol, or use illegal drugs. Some items may interact with your medicine. What should I watch for while using this medicine? Your condition will be monitored carefully while you are receiving this medicine. You will need important blood work done while you are taking this medicine. This drug may make you feel generally unwell. This is not uncommon, as chemotherapy can affect healthy cells as well as cancer cells. Report any side effects. Continue your course of treatment even though you feel ill unless your doctor tells you to stop. In some cases, you may be given additional medicines to help with side effects. Follow all directions for their use. Call your doctor or health care professional for advice if you get a fever, chills or sore throat, or other symptoms of a cold or flu. Do not treat yourself. This drug decreases your body's ability to fight infections. Try to avoid being around people who are sick. This medicine may increase your risk to bruise or bleed. Call your doctor or health care professional if you notice any unusual bleeding. Be careful brushing and flossing your teeth or using a toothpick because you may get an infection or bleed more easily. If you have any dental work done, tell your dentist you are receiving this medicine. Avoid taking products that contain aspirin, acetaminophen, ibuprofen, naproxen, or ketoprofen unless instructed by your doctor. These medicines may hide a   fever. Do not become pregnant while taking this medicine.  Women should inform their doctor if they wish to become pregnant or think they might be pregnant. There is a potential for serious side effects to an unborn child. Talk to your health care professional or pharmacist for more information. Do not breast-feed an infant while taking this medicine. Men are advised not to father a child while receiving this medicine. What side effects may I notice from receiving this medicine? Side effects that you should report to your doctor or health care professional as soon as possible: -allergic reactions like skin rash, itching or hives, swelling of the face, lips, or tongue -low blood counts - This drug may decrease the number of white blood cells, red blood cells and platelets. You may be at increased risk for infections and bleeding. -signs of infection - fever or chills, cough, sore throat, pain or difficulty passing urine -signs of decreased platelets or bleeding - bruising, pinpoint red spots on the skin, black, tarry stools, nosebleeds -signs of decreased red blood cells - unusually weak or tired, fainting spells, lightheadedness -breathing problems -chest pain -high or low blood pressure -mouth sores -nausea and vomiting -pain, swelling, redness or irritation at the injection site -pain, tingling, numbness in the hands or feet -slow or irregular heartbeat -swelling of the ankle, feet, hands Side effects that usually do not require medical attention (report to your doctor or health care professional if they continue or are bothersome): -bone pain -complete hair loss including hair on your head, underarms, pubic hair, eyebrows, and eyelashes -changes in the color of fingernails -diarrhea -loosening of the fingernails -loss of appetite -muscle or joint pain -red flush to skin -sweating This list may not describe all possible side effects. Call your doctor for medical advice about side effects. You may report side effects to FDA at  1-800-FDA-1088. Where should I keep my medicine? This drug is given in a hospital or clinic and will not be stored at home. NOTE: This sheet is a summary. It may not cover all possible information. If you have questions about this medicine, talk to your doctor, pharmacist, or health care provider.  2014, [employer]. (2012-08-16 16:41:21) Carboplatin injection What is this medicine? CARBOPLATIN (KAR boe pla tin) is a chemotherapy drug. It targets fast dividing cells, like cancer cells, and causes these cells to die. This medicine is used to treat ovarian cancer and many other cancers. This medicine may be used for other purposes; ask your health care provider or pharmacist if you have questions. COMMON BRAND NAME(S): Paraplatin What should I tell my health care provider before I take this medicine? They need to know if you have any of these conditions: -blood disorders -hearing problems -kidney disease -recent or ongoing radiation therapy -an unusual or allergic reaction to carboplatin, cisplatin, other chemotherapy, other medicines, foods, dyes, or preservatives -pregnant or trying to get pregnant -breast-feeding How should I use this medicine? This drug is usually given as an infusion into a vein. It is administered in a hospital or clinic by a specially trained health care professional. Talk to your pediatrician regarding the use of this medicine in children. Special care may be needed. Overdosage: If you think you have taken too much of this medicine contact a poison control center or emergency room at once. NOTE: This medicine is only for you. Do not share this medicine with others. What if I miss a dose? It is important not to miss a dose. Call your   doctor or health care professional if you are unable to keep an appointment. What may interact with this medicine? -medicines for seizures -medicines to increase blood counts like filgrastim, pegfilgrastim,  sargramostim -some antibiotics like amikacin, gentamicin, neomycin, streptomycin, tobramycin -vaccines Talk to your doctor or health care professional before taking any of these medicines: -acetaminophen -aspirin -ibuprofen -ketoprofen -naproxen This list may not describe all possible interactions. Give your health care provider a list of all the medicines, herbs, non-prescription drugs, or dietary supplements you use. Also tell them if you smoke, drink alcohol, or use illegal drugs. Some items may interact with your medicine. What should I watch for while using this medicine? Your condition will be monitored carefully while you are receiving this medicine. You will need important blood work done while you are taking this medicine. This drug may make you feel generally unwell. This is not uncommon, as chemotherapy can affect healthy cells as well as cancer cells. Report any side effects. Continue your course of treatment even though you feel ill unless your doctor tells you to stop. In some cases, you may be given additional medicines to help with side effects. Follow all directions for their use. Call your doctor or health care professional for advice if you get a fever, chills or sore throat, or other symptoms of a cold or flu. Do not treat yourself. This drug decreases your body's ability to fight infections. Try to avoid being around people who are sick. This medicine may increase your risk to bruise or bleed. Call your doctor or health care professional if you notice any unusual bleeding. Be careful brushing and flossing your teeth or using a toothpick because you may get an infection or bleed more easily. If you have any dental work done, tell your dentist you are receiving this medicine. Avoid taking products that contain aspirin, acetaminophen, ibuprofen, naproxen, or ketoprofen unless instructed by your doctor. These medicines may hide a fever. Do not become pregnant while taking this  medicine. Women should inform their doctor if they wish to become pregnant or think they might be pregnant. There is a potential for serious side effects to an unborn child. Talk to your health care professional or pharmacist for more information. Do not breast-feed an infant while taking this medicine. What side effects may I notice from receiving this medicine? Side effects that you should report to your doctor or health care professional as soon as possible: -allergic reactions like skin rash, itching or hives, swelling of the face, lips, or tongue -signs of infection - fever or chills, cough, sore throat, pain or difficulty passing urine -signs of decreased platelets or bleeding - bruising, pinpoint red spots on the skin, black, tarry stools, nosebleeds -signs of decreased red blood cells - unusually weak or tired, fainting spells, lightheadedness -breathing problems -changes in hearing -changes in vision -chest pain -high blood pressure -low blood counts - This drug may decrease the number of white blood cells, red blood cells and platelets. You may be at increased risk for infections and bleeding. -nausea and vomiting -pain, swelling, redness or irritation at the injection site -pain, tingling, numbness in the hands or feet -problems with balance, talking, walking -trouble passing urine or change in the amount of urine Side effects that usually do not require medical attention (report to your doctor or health care professional if they continue or are bothersome): -hair loss -loss of appetite -metallic taste in the mouth or changes in taste This list may not describe all   possible side effects. Call your doctor for medical advice about side effects. You may report side effects to FDA at 1-800-FDA-1088. Where should I keep my medicine? This drug is given in a hospital or clinic and will not be stored at home. NOTE: This sheet is a summary. It may not cover all possible information. If you  have questions about this medicine, talk to your doctor, pharmacist, or health care provider.  2014, [employer]. (2007-09-28 14:38:05)  

## 2013-06-21 NOTE — Progress Notes (Signed)
Per Dr. Darnelle Catalan, okay to treat today with ANC-1.3 and WBC-2.4

## 2013-06-23 ENCOUNTER — Encounter: Payer: Self-pay | Admitting: Physician Assistant

## 2013-06-28 ENCOUNTER — Ambulatory Visit (HOSPITAL_BASED_OUTPATIENT_CLINIC_OR_DEPARTMENT_OTHER): Payer: BC Managed Care – PPO | Admitting: Physician Assistant

## 2013-06-28 ENCOUNTER — Other Ambulatory Visit (HOSPITAL_BASED_OUTPATIENT_CLINIC_OR_DEPARTMENT_OTHER): Payer: BC Managed Care – PPO

## 2013-06-28 ENCOUNTER — Encounter: Payer: Self-pay | Admitting: Physician Assistant

## 2013-06-28 ENCOUNTER — Other Ambulatory Visit: Payer: Self-pay | Admitting: Physician Assistant

## 2013-06-28 ENCOUNTER — Ambulatory Visit (HOSPITAL_BASED_OUTPATIENT_CLINIC_OR_DEPARTMENT_OTHER): Payer: BC Managed Care – PPO

## 2013-06-28 VITALS — BP 135/81 | HR 78 | Temp 98.1°F | Resp 18 | Ht 67.0 in | Wt 139.6 lb

## 2013-06-28 DIAGNOSIS — C50412 Malignant neoplasm of upper-outer quadrant of left female breast: Secondary | ICD-10-CM

## 2013-06-28 DIAGNOSIS — C50419 Malignant neoplasm of upper-outer quadrant of unspecified female breast: Secondary | ICD-10-CM

## 2013-06-28 DIAGNOSIS — Z171 Estrogen receptor negative status [ER-]: Secondary | ICD-10-CM

## 2013-06-28 DIAGNOSIS — D649 Anemia, unspecified: Secondary | ICD-10-CM

## 2013-06-28 DIAGNOSIS — F411 Generalized anxiety disorder: Secondary | ICD-10-CM

## 2013-06-28 DIAGNOSIS — Z5111 Encounter for antineoplastic chemotherapy: Secondary | ICD-10-CM

## 2013-06-28 LAB — CBC WITH DIFFERENTIAL/PLATELET
BASO%: 0.9 % (ref 0.0–2.0)
EOS%: 0 % (ref 0.0–7.0)
LYMPH%: 36 % (ref 14.0–49.7)
MCH: 29.5 pg (ref 25.1–34.0)
MCHC: 32.9 g/dL (ref 31.5–36.0)
MONO#: 0.2 10*3/uL (ref 0.1–0.9)
NEUT#: 1.2 10*3/uL — ABNORMAL LOW (ref 1.5–6.5)
NEUT%: 55.1 % (ref 38.4–76.8)
Platelets: 293 10*3/uL (ref 145–400)
RBC: 3.49 10*6/uL — ABNORMAL LOW (ref 3.70–5.45)
WBC: 2.3 10*3/uL — ABNORMAL LOW (ref 3.9–10.3)
lymph#: 0.8 10*3/uL — ABNORMAL LOW (ref 0.9–3.3)

## 2013-06-28 MED ORDER — DIPHENHYDRAMINE HCL 50 MG/ML IJ SOLN
INTRAMUSCULAR | Status: AC
  Filled 2013-06-28: qty 1

## 2013-06-28 MED ORDER — DIPHENHYDRAMINE HCL 50 MG/ML IJ SOLN
50.0000 mg | Freq: Once | INTRAMUSCULAR | Status: AC
  Administered 2013-06-28: 50 mg via INTRAVENOUS

## 2013-06-28 MED ORDER — SODIUM CHLORIDE 0.9 % IV SOLN
Freq: Once | INTRAVENOUS | Status: AC
  Administered 2013-06-28: 11:00:00 via INTRAVENOUS

## 2013-06-28 MED ORDER — ONDANSETRON 16 MG/50ML IVPB (CHCC)
16.0000 mg | Freq: Once | INTRAVENOUS | Status: AC
  Administered 2013-06-28: 16 mg via INTRAVENOUS

## 2013-06-28 MED ORDER — FAMOTIDINE IN NACL 20-0.9 MG/50ML-% IV SOLN
20.0000 mg | Freq: Once | INTRAVENOUS | Status: AC
  Administered 2013-06-28: 20 mg via INTRAVENOUS

## 2013-06-28 MED ORDER — SODIUM CHLORIDE 0.9 % IJ SOLN
10.0000 mL | INTRAMUSCULAR | Status: DC | PRN
  Administered 2013-06-28: 10 mL
  Filled 2013-06-28: qty 10

## 2013-06-28 MED ORDER — SODIUM CHLORIDE 0.9 % IV SOLN
250.6000 mg | Freq: Once | INTRAVENOUS | Status: AC
  Administered 2013-06-28: 250 mg via INTRAVENOUS
  Filled 2013-06-28: qty 25

## 2013-06-28 MED ORDER — SODIUM CHLORIDE 0.9 % IV SOLN
80.0000 mg/m2 | Freq: Once | INTRAVENOUS | Status: AC
  Administered 2013-06-28: 138 mg via INTRAVENOUS
  Filled 2013-06-28: qty 23

## 2013-06-28 MED ORDER — DEXAMETHASONE SODIUM PHOSPHATE 20 MG/5ML IJ SOLN
20.0000 mg | Freq: Once | INTRAMUSCULAR | Status: AC
  Administered 2013-06-28: 20 mg via INTRAVENOUS

## 2013-06-28 MED ORDER — LORAZEPAM 0.5 MG PO TABS: 0.5000 mg | ORAL_TABLET | Freq: Every evening | ORAL | Status: DC | PRN

## 2013-06-28 MED ORDER — FAMOTIDINE IN NACL 20-0.9 MG/50ML-% IV SOLN
INTRAVENOUS | Status: AC
  Filled 2013-06-28: qty 50

## 2013-06-28 MED ORDER — ONDANSETRON 16 MG/50ML IVPB (CHCC)
INTRAVENOUS | Status: AC
  Filled 2013-06-28: qty 16

## 2013-06-28 MED ORDER — HEPARIN SOD (PORK) LOCK FLUSH 100 UNIT/ML IV SOLN
500.0000 [IU] | Freq: Once | INTRAVENOUS | Status: AC | PRN
  Administered 2013-06-28: 500 [IU]
  Filled 2013-06-28: qty 5

## 2013-06-28 MED ORDER — PROCHLORPERAZINE MALEATE 10 MG PO TABS: 10.0000 mg | ORAL_TABLET | Freq: Four times a day (QID) | ORAL | Status: DC | PRN

## 2013-06-28 MED ORDER — DEXAMETHASONE SODIUM PHOSPHATE 20 MG/5ML IJ SOLN
INTRAMUSCULAR | Status: AC
  Filled 2013-06-28: qty 5

## 2013-06-28 NOTE — Progress Notes (Signed)
Okay to treat today per Dr. Darnelle Catalan with ANC 1.2 and WBC 2.3. Angelena Form, RN

## 2013-06-28 NOTE — Patient Instructions (Signed)
[facility] Discharge Instructions for Patients Receiving Chemotherapy  Today you received the following chemotherapy agents: Taxol, Carboplatin  To help prevent nausea and vomiting after your treatment, we encourage you to take your nausea medication as prescribed.    If you develop nausea and vomiting that is not controlled by your nausea medication, call the clinic.   BELOW ARE SYMPTOMS THAT SHOULD BE REPORTED IMMEDIATELY:  *FEVER GREATER THAN 100.5 F  *CHILLS WITH OR WITHOUT FEVER  NAUSEA AND VOMITING THAT IS NOT CONTROLLED WITH YOUR NAUSEA MEDICATION  *UNUSUAL SHORTNESS OF BREATH  *UNUSUAL BRUISING OR BLEEDING  TENDERNESS IN MOUTH AND THROAT WITH OR WITHOUT PRESENCE OF ULCERS  *URINARY PROBLEMS  *BOWEL PROBLEMS  UNUSUAL RASH Items with * indicate a potential emergency and should be followed up as soon as possible.  Feel free to call the clinic you have any questions or concerns. The clinic phone number is [phone].    

## 2013-06-28 NOTE — Progress Notes (Signed)
ID: [name] OB: Apr 13, 1961  MR#: [id number]  CSN#:[id number]  PCP: Maryjean Ka, MD GYN:   SU: Almond Lint OTHER MD: Lurline Hare, Paula Compton  CHIEF COMPLAINT:  Left  Breast Cancer/Neoadjuvant Chemotherapy  HISTORY OF PRESENT ILLNESS: Ana Young herself palpated a mass in her left breast 01/15/2013 and brought it to the attention of Dr. Mauricio Po, who arranged for her to have bilateral digital mammography at the breast Center 01/27/2013. The patient does have heterogeneously dense breasts. In the outer upper left breast there was an oval mass by mammography measuring approximately 1.5 cm, which by palpation was firm and measured approximately 1 cm. Ultrasound showed this to be hypoechoic and to measure 1.4 cm.  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  The patient then underwent bilateral breast MRI 02/28/2013. In the left breast upper-outer quadrant the mass in question measured 2.9 cm. There was a second mass lateral to it measuring 1.0 cm. There were no additional abnormal appearing lymph nodes and no evidence of internal mammary adenopathy.  In the right breast there were 2 areas of irregular enhancement in the superior central third of the breast, both measuring 9 mm. These were felt to be worrisome for a ductal carcinoma in situ.  The patient's case was discussed at the multidisciplinary breast cancer conference 03/02/2013. Her subsequent history is as detailed below.  INTERVAL HISTORY: Ana Young returns today accompanied by her husband [name] for followup of her locally advanced left breast carcinoma. She is due for her 7th of 12 planned weekly doses of neoadjuvant carboplatin/paclitaxel today which she continues to tolerate well.  [name] is feeling well with no new complaints today. She tells me her energy level is "actually pretty  good". She still has some blurred vision when she takes the steroids. She has a little bit of a running nose, and occasionally see specks of blood in the mucus when she blows her nose. She's had no spontaneous epistasis. She's had no mouth ulcers or oral sensitivity. Her throat feels a little scratchy today. No fevers or chills.   REVIEW OF SYSTEMS: Ana Young has had no rashes or skin changes, but continues to have some no dyscrasia, with some loosening of the nails from the nailbeds bilaterally. She's had no drainage or evidence of infection.  She's had no signs of abnormal bruising or bleeding. She's eating well, and denies any nausea or change in bowel or bladder habits. She's had no cough, phlegm production, increased shortness of breath, or chest pain.   She's had no abnormal headaches or dizziness. She denies any current myalgias, arthralgias, or bony pain, and has had no peripheral neuropathy or peripheral swelling.   A detailed review of systems is otherwise stable and noncontributory.    PAST MEDICAL HISTORY: Past Medical History  Diagnosis Date  . Depression   . Vitamin D deficiency   . Anxiety   . Seasonal allergies   . HTN (hypertension)   . Insomnia   . History of migraines   . Cancer     Left breast cancer  . Breast cancer   . Anemia 06/07/2013    PAST SURGICAL HISTORY: Past Surgical History  Procedure Laterality Date  . Bunionectomy Left 2009  . Portacath placement Left 03/09/2013    Procedure: INSERTION PORT-A-CATH;  Surgeon: Almond Lint, MD;  Location: [facility];  Service: General;  Laterality: Left;  FAMILY HISTORY Family History  Problem Relation Age of Onset  . Heart attack Brother   . Congestive Heart Failure Mother   . Stroke Father   . Diabetes Father   . Breast cancer Maternal Grandmother 71   the patient's parents are in their 50s. The patient has one brother, no sisters.50 The patient's maternal grandmother was diagnosed with breast cancer  the age of 50. There is no other history of breast cancer in the family to the patient's knowledge, and no history of ovarian cancer  GYNECOLOGIC HISTORY:  Menarche age 52, first live birth age 52, the patient is GX P2. Her periods are not irregular, but still every 2-3 months, and otherwise unremarkable.  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 Her husband [name] works as a Actuary. Son Ana Young lives in [place] and works in Engineering geologist. Daughter Ana Young is 50 year old and at home. The patient has no grandchildren. She attends the [employer]    ADVANCED DIRECTIVES: Not in place  HEALTH MAINTENANCE:  (Updated December 2014) History  Substance Use Topics  . Smoking status: Never Smoker   . Smokeless tobacco: Never Used  . Alcohol Use: No     Colonoscopy: - Never  PAP: Feb 2012, Dr. Jeanice Lim  Bone density: - Never  Lipid panel:  Dec 2012, Dr. Ashley Royalty   No Known Allergies  Current Outpatient Prescriptions  Medication Sig Dispense Refill  . ALPRAZolam (XANAX) 1 MG tablet Take 1 mg by mouth 2 (two) times daily as needed for sleep (1/2 tablet bid as needed).      Marland Kitchen buPROPion (WELLBUTRIN XL) 300 MG 24 hr tablet TAKE 1 TABLET (300 MG TOTAL) BY MOUTH EVERY MORNING.  30 tablet  3  . docusate sodium (COLACE) 100 MG capsule Take 1-2 capsules (100-200 mg total) by mouth every morning. As directed for constipation  30 capsule  1  . ibuprofen (ADVIL,MOTRIN) 800 MG tablet TAKE 1 TABLET (800 MG TOTAL) BY MOUTH EVERY 8 (EIGHT) HOURS AS NEEDED FOR PAIN.  30 tablet  2  . lidocaine-prilocaine (EMLA) cream Apply topically as needed. For chemotherapy  30 g  0  . LORazepam (ATIVAN) 0.5 MG tablet Take 1 tablet (0.5 mg total) by mouth at bedtime as needed for anxiety (Nausea or vomiting).  30 tablet  0  . ondansetron (ZOFRAN) 8 MG tablet Take 1 tablet (8 mg total) by  mouth 2 (two) times daily. 1 tab by mouth twice daily x 2 days after chemo, then 1 tab every 12 hrs PRN nausea  30 tablet  1  . polyethylene glycol powder (MIRALAX) powder Take 17 g by mouth daily as needed (constipation).  255 g  1  . prochlorperazine (COMPAZINE) 10 MG tablet Take 1 tablet (10 mg total) by mouth every 6 (six) hours as needed for nausea or vomiting.  30 tablet  1  . propranolol ER (INDERAL LA) 120 MG 24 hr capsule TAKE 1 CAPSULE (120 MG TOTAL) BY MOUTH DAILY.  93 capsule  1  . zolpidem (AMBIEN) 5 MG tablet TAKE 1 TABLET BY MOUTH AT BEDTIME AS NEEDED FOR SLEEP  30 tablet  0   No current facility-administered medications for this visit.   Facility-Administered Medications Ordered in Other Visits  Medication Dose Route Frequency Provider Last Rate Last Dose  . sodium chloride 0.9 % injection 10 mL  10 mL Intracatheter PRN  Lowella Dell, MD   10 mL at 06/21/13 1322    OBJECTIVE: Middle-aged [place] American woman  in no acute distress Filed Vitals:   06/28/13 0933  BP: 135/81  Pulse: 78  Temp: 98.1 F (36.7 C)  Resp: 18     Body mass index is 21.86 kg/(m^2).    ECOG FS: 0 Filed Weights   06/28/13 0933  Weight: 139 lb 9.6 oz (63.322 kg)   Physical Exam: HEENT:  Sclerae anicteric.  Oropharynx clear and moist. No ulcerations or evidence of candidiasis. No pharyngeal erythema or exudate. Neck is supple. NODES:  No cervical or supraclavicular lymphadenopathy palpated.  BREAST EXAM: Deferred. Axillae are benign, no palpable lymphadenopathy. LUNGS:  Clear to auscultation bilaterally.  No wheezes or rhonchi.   HEART:  Regular rate and rhythm. No murmur appreciated ABDOMEN:  Soft, nontender to palpation.  Positive, normoactive bowel sounds.  MSK:  No focal spinal tenderness to palpation. Full range of motion in the upper extremities.  EXTREMITIES:  No peripheral edema.   SKIN:  Port is intact in the left upper chest wall, with no erythema or edema, no evidence of  infection/cellulitis. Hyperpigmentation of the skin on the hands and nailbeds bilaterally, but no visible drainage or evidence of infection. NEURO:  Nonfocal. Well oriented.  Appropriate affect.    LAB RESULTS:  Lab Results  Component Value Date   WBC 2.3* 06/28/2013   NEUTROABS 1.2* 06/28/2013   HGB 10.3* 06/28/2013   HCT 31.3* 06/28/2013   MCV 89.7 06/28/2013   PLT 293 06/28/2013      Chemistry      Component Value Date/Time   NA 140 06/21/2013 0929   NA 138 06/16/2011 0938   K 4.3 06/21/2013 0929   K 4.1 06/16/2011 0938   CL 110 06/16/2011 0938   CO2 26 06/21/2013 0929   CO2 26 06/16/2011 0938   BUN 12.4 06/21/2013 0929   BUN 10 06/16/2011 0938   CREATININE 0.7 06/21/2013 0929   CREATININE 0.81 06/16/2011 0938   CREATININE 0.74 07/26/2010 2332      Component Value Date/Time   CALCIUM 10.1 06/21/2013 0929   CALCIUM 9.5 06/16/2011 0938   ALKPHOS 102 06/21/2013 0929   ALKPHOS 78 06/16/2011 0938   AST 19 06/21/2013 0929   AST 13 06/16/2011 0938   ALT 24 06/21/2013 0929   ALT 10 06/16/2011 0938   BILITOT 0.32 06/21/2013 0929   BILITOT 0.5 06/16/2011 0938      STUDIES:  Echocardiogram on 03/08/2013 showed an ejection fraction of 55-60%.   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 Patient is undergoing neoadjuvant chemotherapy. Assess response to neoadjuvant therapy.  EXAM: MR BILATERAL BREAST WITHOUT AND WITH CONTRAST  TECHNIQUE: Multiplanar, multisequence MR images of both breasts were obtained prior to and following the intravenous administration of 13ml of MultiHance.  THREE-DIMENSIONAL MR IMAGE RENDERING ON INDEPENDENT WORKSTATION:  Three-dimensional MR images were rendered by post-processing of the original MR data on an independent workstation. The three-dimensional MR images were interpreted, and findings are  reported in the following complete MRI report for this study.  COMPARISON:  Previous exams  FINDINGS: Breast composition: c:  Heterogeneous fibroglandular tissue  Background parenchymal enhancement: Mild to moderate.  Right breast: The previously described areas of clumped linear enhancement located within the central right breast which were shown to represent fibrocystic change  on MR guided biopsies appear stable. There are no new enhancing foci within the right breast.  Left breast: The previously seen adjacent enhancing masses located within the superior left breast are no longer visible by MR and there is no abnormal enhancement in this region. There are no new enhancing foci.  Lymph nodes: No abnormal appearing lymph nodes.  Ancillary findings:  None.  IMPRESSION: Excellent response to neoadjuvant chemotherapy with no residual enhancing masses visible within the superior left breast. The study is otherwise unchanged.  RECOMMENDATION: Treatment plan.  BI-RADS CATEGORY  6: Known biopsy-proven malignancy - appropriate action should be taken.   Electronically Signed   By: Anselmo Pickler M.D.   On: 05/02/2013 15:46     ASSESSMENT: 52 y.o. Adline Peals woman   (1)  status post left breast upper outer quadrant biopsy 02/22/2013 for a clinical T2 N0, stage IIA invasive ductal carcinoma, grade 3, triple negative, with an MIB-1 of 100%.    (2) MRI on 03/02/2013 showed an additional nodule at the 2:00 position, 5 mm lateral to the primary in the left breast, measuring 10 x 6 x 9 mm. This was biopsied on 03/11/2013 and was consistent with high-grade carcinoma [id number]). Prognostic panel is pending.   (3) MRI on 03/02/2013 also showed 2 areas of irregularity in the right breast which were worrisome for possible DCIS. Both of these areas were biopsied on 03/04/2013, and both showed fibrocystic changes with no evidence of malignancy ([id number]).   (4) genetics testing is pending  (5)  treated in the  neoadjuvant setting with   (a) 4 dose dense cycles of doxorubicin/ cyclophosphamide, with the first dose given on 03/15/2013 and the final one 04/26/2013, with a complete radiologic response  (b) followed by 12 weekly doses of carboplatin/paclitaxel started 05/17/2013, given in standard dose, with all neoadjuvant chemotherapy to be completed prior to definitive surgery.    PLAN: Analea will proceed to treatment today as scheduled for her 7th weekly dose of carboplatin/paclitaxel. We will continue to see her on weekly basis with close followup, and she is already scheduled to see me again next week on December 30 in anticipation of her eighth dose. She scheduled out all the way through late January, completing 12 weekly doses of this regimen. Soon thereafter, she will have a repeat breast MRI and will be seeing both Dr. Darnelle Catalan and Dr. Donell Beers to discuss her definitive surgery.   I will mention that Konica tends to have a slightly reduced ANC, but we will continue to treat as long as her ANC is 1.0 or above. This will continue to be checked on a weekly basis, and I have also alerted our pharmacy with regards to these guidelines.Stanton Kidney voices understanding and agreement with our plan as stated above. She will call prior to her appointment next week with any changes or problems.  Zollie Scale, PA-C   06/28/2013 9:53 AM

## 2013-07-05 ENCOUNTER — Other Ambulatory Visit (HOSPITAL_BASED_OUTPATIENT_CLINIC_OR_DEPARTMENT_OTHER): Payer: BC Managed Care – PPO

## 2013-07-05 ENCOUNTER — Other Ambulatory Visit: Payer: Self-pay | Admitting: *Deleted

## 2013-07-05 ENCOUNTER — Ambulatory Visit (HOSPITAL_BASED_OUTPATIENT_CLINIC_OR_DEPARTMENT_OTHER): Payer: BC Managed Care – PPO

## 2013-07-05 ENCOUNTER — Ambulatory Visit (HOSPITAL_BASED_OUTPATIENT_CLINIC_OR_DEPARTMENT_OTHER): Payer: BC Managed Care – PPO | Admitting: Physician Assistant

## 2013-07-05 ENCOUNTER — Encounter: Payer: Self-pay | Admitting: Physician Assistant

## 2013-07-05 VITALS — BP 135/78 | HR 74 | Temp 97.8°F | Resp 18 | Ht 67.0 in | Wt 139.2 lb

## 2013-07-05 DIAGNOSIS — D709 Neutropenia, unspecified: Secondary | ICD-10-CM

## 2013-07-05 DIAGNOSIS — C50419 Malignant neoplasm of upper-outer quadrant of unspecified female breast: Secondary | ICD-10-CM

## 2013-07-05 DIAGNOSIS — Z5111 Encounter for antineoplastic chemotherapy: Secondary | ICD-10-CM

## 2013-07-05 DIAGNOSIS — C50412 Malignant neoplasm of upper-outer quadrant of left female breast: Secondary | ICD-10-CM

## 2013-07-05 DIAGNOSIS — Z171 Estrogen receptor negative status [ER-]: Secondary | ICD-10-CM

## 2013-07-05 DIAGNOSIS — D649 Anemia, unspecified: Secondary | ICD-10-CM

## 2013-07-05 LAB — COMPREHENSIVE METABOLIC PANEL (CC13)
Albumin: 3.7 g/dL (ref 3.5–5.0)
Alkaline Phosphatase: 91 U/L (ref 40–150)
BUN: 12.7 mg/dL (ref 7.0–26.0)
Calcium: 9.9 mg/dL (ref 8.4–10.4)
Chloride: 106 mEq/L (ref 98–109)
Glucose: 96 mg/dl (ref 70–140)
Potassium: 4 mEq/L (ref 3.5–5.1)
Sodium: 138 mEq/L (ref 136–145)
Total Bilirubin: 0.26 mg/dL (ref 0.20–1.20)
Total Protein: 6.9 g/dL (ref 6.4–8.3)

## 2013-07-05 LAB — CBC WITH DIFFERENTIAL/PLATELET
Basophils Absolute: 0 10*3/uL (ref 0.0–0.1)
EOS%: 0.5 % (ref 0.0–7.0)
Eosinophils Absolute: 0 10*3/uL (ref 0.0–0.5)
HCT: 30.7 % — ABNORMAL LOW (ref 34.8–46.6)
HGB: 10.2 g/dL — ABNORMAL LOW (ref 11.6–15.9)
MCH: 30.1 pg (ref 25.1–34.0)
MONO#: 0.2 10*3/uL (ref 0.1–0.9)
MONO%: 8.6 % (ref 0.0–14.0)
NEUT#: 1.2 10*3/uL — ABNORMAL LOW (ref 1.5–6.5)
NEUT%: 52.6 % (ref 38.4–76.8)
RDW: 17.1 % — ABNORMAL HIGH (ref 11.2–14.5)
WBC: 2.2 10*3/uL — ABNORMAL LOW (ref 3.9–10.3)
lymph#: 0.8 10*3/uL — ABNORMAL LOW (ref 0.9–3.3)

## 2013-07-05 MED ORDER — HEPARIN SOD (PORK) LOCK FLUSH 100 UNIT/ML IV SOLN
500.0000 [IU] | Freq: Once | INTRAVENOUS | Status: AC | PRN
  Administered 2013-07-05: 500 [IU]
  Filled 2013-07-05: qty 5

## 2013-07-05 MED ORDER — FAMOTIDINE IN NACL 20-0.9 MG/50ML-% IV SOLN
INTRAVENOUS | Status: AC
  Filled 2013-07-05: qty 50

## 2013-07-05 MED ORDER — SODIUM CHLORIDE 0.9 % IV SOLN
Freq: Once | INTRAVENOUS | Status: AC
  Administered 2013-07-05: 10:00:00 via INTRAVENOUS

## 2013-07-05 MED ORDER — DEXAMETHASONE SODIUM PHOSPHATE 20 MG/5ML IJ SOLN
20.0000 mg | Freq: Once | INTRAMUSCULAR | Status: AC
  Administered 2013-07-05: 20 mg via INTRAVENOUS

## 2013-07-05 MED ORDER — DIPHENHYDRAMINE HCL 50 MG/ML IJ SOLN
INTRAMUSCULAR | Status: AC
  Filled 2013-07-05: qty 1

## 2013-07-05 MED ORDER — DEXAMETHASONE SODIUM PHOSPHATE 20 MG/5ML IJ SOLN
INTRAMUSCULAR | Status: AC
  Filled 2013-07-05: qty 5

## 2013-07-05 MED ORDER — SODIUM CHLORIDE 0.9 % IJ SOLN
10.0000 mL | INTRAMUSCULAR | Status: DC | PRN
  Administered 2013-07-05: 10 mL
  Filled 2013-07-05: qty 10

## 2013-07-05 MED ORDER — SODIUM CHLORIDE 0.9 % IV SOLN
80.0000 mg/m2 | Freq: Once | INTRAVENOUS | Status: AC
  Administered 2013-07-05: 138 mg via INTRAVENOUS
  Filled 2013-07-05: qty 23

## 2013-07-05 MED ORDER — SODIUM CHLORIDE 0.9 % IV SOLN
250.6000 mg | Freq: Once | INTRAVENOUS | Status: AC
  Administered 2013-07-05: 250 mg via INTRAVENOUS
  Filled 2013-07-05: qty 25

## 2013-07-05 MED ORDER — ONDANSETRON 16 MG/50ML IVPB (CHCC)
INTRAVENOUS | Status: AC
  Filled 2013-07-05: qty 16

## 2013-07-05 MED ORDER — ONDANSETRON 16 MG/50ML IVPB (CHCC)
16.0000 mg | Freq: Once | INTRAVENOUS | Status: AC
  Administered 2013-07-05: 16 mg via INTRAVENOUS

## 2013-07-05 MED ORDER — DIPHENHYDRAMINE HCL 50 MG/ML IJ SOLN
50.0000 mg | Freq: Once | INTRAMUSCULAR | Status: AC
  Administered 2013-07-05: 50 mg via INTRAVENOUS

## 2013-07-05 MED ORDER — FAMOTIDINE IN NACL 20-0.9 MG/50ML-% IV SOLN
20.0000 mg | Freq: Once | INTRAVENOUS | Status: AC
  Administered 2013-07-05: 20 mg via INTRAVENOUS

## 2013-07-05 NOTE — Progress Notes (Signed)
ID: [name] OB: 31-Jan-1961  MR#: [id number]  CSN#:[id number]  PCP: Maryjean Ka, MD GYN:   SU: Almond Lint OTHER MD: Lurline Hare, Paula Compton  CHIEF COMPLAINT:  Left  Breast Cancer/Neoadjuvant Chemotherapy  HISTORY OF PRESENT ILLNESS: Ana Young palpated a mass in her left breast 01/15/2013 and brought it to the attention of Dr. Mauricio Po, who arranged for her to have bilateral digital mammography at the breast Center 01/27/2013. The patient does have heterogeneously dense breasts. In the outer upper left breast there was an oval mass by mammography measuring approximately 1.5 cm, which by palpation was firm and measured approximately 1 cm. Ultrasound showed this to be hypoechoic and to measure 1.4 cm.  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  The patient then underwent bilateral breast MRI 02/28/2013. In the left breast upper-outer quadrant the mass in question measured 2.9 cm. There was a second mass lateral to it measuring 1.0 cm. There were no additional abnormal appearing lymph nodes and no evidence of internal mammary adenopathy.  In the right breast there were 2 areas of irregular enhancement in the superior central third of the breast, both measuring 9 mm. These were felt to be worrisome for a ductal carcinoma in situ.  The patient's case was discussed at the multidisciplinary breast cancer conference 03/02/2013. Her subsequent history is as detailed below.  INTERVAL HISTORY: Ana Young returns today accompanied by her husband [name] for followup of her locally advanced left breast carcinoma. Ana Young is due for her 8th of 12 planned weekly doses of neoadjuvant carboplatin/paclitaxel today which Ana Young continues to tolerate well.  The family had a good Christmas together. Physically, Ana Young tells me Ana Young is feeling well, but that this  is beginning to wear on her mentally and emotionally. Ana Young feels fatigued and "down".    REVIEW OF SYSTEMS: Otherwise, Shane has had no fevers, chills, or night sweats and denies any rashes.  Ana Young continues to have hyperpigmentation of the hands bilaterally, including the nailbeds. There is some slight loosening of the nails from the nailbed, but no drainage or evidence of infection. They're only slightly tender. Ana Young's had only some slight numbness in the very tips of her fingers, but nothing that is affecting her day-to-day activity. No neuropathy in the lower extremities. Ana Young's eating well, and denies anychange in bowel or bladder habits. Ana Young had an episode of nausea yesterday, but took her antinausea medication and had no emesis. This has now resolved. Ana Young's had a little bit of a runny nose, and occasionally has some blood in the mucus. Ana Young's had no cough, phlegm production, increased shortness of breath, or chest pain.   Ana Young's had no abnormal headaches or dizziness. Her vision is slightly blurred, but this is stable. Ana Young denies any current myalgias, arthralgias, or bony pain, and has had no peripheral swelling.   A detailed review of systems is otherwise stable and noncontributory.    PAST MEDICAL HISTORY: Past Medical History  Diagnosis Date  . Depression   . Vitamin D deficiency   . Anxiety   . Seasonal allergies   . HTN (hypertension)   . Insomnia   . History of migraines   . Cancer     Left breast cancer  . Breast cancer   . Anemia 06/07/2013    PAST SURGICAL HISTORY: Past Surgical History  Procedure Laterality Date  . Bunionectomy Left 2009  .  Portacath placement Left 03/09/2013    Procedure: INSERTION PORT-A-CATH;  Surgeon: Almond Lint, MD;  Location: [facility];  Service: General;  Laterality: Left;    FAMILY HISTORY Family History  Problem Relation Age of Onset  . Heart attack Brother   . Congestive Heart Failure Mother   . Stroke Father   . Diabetes  Father   . Breast cancer Maternal Grandmother 41   the patient's parents are in their 65s. The patient has one brother, no sisters. The patient's maternal grandmother was diagnosed with breast cancer the age of 68. There is no other history of breast cancer in the family to the patient's knowledge, and no history of ovarian cancer  GYNECOLOGIC HISTORY:  Menarche age 5, first live birth age 38, the patient is GX P2. Her periods are not irregular, but still every 2-3 months, and otherwise unremarkable.  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 Her husband [name] works as a Actuary. Son [name] lives in [place] and works in Engineering geologist. Daughter Arlena Marsan is 51 year old and at home. The patient has no grandchildren. Ana Young attends the [employer]    ADVANCED DIRECTIVES: Not in place  HEALTH MAINTENANCE:  (Updated December 2014) History  Substance Use Topics  . Smoking status: Never Smoker   . Smokeless tobacco: Never Used  . Alcohol Use: No     Colonoscopy: - Never  PAP: Feb 2012, Dr. Jeanice Lim  Bone density: - Never  Lipid panel:  Dec 2012, Dr. Ashley Royalty   No Known Allergies  Current Outpatient Prescriptions  Medication Sig Dispense Refill  . ALPRAZolam (XANAX) 1 MG tablet Take 1 mg by mouth 2 (two) times daily as needed for sleep (1/2 tablet bid as needed).      Marland Kitchen buPROPion (WELLBUTRIN XL) 300 MG 24 hr tablet TAKE 1 TABLET (300 MG TOTAL) BY MOUTH EVERY MORNING.  30 tablet  3  . docusate sodium (COLACE) 100 MG capsule Take 1-2 capsules (100-200 mg total) by mouth every morning. As directed for constipation  30 capsule  1  . ibuprofen (ADVIL,MOTRIN) 800 MG tablet TAKE 1 TABLET (800 MG TOTAL) BY MOUTH EVERY 8 (EIGHT) HOURS AS NEEDED FOR PAIN.  30 tablet  2  . lidocaine-prilocaine (EMLA) cream Apply topically as needed. For chemotherapy  30 g  0  .  LORazepam (ATIVAN) 0.5 MG tablet Take 1 tablet (0.5 mg total) by mouth at bedtime as needed for anxiety (Nausea or vomiting).  30 tablet  0  . polyethylene glycol powder (MIRALAX) powder Take 17 g by mouth daily as needed (constipation).  255 g  1  . prochlorperazine (COMPAZINE) 10 MG tablet Take 1 tablet (10 mg total) by mouth every 6 (six) hours as needed for nausea or vomiting.  30 tablet  1  . propranolol ER (INDERAL LA) 120 MG 24 hr capsule TAKE 1 CAPSULE (120 MG TOTAL) BY MOUTH DAILY.  93 capsule  1  . zolpidem (AMBIEN) 5 MG tablet TAKE 1 TABLET BY MOUTH AT BEDTIME AS NEEDED FOR SLEEP  30 tablet  0  . ondansetron (ZOFRAN) 8 MG tablet Take 1 tablet (8 mg total) by mouth 2 (two) times daily. 1 tab by mouth twice daily x 2 days after chemo, then 1 tab every 12 hrs PRN nausea  30 tablet  1   No current facility-administered medications for this visit.  Facility-Administered Medications Ordered in Other Visits  Medication Dose Route Frequency Provider Last Rate Last Dose  . sodium chloride 0.9 % injection 10 mL  10 mL Intracatheter PRN Lowella Dell, MD   10 mL at 06/21/13 1322    OBJECTIVE: Middle-aged [place] American woman  in no acute distress Filed Vitals:   07/05/13 0853  BP: 135/78  Pulse: 74  Temp: 97.8 F (36.6 C)  Resp: 18     Body mass index is 21.8 kg/(m^2).    ECOG FS: 0 Filed Weights   07/05/13 0853  Weight: 139 lb 3.2 oz (63.141 kg)   Physical Exam: HEENT:  Sclerae anicteric.  Oropharynx clear with no ulcerations or evidence of candidiasis. Nasal mucosa is benign, pink. Neck is supple. NODES:  No cervical or supraclavicular lymphadenopathy palpated.  BREAST EXAM: Deferred. Axillae are benign, no palpable lymphadenopathy. LUNGS:  Clear to auscultation bilaterally with good excursion.  No wheezes or rhonchi.   HEART:  Regular rate and rhythm. No murmur appreciated ABDOMEN:  Soft, nontender.  Positive bowel sounds.  MSK:  No focal spinal tenderness to palpation.  Full range of motion in the upper extremities. No joint swelling. EXTREMITIES:  No peripheral edema.   SKIN:  Port is intact in the left upper chest wall, with no erythema or edema, no evidence of infection/cellulitis. Hyperpigmentation of the skin on the hands and nailbeds bilaterally, but no visible drainage or evidence of infection. NEURO:  Nonfocal. Well oriented.  Appropriate affect.    LAB RESULTS:  Lab Results  Component Value Date   WBC 2.2* 07/05/2013   NEUTROABS 1.2* 07/05/2013   HGB 10.2* 07/05/2013   HCT 30.7* 07/05/2013   MCV 90.6 07/05/2013   PLT 251 07/05/2013      Chemistry      Component Value Date/Time   NA 140 06/21/2013 0929   NA 138 06/16/2011 0938   K 4.3 06/21/2013 0929   K 4.1 06/16/2011 0938   CL 110 06/16/2011 0938   CO2 26 06/21/2013 0929   CO2 26 06/16/2011 0938   BUN 12.4 06/21/2013 0929   BUN 10 06/16/2011 0938   CREATININE 0.7 06/21/2013 0929   CREATININE 0.81 06/16/2011 0938   CREATININE 0.74 07/26/2010 2332      Component Value Date/Time   CALCIUM 10.1 06/21/2013 0929   CALCIUM 9.5 06/16/2011 0938   ALKPHOS 102 06/21/2013 0929   ALKPHOS 78 06/16/2011 0938   AST 19 06/21/2013 0929   AST 13 06/16/2011 0938   ALT 24 06/21/2013 0929   ALT 10 06/16/2011 0938   BILITOT 0.32 06/21/2013 0929   BILITOT 0.5 06/16/2011 0938      STUDIES:  Echocardiogram on 03/08/2013 showed an ejection fraction of 55-60%.   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 Patient is undergoing neoadjuvant chemotherapy. Assess response to neoadjuvant therapy.  EXAM: MR BILATERAL BREAST WITHOUT AND WITH CONTRAST  TECHNIQUE: Multiplanar, multisequence MR images of both breasts were obtained prior to and following the intravenous administration of 13ml of MultiHance.  THREE-DIMENSIONAL MR IMAGE RENDERING ON INDEPENDENT  WORKSTATION:  Three-dimensional MR images were rendered by post-processing of the original MR data on an independent workstation. The three-dimensional MR images were interpreted, and findings are reported in the following complete MRI report for this study.  COMPARISON:  Previous exams  FINDINGS: Breast composition: c:  Heterogeneous fibroglandular tissue  Background parenchymal enhancement: Mild to moderate.  Right breast: The previously described areas of clumped linear enhancement located within the central right breast which were shown to represent fibrocystic change on MR guided biopsies appear stable. There are no new enhancing foci within the right breast.  Left breast: The previously seen adjacent enhancing masses located within the superior left breast are no longer visible by MR and there is no abnormal enhancement in this region. There are no new enhancing foci.  Lymph nodes: No abnormal appearing lymph nodes.  Ancillary findings:  None.  IMPRESSION: Excellent response to neoadjuvant chemotherapy with no residual enhancing masses visible within the superior left breast. The study is otherwise unchanged.  RECOMMENDATION: Treatment plan.  BI-RADS CATEGORY  6: Known biopsy-proven malignancy - appropriate action should be taken.   Electronically Signed   By: Anselmo Pickler M.D.   On: 05/02/2013 15:46     ASSESSMENT: 52 y.o. Ana Young woman   (1)  status post left breast upper outer quadrant biopsy 02/22/2013 for a clinical T2 N0, stage IIA invasive ductal carcinoma, grade 3, triple negative, with an MIB-1 of 100%.    (2) MRI on 03/02/2013 showed an additional nodule at the 2:00 position, 5 mm lateral to the primary in the left breast, measuring 10 x 6 x 9 mm. This was biopsied on 03/11/2013 and was consistent with high-grade carcinoma [phone] Prognostic panel is pending.   (3) MRI on 03/02/2013 also showed 2 areas of irregularity in the right breast which were worrisome for possible DCIS.  Both of these areas were biopsied on 03/04/2013, and both showed fibrocystic changes with no evidence of malignancy ([id number]).   (4) genetics testing is pending  (5)  treated in the neoadjuvant setting with   (a) 4 dose dense cycles of doxorubicin/ cyclophosphamide, with the first dose given on 03/15/2013 and the final one 04/26/2013, with a complete radiologic response  (b) followed by 12 weekly doses of carboplatin/paclitaxel started 05/17/2013, given in standard dose, with all neoadjuvant chemotherapy to be completed prior to definitive surgery.    PLAN: Ana Young will proceed to treatment today as scheduled for her 8th weekly dose of carboplatin/paclitaxel. We will continue to see her on weekly basis with close followup, and Ana Young is already scheduled to see me again next week on January 6 in anticipation of her ninth weekly dose. Ana Young is already scheduled out all the way through late January, completing 12 weekly doses of this regimen. Soon thereafter, Ana Young will have a repeat breast MRI and Ana Young will be seeing both Dr. Darnelle Catalan and Dr. Donell Beers to discuss her definitive surgery.   I will mention that Ana Young tends to have a slightly reduced ANC, but we will continue to treat as long as her ANC is 1.0 or above. This will continue to be checked on a weekly basis, and I have also alerted our pharmacy and our charge nurse with regards to these guidelines.   Ana Young voices understanding and agreement with our plan as stated above. Ana Young will call prior to her appointment next week with any changes or problems.  Zollie Scale, PA-C   07/05/2013 9:32 AM

## 2013-07-05 NOTE — Patient Instructions (Signed)
Cancer Center Discharge Instructions for Patients Receiving Chemotherapy  Today you received the following chemotherapy agents Taxol/Carboplatin To help prevent nausea and vomiting after your treatment, we encourage you to take your nausea medication as prescribed.  If you develop nausea and vomiting that is not controlled by your nausea medication, call the clinic.   BELOW ARE SYMPTOMS THAT SHOULD BE REPORTED IMMEDIATELY:  *FEVER GREATER THAN 100.5 F  *CHILLS WITH OR WITHOUT FEVER  NAUSEA AND VOMITING THAT IS NOT CONTROLLED WITH YOUR NAUSEA MEDICATION  *UNUSUAL SHORTNESS OF BREATH  *UNUSUAL BRUISING OR BLEEDING  TENDERNESS IN MOUTH AND THROAT WITH OR WITHOUT PRESENCE OF ULCERS  *URINARY PROBLEMS  *BOWEL PROBLEMS  UNUSUAL RASH Items with * indicate a potential emergency and should be followed up as soon as possible.  Feel free to call the clinic you have any questions or concerns. The clinic phone number is [phone].    

## 2013-07-07 DIAGNOSIS — C50919 Malignant neoplasm of unspecified site of unspecified female breast: Secondary | ICD-10-CM

## 2013-07-07 DIAGNOSIS — Z9221 Personal history of antineoplastic chemotherapy: Secondary | ICD-10-CM

## 2013-07-07 DIAGNOSIS — Z923 Personal history of irradiation: Secondary | ICD-10-CM

## 2013-07-07 HISTORY — DX: Personal history of irradiation: Z92.3

## 2013-07-07 HISTORY — DX: Personal history of antineoplastic chemotherapy: Z92.21

## 2013-07-07 HISTORY — DX: Malignant neoplasm of unspecified site of unspecified female breast: C50.919

## 2013-07-07 HISTORY — PX: BREAST LUMPECTOMY: SHX2

## 2013-07-11 ENCOUNTER — Other Ambulatory Visit: Payer: BC Managed Care – PPO

## 2013-07-12 ENCOUNTER — Ambulatory Visit (HOSPITAL_BASED_OUTPATIENT_CLINIC_OR_DEPARTMENT_OTHER): Payer: BC Managed Care – PPO | Admitting: Physician Assistant

## 2013-07-12 ENCOUNTER — Encounter: Payer: Self-pay | Admitting: Physician Assistant

## 2013-07-12 ENCOUNTER — Other Ambulatory Visit (HOSPITAL_BASED_OUTPATIENT_CLINIC_OR_DEPARTMENT_OTHER): Payer: BC Managed Care – PPO

## 2013-07-12 ENCOUNTER — Ambulatory Visit (HOSPITAL_BASED_OUTPATIENT_CLINIC_OR_DEPARTMENT_OTHER): Payer: BC Managed Care – PPO

## 2013-07-12 VITALS — BP 122/74 | HR 82 | Temp 98.4°F | Resp 18 | Ht 67.0 in | Wt 139.1 lb

## 2013-07-12 DIAGNOSIS — D649 Anemia, unspecified: Secondary | ICD-10-CM

## 2013-07-12 DIAGNOSIS — D709 Neutropenia, unspecified: Secondary | ICD-10-CM

## 2013-07-12 DIAGNOSIS — C50419 Malignant neoplasm of upper-outer quadrant of unspecified female breast: Secondary | ICD-10-CM

## 2013-07-12 DIAGNOSIS — C50412 Malignant neoplasm of upper-outer quadrant of left female breast: Secondary | ICD-10-CM

## 2013-07-12 DIAGNOSIS — Z171 Estrogen receptor negative status [ER-]: Secondary | ICD-10-CM

## 2013-07-12 DIAGNOSIS — Z5111 Encounter for antineoplastic chemotherapy: Secondary | ICD-10-CM

## 2013-07-12 LAB — CBC WITH DIFFERENTIAL/PLATELET
BASO%: 1.9 % (ref 0.0–2.0)
Basophils Absolute: 0 10*3/uL (ref 0.0–0.1)
EOS%: 0.5 % (ref 0.0–7.0)
Eosinophils Absolute: 0 10*3/uL (ref 0.0–0.5)
HCT: 31.1 % — ABNORMAL LOW (ref 34.8–46.6)
HGB: 10.4 g/dL — ABNORMAL LOW (ref 11.6–15.9)
LYMPH%: 35.1 % (ref 14.0–49.7)
MCH: 30.3 pg (ref 25.1–34.0)
MCHC: 33.4 g/dL (ref 31.5–36.0)
MCV: 90.7 fL (ref 79.5–101.0)
MONO#: 0.2 10*3/uL (ref 0.1–0.9)
MONO%: 9.5 % (ref 0.0–14.0)
NEUT%: 53 % (ref 38.4–76.8)
NEUTROS ABS: 1.1 10*3/uL — AB (ref 1.5–6.5)
NRBC: 0 % (ref 0–0)
Platelets: 214 10*3/uL (ref 145–400)
RBC: 3.43 10*6/uL — AB (ref 3.70–5.45)
RDW: 16.6 % — AB (ref 11.2–14.5)
WBC: 2.1 10*3/uL — ABNORMAL LOW (ref 3.9–10.3)
lymph#: 0.7 10*3/uL — ABNORMAL LOW (ref 0.9–3.3)

## 2013-07-12 MED ORDER — DIPHENHYDRAMINE HCL 50 MG/ML IJ SOLN
50.0000 mg | Freq: Once | INTRAMUSCULAR | Status: AC
  Administered 2013-07-12: 50 mg via INTRAVENOUS

## 2013-07-12 MED ORDER — SODIUM CHLORIDE 0.9 % IV SOLN
80.0000 mg/m2 | Freq: Once | INTRAVENOUS | Status: AC
  Administered 2013-07-12: 138 mg via INTRAVENOUS
  Filled 2013-07-12: qty 23

## 2013-07-12 MED ORDER — SODIUM CHLORIDE 0.9 % IJ SOLN
10.0000 mL | INTRAMUSCULAR | Status: DC | PRN
  Administered 2013-07-12: 10 mL
  Filled 2013-07-12: qty 10

## 2013-07-12 MED ORDER — DIPHENHYDRAMINE HCL 50 MG/ML IJ SOLN
INTRAMUSCULAR | Status: AC
  Filled 2013-07-12: qty 1

## 2013-07-12 MED ORDER — FAMOTIDINE IN NACL 20-0.9 MG/50ML-% IV SOLN
20.0000 mg | Freq: Once | INTRAVENOUS | Status: AC
  Administered 2013-07-12: 20 mg via INTRAVENOUS

## 2013-07-12 MED ORDER — DEXAMETHASONE SODIUM PHOSPHATE 20 MG/5ML IJ SOLN
20.0000 mg | Freq: Once | INTRAMUSCULAR | Status: AC
  Administered 2013-07-12: 20 mg via INTRAVENOUS

## 2013-07-12 MED ORDER — ONDANSETRON 16 MG/50ML IVPB (CHCC)
16.0000 mg | Freq: Once | INTRAVENOUS | Status: AC
  Administered 2013-07-12: 16 mg via INTRAVENOUS

## 2013-07-12 MED ORDER — SODIUM CHLORIDE 0.9 % IV SOLN
250.6000 mg | Freq: Once | INTRAVENOUS | Status: AC
  Administered 2013-07-12: 250 mg via INTRAVENOUS
  Filled 2013-07-12: qty 25

## 2013-07-12 MED ORDER — HEPARIN SOD (PORK) LOCK FLUSH 100 UNIT/ML IV SOLN
500.0000 [IU] | Freq: Once | INTRAVENOUS | Status: AC | PRN
  Administered 2013-07-12: 500 [IU]
  Filled 2013-07-12: qty 5

## 2013-07-12 MED ORDER — SODIUM CHLORIDE 0.9 % IV SOLN
Freq: Once | INTRAVENOUS | Status: AC
  Administered 2013-07-12: 10:00:00 via INTRAVENOUS

## 2013-07-12 MED ORDER — ONDANSETRON HCL 8 MG PO TABS: 8.0000 mg | ORAL_TABLET | Freq: Two times a day (BID) | ORAL | Status: DC

## 2013-07-12 MED ORDER — FAMOTIDINE IN NACL 20-0.9 MG/50ML-% IV SOLN
INTRAVENOUS | Status: AC
  Filled 2013-07-12: qty 50

## 2013-07-12 MED ORDER — ONDANSETRON 16 MG/50ML IVPB (CHCC)
INTRAVENOUS | Status: AC
  Filled 2013-07-12: qty 16

## 2013-07-12 MED ORDER — DEXAMETHASONE SODIUM PHOSPHATE 20 MG/5ML IJ SOLN
INTRAMUSCULAR | Status: AC
  Filled 2013-07-12: qty 5

## 2013-07-12 NOTE — Progress Notes (Signed)
Ok to treat with ANC 1.1 per Micah Flesher.

## 2013-07-12 NOTE — Patient Instructions (Signed)
[facility] Discharge Instructions for Patients Receiving Chemotherapy  Today you received the following chemotherapy agents: taxol, carboplatin  To help prevent nausea and vomiting after your treatment, we encourage you to take your nausea medication.  Take it as often as prescribed.     If you develop nausea and vomiting that is not controlled by your nausea medication, call the clinic. If it is after clinic hours your family physician or the after hours number for the clinic or go to the Emergency Department.   BELOW ARE SYMPTOMS THAT SHOULD BE REPORTED IMMEDIATELY:  *FEVER GREATER THAN 100.5 F  *CHILLS WITH OR WITHOUT FEVER  NAUSEA AND VOMITING THAT IS NOT CONTROLLED WITH YOUR NAUSEA MEDICATION  *UNUSUAL SHORTNESS OF BREATH  *UNUSUAL BRUISING OR BLEEDING  TENDERNESS IN MOUTH AND THROAT WITH OR WITHOUT PRESENCE OF ULCERS  *URINARY PROBLEMS  *BOWEL PROBLEMS  UNUSUAL RASH Items with * indicate a potential emergency and should be followed up as soon as possible.  Feel free to call the clinic you have any questions or concerns. The clinic phone number is [phone].   I have been informed and understand all the instructions given to me. I know to contact the clinic, my physician, or go to the Emergency Department if any problems should occur. I do not have any questions at this time, but understand that I may call the clinic during office hours   should I have any questions or need assistance in obtaining follow up care.    __________________________________________  _____________  __________ Signature of Patient or Authorized Representative            Date                   Time    __________________________________________ Nurse's Signature    

## 2013-07-12 NOTE — Progress Notes (Signed)
ID: [name] OB: 21-Dec-1960  MR#: [id number]  CSN#:[id number]  PCP: Christa See, MD GYN:   SU: Stark Klein OTHER MD: Thea Silversmith, Dalbert Mayotte  CHIEF COMPLAINT:  Left  Breast Cancer/Neoadjuvant Chemotherapy  HISTORY OF PRESENT ILLNESS: Ana Young herself palpated a mass in her left breast 01/15/2013 and brought it to the attention of Dr. Lindell Noe, who arranged for her to have bilateral digital mammography at the [facility] Center 01/27/2013. The patient does have heterogeneously dense breasts. In the outer upper left breast there was an oval mass by mammography measuring approximately 1.5 cm, which by palpation was firm and measured approximately 1 cm. Ultrasound showed this to be hypoechoic and to measure 1.4 cm.  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  The patient then underwent bilateral breast MRI 02/28/2013. In the left breast upper-outer quadrant the mass in question measured 2.9 cm. There was a second mass lateral to it measuring 1.0 cm. There were no additional abnormal appearing lymph nodes and no evidence of internal mammary adenopathy.  In the right breast there were 2 areas of irregular enhancement in the superior central third of the breast, both measuring 9 mm. These were felt to be worrisome for a ductal carcinoma in situ.  The patient's case was discussed at the multidisciplinary breast cancer conference 03/02/2013. Her subsequent history is as detailed below.  INTERVAL HISTORY: Ana Young returns today accompanied by her husband Ana Young for followup of her locally advanced left breast carcinoma. She is due for her 9th of 12 planned weekly doses of neoadjuvant carboplatin/paclitaxel today.  She continues to tolerate this regimen well.  Ana Young has no new complaints today.  She continues to have some occasional numbness in the very  tips of her fingers, but actually feels like this has improved since her visit one week ago. It is not affecting any of her day-to-day activities, and it is not affecting her lower extremities. She had only one episode of nausea with emesis after washing her car on Saturday. She tells me she "just did 2 months you". She is, however, able to perform all of her normal  day-to-day activities in the house, including basic housework.   REVIEW OF SYSTEMS: Cybill has had no fevers, chills, or night sweats and denies any rashes or skin changes other than hyperpigmentation on the hands and nailbeds.  She has some taste aversion.  She denies any change in bowel or bladder habits. She's had a little bit of a runny nose, and occasionally has some blood in the mucus. She's had no cough, phlegm production, increased shortness of breath, or chest pain.   She's had no abnormal headaches or dizziness. She denies any current myalgias, arthralgias, or bony pain, and has had no peripheral swelling.   A detailed review of systems is otherwise stable and noncontributory.    PAST MEDICAL HISTORY: Past Medical History  Diagnosis Date  . Depression   . Vitamin D deficiency   . Anxiety   . Seasonal allergies   . HTN (hypertension)   . Insomnia   . History of migraines   . Cancer     Left breast cancer  . Breast cancer   . Anemia 06/07/2013    PAST SURGICAL HISTORY: Past Surgical History  Procedure Laterality Date  . Bunionectomy Left 2009  . Portacath placement Left 03/09/2013    Procedure: INSERTION PORT-A-CATH;  Surgeon: Dorris Fetch  Barry Dienes, MD;  Location: [facility];  Service: General;  Laterality: Left;    FAMILY HISTORY Family History  Problem Relation Age of Onset  . Heart attack Brother   . Congestive Heart Failure Mother   . Stroke Father   . Diabetes Father   . Breast cancer Maternal Grandmother 97   the patient's parents are in their 39s. The patient has one brother, no sisters. The  patient's maternal grandmother was diagnosed with breast cancer the age of 12. There is no other history of breast cancer in the family to the patient's knowledge, and no history of ovarian cancer  GYNECOLOGIC HISTORY:  Menarche age 43, first live birth age 78, the patient is GX P2. Her periods are not irregular, but still every 2-3 months, and otherwise unremarkable.  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 Her husband Ana Young works as a Nurse, learning disability. Son Ana Young lives in [place] and works in Scientist, research (medical). Daughter Ana Young is 10 year old and at home. The patient has no grandchildren. She attends the [employer]    ADVANCED DIRECTIVES: Not in place  HEALTH MAINTENANCE:  (Updated December 2014) History  Substance Use Topics  . Smoking status: Never Smoker   . Smokeless tobacco: Never Used  . Alcohol Use: No     Colonoscopy: - Never  PAP: Feb 2012, Dr. Buelah Manis  Bone density: - Never  Lipid panel:  Dec 2012, Dr. Zigmund Daniel   No Known Allergies  Current Outpatient Prescriptions  Medication Sig Dispense Refill  . ALPRAZolam (XANAX) 1 MG tablet Take 1 mg by mouth 2 (two) times daily as needed for sleep (1/2 tablet bid as needed).      Marland Kitchen buPROPion (WELLBUTRIN XL) 300 MG 24 hr tablet TAKE 1 TABLET (300 MG TOTAL) BY MOUTH EVERY MORNING.  30 tablet  3  . docusate sodium (COLACE) 100 MG capsule Take 1-2 capsules (100-200 mg total) by mouth every morning. As directed for constipation  30 capsule  1  . ibuprofen (ADVIL,MOTRIN) 800 MG tablet TAKE 1 TABLET (800 MG TOTAL) BY MOUTH EVERY 8 (EIGHT) HOURS AS NEEDED FOR PAIN.  30 tablet  2  . lidocaine-prilocaine (EMLA) cream Apply topically as needed. For chemotherapy  30 g  0  . LORazepam (ATIVAN) 0.5 MG tablet Take 1 tablet (0.5 mg total) by mouth at bedtime as needed for anxiety (Nausea or vomiting).  30 tablet  0  .  ondansetron (ZOFRAN) 8 MG tablet Take 1 tablet (8 mg total) by mouth 2 (two) times daily. 1 tab by mouth twice daily x 2 days after chemo, then 1 tab every 12 hrs PRN nausea  30 tablet  1  . polyethylene glycol powder (MIRALAX) powder Take 17 g by mouth daily as needed (constipation).  255 g  1  . prochlorperazine (COMPAZINE) 10 MG tablet Take 1 tablet (10 mg total) by mouth every 6 (six) hours as needed for nausea or vomiting.  30 tablet  1  . propranolol ER (INDERAL LA) 120 MG 24 hr capsule TAKE 1 CAPSULE (120 MG TOTAL) BY MOUTH DAILY.  93 capsule  1  . zolpidem (AMBIEN) 5 MG tablet TAKE 1 TABLET BY MOUTH AT BEDTIME AS NEEDED FOR SLEEP  30 tablet  0   No current facility-administered medications for this visit.   Facility-Administered Medications Ordered in Other Visits  Medication Dose Route Frequency Provider Last  Rate Last Dose  . sodium chloride 0.9 % injection 10 mL  10 mL Intracatheter PRN Chauncey Cruel, MD   10 mL at 06/21/13 1322    OBJECTIVE: Middle-aged [place] American woman  in no acute distress Filed Vitals:   07/12/13 0904  BP: 122/74  Pulse: 82  Temp: 98.4 F (36.9 C)  Resp: 18     Body mass index is 21.78 kg/(m^2).    ECOG FS: 0 Filed Weights   07/12/13 0904  Weight: 139 lb 1.6 oz (63.095 kg)   Physical Exam: HEENT:  Sclerae anicteric.  Oropharynx clear,pink, and moist  with no ulcerations or evidence of candidiasis.  Neck is supple. NODES:  No cervical or supraclavicular lymphadenopathy palpated.  BREAST EXAM: Right breast is status post biopsy in the upper inner quadrant that is otherwise benign. Unable to palpate a distinct mass in the left breast today. No skin changes or nipple inversion noted.  Axillae are benign, no palpable lymphadenopathy. LUNGS:  Clear to auscultation bilaterally with good excursion.  No wheezes or rhonchi.   HEART:  Regular rate and rhythm. No murmur ABDOMEN:  Soft, nontender.   Normoactive bowel sounds.  MSK:  No focal spinal  tenderness to palpation. Full range of motion in the upper extremities. No joint swelling. EXTREMITIES:  No peripheral edema.   SKIN:  Port is intact in the left upper chest wall, with no erythema or edema, no evidence of infection/cellulitis. Hyperpigmentation of the skin on the hands and nailbeds bilaterally.   There is some loosening of the nails from the nail beds bilaterally, but no visible drainage or evidence of infection.  NEURO:  Nonfocal. Well oriented.  Appropriate affect.    LAB RESULTS:  Lab Results  Component Value Date   WBC 2.1* 07/12/2013   NEUTROABS 1.1* 07/12/2013   HGB 10.4* 07/12/2013   HCT 31.1* 07/12/2013   MCV 90.7 07/12/2013   PLT 214 07/12/2013      Chemistry      Component Value Date/Time   NA 138 07/05/2013 0842   NA 138 06/16/2011 0938   K 4.0 07/05/2013 0842   K 4.1 06/16/2011 0938   CL 110 06/16/2011 0938   CO2 23 07/05/2013 0842   CO2 26 06/16/2011 0938   BUN 12.7 07/05/2013 0842   BUN 10 06/16/2011 0938   CREATININE 0.7 07/05/2013 0842   CREATININE 0.81 06/16/2011 0938   CREATININE 0.74 07/26/2010 2332      Component Value Date/Time   CALCIUM 9.9 07/05/2013 0842   CALCIUM 9.5 06/16/2011 0938   ALKPHOS 91 07/05/2013 0842   ALKPHOS 78 06/16/2011 0938   AST 16 07/05/2013 0842   AST 13 06/16/2011 0938   ALT 26 07/05/2013 0842   ALT 10 06/16/2011 0938   BILITOT 0.26 07/05/2013 0842   BILITOT 0.5 06/16/2011 0938      STUDIES:  Echocardiogram on 03/08/2013 showed an ejection fraction of 55-60%.   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 Patient is undergoing neoadjuvant chemotherapy. Assess response to neoadjuvant therapy.  EXAM: MR BILATERAL BREAST WITHOUT AND WITH CONTRAST  TECHNIQUE: Multiplanar, multisequence MR images of both breasts were obtained prior to and following the intravenous  administration of 71m of MultiHance.  THREE-DIMENSIONAL MR IMAGE RENDERING ON INDEPENDENT WORKSTATION:  Three-dimensional MR images were rendered by post-processing of the original MR data on an independent  workstation. The three-dimensional MR images were interpreted, and findings are reported in the following complete MRI report for this study.  COMPARISON:  Previous exams  FINDINGS: Breast composition: c:  Heterogeneous fibroglandular tissue  Background parenchymal enhancement: Mild to moderate.  Right breast: The previously described areas of clumped linear enhancement located within the central right breast which were shown to represent fibrocystic change on MR guided biopsies appear stable. There are no new enhancing foci within the right breast.  Left breast: The previously seen adjacent enhancing masses located within the superior left breast are no longer visible by MR and there is no abnormal enhancement in this region. There are no new enhancing foci.  Lymph nodes: No abnormal appearing lymph nodes.  Ancillary findings:  None.  IMPRESSION: Excellent response to neoadjuvant chemotherapy with no residual enhancing masses visible within the superior left breast. The study is otherwise unchanged.  RECOMMENDATION: Treatment plan.  BI-RADS CATEGORY  6: Known biopsy-proven malignancy - appropriate action should be taken.   Electronically Signed   By: Luberta Robertson M.D.   On: 05/02/2013 15:46     ASSESSMENT: 53 y.o. 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    (2) MRI on 03/02/2013 showed an additional nodule at the 2:00 position, 5 mm lateral to the primary in the left breast, measuring 10 x 6 x 9 mm. This was biopsied on 03/11/2013 and was consistent with high-grade carcinoma [phone]). Prognostic panel is pending.   (3) MRI on 03/02/2013 also  showed 2 areas of irregularity in the right breast which were worrisome for possible DCIS. Both of these areas were biopsied on 03/04/2013, and both showed fibrocystic changes with no evidence of malignancy ([id number]).   (4) genetics testing is pending  (5)  treated in the neoadjuvant setting with   (a) 4 dose dense cycles of doxorubicin/ cyclophosphamide, with the first dose given on 03/15/2013 and the final one 04/26/2013, with a complete radiologic response  (b) followed by 12 weekly doses of carboplatin/paclitaxel started 05/17/2013, given in standard dose, with all neoadjuvant chemotherapy to be completed prior to definitive surgery.    PLAN: Tashena will proceed to treatment today as scheduled for her 9th weekly dose of carboplatin/paclitaxel. As previously noted, Shannah tends to have a slightly reduced ANC, but we will continue to treat as long as her [place] is 1.0 or above. This will continue to be checked on a weekly basis, and I have also alerted our pharmacy and our charge nurse with regards to these guidelines.   I will see Her again next week on January 13 prior to her 10th weekly dose of carboplatin/paclitaxel. She is already scheduled out all the way through late January, completing 12 weekly doses of this regimen. Soon thereafter, she will have a repeat breast MRI and she will be seeing both Dr. Jana Hakim and Dr. Barry Dienes to discuss her definitive surgery.   Dima voices understanding and agreement with our plan as stated above. She will call prior to her appointment next week with any changes or problems.  Micah Flesher, PA-C   07/12/2013 9:24 AM

## 2013-07-14 ENCOUNTER — Ambulatory Visit (INDEPENDENT_AMBULATORY_CARE_PROVIDER_SITE_OTHER): Payer: BC Managed Care – PPO | Admitting: General Surgery

## 2013-07-14 ENCOUNTER — Encounter (INDEPENDENT_AMBULATORY_CARE_PROVIDER_SITE_OTHER): Payer: Self-pay | Admitting: General Surgery

## 2013-07-14 VITALS — BP 116/60 | HR 70 | Temp 97.8°F | Resp 16 | Ht 67.0 in | Wt 143.6 lb

## 2013-07-14 DIAGNOSIS — C50912 Malignant neoplasm of unspecified site of left female breast: Secondary | ICD-10-CM

## 2013-07-14 DIAGNOSIS — C50412 Malignant neoplasm of upper-outer quadrant of left female breast: Secondary | ICD-10-CM

## 2013-07-14 DIAGNOSIS — C50419 Malignant neoplasm of upper-outer quadrant of unspecified female breast: Secondary | ICD-10-CM

## 2013-07-14 DIAGNOSIS — C50919 Malignant neoplasm of unspecified site of unspecified female breast: Secondary | ICD-10-CM

## 2013-07-14 NOTE — Patient Instructions (Signed)
IF YOU ARE TAKING ASPIRIN, COUMADIN/WARFARIN, PLAVIX, OR OTHER BLOOD THINNER, PLEASE LET us KNOW IMMEDIATELY.  WE WILL NEED TO DISCUSS WITH THE PRESCRIBING PROVIDER IF THESE ARE SAFE TO STOP. IF THESE ARE NOT STOPPED AT THE APPROPRIATE TIME, THIS WILL RESULT IN A DELAY FOR YOUR SURGERY.  DO NOT TAKE THESE MEDICATIONS OR IBUPROFEN/NAPROXEN WITHIN A WEEK BEFORE SURGERY.   The main risks of surgery are bleeding, infection, damage to other structures, and seroma (accumulation of fluid) under the incision site(s).    These complications may lead to additional procedures such as drainage of seroma/infection.  You may need other surgeries to obtain negative margins or to take more lymph nodes.   Most women do accumulate fluid in the breast cavity where the specimen was removed. We do not always have to drain this fluid.  If your breast is very tense, painful, or red, then we may need to numb the skin and use a needle to aspirate the fluid.  We do provide patients with a Breast Binder.  The purpose of this is to avoid the use of tape on the sensitive tissue of the breast and to provide some compression to minimize the risk of seroma.  If the binder is uncomfortable, you may find that a tank top with a built-in shelf bra or a loose sports bra works better for you.  I recommend wearing this around the clock for the first 1-2 weeks except in the shower.    You may remove your dressings and may shower 48 hours after surgery IF YOU DO NOT HAVE A DRAIN.    Many patients have some constipation in the week after surgery due to the narcotics and anesthesia.  You may need over the counter stool softeners or laxatives if you experience difficulty having bowel movements.    If the following occur, call our office at [phone]: If you have a fever over 101 or pain that is severe despite narcotics. If you have redness or drainage at the wound. If you develop persistent nausea or vomiting.  I will follow you back  up in 1-4 weeks.    Please submit any paperwork about time off work/insurance forms to the front desk.

## 2013-07-15 NOTE — Assessment & Plan Note (Signed)
Patient has no clinical evidence of tumor following neoadjuvant chemotherapy.  I reviewed her studies with radiology. She does appear to be a reasonable breast conservation candidate because these areas are all very close together. They also are extremely lateral almost in the anterior axillary line.  She may actually not lose that much breast tissue considering where these are located.  In any event, if she isn't happy with her cosmetic result, we'll refer her to plastic surgery. She does very much desired breast conservation and is willing to proceed with the understanding that she may require additional surgeries.  Her chemotherapy will be completed on January 27. I'll schedule her for a needle localized partial mastectomy and sentinel lymph node biopsy in mid to late February.  The surgical procedure was described to the patient.  I discussed the incision type and location and that we would need radiology involved on the day of surgery with a wire marker and sentinel node.      The risks and benefits of the procedure were described to the patient and she wishes to proceed.    We discussed the risks bleeding, infection, damage to other structures, need for further procedures/surgeries.  We discussed the risk of seroma.  The patient was advised that we may need to go back to surgery for additional tissue to obtain negative margins or for additional lymph nodes. The patient was advised that these are the most common complications, but that others can occur as well.  They were advised against taking aspirin or other anti-inflammatory agents/blood thinners the week before surgery.

## 2013-07-15 NOTE — Progress Notes (Signed)
HISTORY: Pt is a 53 yo F who was diagnosed with breast cancer in august this year.  She has triple negative disease, and she has been getting neoadjuvant chemotherapy.  She has had a main lesion, a satellite lesion, and a third area lateral in the left breast.  Patient continues to tolerate her chemotherapy well. She has almost completed her neoadjuvant regimen. She has no complaints other than alopecia. She has not been able to palpate the mass now for several months.  She is accompanied by her husband.  PERTINENT REVIEW OF SYSTEMS: Otherwise negative x 11.    Filed Vitals:   07/14/13 1128  BP: 116/60  Pulse: 70  Temp: 97.8 F (36.6 C)  Resp: 16   Filed Weights   07/14/13 1128  Weight: 143 lb 9.6 oz (65.137 kg)     EXAM: Head: Normocephalic and atraumatic.  Eyes:  Conjunctivae are normal. Pupils are equal, round, and reactive to light. No scleral icterus.  Neck:  Normal range of motion. Neck supple. No tracheal deviation present. No thyromegaly present.  Resp: No respiratory distress, normal effort. Breast:  Far left lateral breast mass is no longer palpable.  There is no thickening as there was at her last exam. The tissue in the region the mass is completely soft and felt like the remaining breast tissue.  No lymphadenopathy is palpable on either side.   Abd:  Abdomen is soft, non distended and non tender. No masses are palpable.  There is no rebound and no guarding.  Neurological: Alert and oriented to person, place, and time. Coordination normal.  Skin: Skin is warm and dry. No rash noted. No diaphoretic. No erythema. No pallor.  Psychiatric: Normal mood and affect. Normal behavior. Judgment and thought content normal.      ASSESSMENT AND PLAN:   Breast cancer of upper-outer quadrant of left female breast Patient has no clinical evidence of tumor following neoadjuvant chemotherapy.  I reviewed her studies with radiology. She does appear to be a reasonable breast  conservation candidate because these areas are all very close together. They also are extremely lateral almost in the anterior axillary line.  She may actually not lose that much breast tissue considering where these are located.  In any event, if she isn't happy with her cosmetic result, we'll refer her to plastic surgery. She does very much desired breast conservation and is willing to proceed with the understanding that she may require additional surgeries.  Her chemotherapy will be completed on January 27. I'll schedule her for a needle localized partial mastectomy and sentinel lymph node biopsy in mid to late February.  The surgical procedure was described to the patient.  I discussed the incision type and location and that we would need radiology involved on the day of surgery with a wire marker and sentinel node.      The risks and benefits of the procedure were described to the patient and she wishes to proceed.    We discussed the risks bleeding, infection, damage to other structures, need for further procedures/surgeries.  We discussed the risk of seroma.  The patient was advised that we may need to go back to surgery for additional tissue to obtain negative margins or for additional lymph nodes. The patient was advised that these are the most common complications, but that others can occur as well.  They were advised against taking aspirin or other anti-inflammatory agents/blood thinners the week before surgery.  Milus Height, MD Surgical Oncology, [facility]  [address] [place], [place] [address], [place], [place]

## 2013-07-19 ENCOUNTER — Encounter: Payer: Self-pay | Admitting: Physician Assistant

## 2013-07-19 ENCOUNTER — Ambulatory Visit (HOSPITAL_BASED_OUTPATIENT_CLINIC_OR_DEPARTMENT_OTHER): Payer: BC Managed Care – PPO | Admitting: Physician Assistant

## 2013-07-19 ENCOUNTER — Ambulatory Visit (HOSPITAL_BASED_OUTPATIENT_CLINIC_OR_DEPARTMENT_OTHER): Payer: BC Managed Care – PPO

## 2013-07-19 ENCOUNTER — Other Ambulatory Visit (HOSPITAL_BASED_OUTPATIENT_CLINIC_OR_DEPARTMENT_OTHER): Payer: BC Managed Care – PPO

## 2013-07-19 VITALS — BP 129/80 | HR 80 | Temp 97.9°F | Resp 18 | Ht 67.0 in | Wt 136.5 lb

## 2013-07-19 DIAGNOSIS — G609 Hereditary and idiopathic neuropathy, unspecified: Secondary | ICD-10-CM

## 2013-07-19 DIAGNOSIS — C50419 Malignant neoplasm of upper-outer quadrant of unspecified female breast: Secondary | ICD-10-CM

## 2013-07-19 DIAGNOSIS — D649 Anemia, unspecified: Secondary | ICD-10-CM

## 2013-07-19 DIAGNOSIS — C50412 Malignant neoplasm of upper-outer quadrant of left female breast: Secondary | ICD-10-CM

## 2013-07-19 DIAGNOSIS — D709 Neutropenia, unspecified: Secondary | ICD-10-CM

## 2013-07-19 DIAGNOSIS — Z171 Estrogen receptor negative status [ER-]: Secondary | ICD-10-CM

## 2013-07-19 DIAGNOSIS — K5900 Constipation, unspecified: Secondary | ICD-10-CM

## 2013-07-19 DIAGNOSIS — Z5111 Encounter for antineoplastic chemotherapy: Secondary | ICD-10-CM

## 2013-07-19 LAB — COMPREHENSIVE METABOLIC PANEL (CC13)
ALBUMIN: 3.8 g/dL (ref 3.5–5.0)
ALK PHOS: 88 U/L (ref 40–150)
ALT: 17 U/L (ref 0–55)
AST: 14 U/L (ref 5–34)
Anion Gap: 9 mEq/L (ref 3–11)
BUN: 10.7 mg/dL (ref 7.0–26.0)
CO2: 27 mEq/L (ref 22–29)
Calcium: 9.7 mg/dL (ref 8.4–10.4)
Chloride: 104 mEq/L (ref 98–109)
Creatinine: 0.7 mg/dL (ref 0.6–1.1)
GLUCOSE: 110 mg/dL (ref 70–140)
Potassium: 4 mEq/L (ref 3.5–5.1)
SODIUM: 139 meq/L (ref 136–145)
TOTAL PROTEIN: 6.8 g/dL (ref 6.4–8.3)
Total Bilirubin: 0.28 mg/dL (ref 0.20–1.20)

## 2013-07-19 LAB — CBC WITH DIFFERENTIAL/PLATELET
BASO%: 0.5 % (ref 0.0–2.0)
BASOS ABS: 0 10*3/uL (ref 0.0–0.1)
EOS%: 0 % (ref 0.0–7.0)
Eosinophils Absolute: 0 10*3/uL (ref 0.0–0.5)
HEMATOCRIT: 32.1 % — AB (ref 34.8–46.6)
HEMOGLOBIN: 10.7 g/dL — AB (ref 11.6–15.9)
LYMPH#: 0.9 10*3/uL (ref 0.9–3.3)
LYMPH%: 42.2 % (ref 14.0–49.7)
MCH: 30.7 pg (ref 25.1–34.0)
MCHC: 33.3 g/dL (ref 31.5–36.0)
MCV: 92 fL (ref 79.5–101.0)
MONO#: 0.2 10*3/uL (ref 0.1–0.9)
MONO%: 9.2 % (ref 0.0–14.0)
NEUT%: 48.1 % (ref 38.4–76.8)
NEUTROS ABS: 1.1 10*3/uL — AB (ref 1.5–6.5)
Platelets: 189 10*3/uL (ref 145–400)
RBC: 3.49 10*6/uL — ABNORMAL LOW (ref 3.70–5.45)
RDW: 16.2 % — ABNORMAL HIGH (ref 11.2–14.5)
WBC: 2.2 10*3/uL — ABNORMAL LOW (ref 3.9–10.3)
nRBC: 0 % (ref 0–0)

## 2013-07-19 MED ORDER — SODIUM CHLORIDE 0.9 % IV SOLN
250.6000 mg | Freq: Once | INTRAVENOUS | Status: AC
  Administered 2013-07-19: 250 mg via INTRAVENOUS
  Filled 2013-07-19: qty 25

## 2013-07-19 MED ORDER — ONDANSETRON 16 MG/50ML IVPB (CHCC)
16.0000 mg | Freq: Once | INTRAVENOUS | Status: AC
  Administered 2013-07-19: 16 mg via INTRAVENOUS

## 2013-07-19 MED ORDER — ONDANSETRON 16 MG/50ML IVPB (CHCC)
INTRAVENOUS | Status: AC
  Filled 2013-07-19: qty 16

## 2013-07-19 MED ORDER — SODIUM CHLORIDE 0.9 % IV SOLN
Freq: Once | INTRAVENOUS | Status: AC
  Administered 2013-07-19: 14:00:00 via INTRAVENOUS

## 2013-07-19 MED ORDER — FAMOTIDINE IN NACL 20-0.9 MG/50ML-% IV SOLN
20.0000 mg | Freq: Once | INTRAVENOUS | Status: AC
  Administered 2013-07-19: 20 mg via INTRAVENOUS

## 2013-07-19 MED ORDER — FAMOTIDINE IN NACL 20-0.9 MG/50ML-% IV SOLN
INTRAVENOUS | Status: AC
  Filled 2013-07-19: qty 50

## 2013-07-19 MED ORDER — DIPHENHYDRAMINE HCL 50 MG/ML IJ SOLN
50.0000 mg | Freq: Once | INTRAMUSCULAR | Status: AC
  Administered 2013-07-19: 50 mg via INTRAVENOUS

## 2013-07-19 MED ORDER — PACLITAXEL CHEMO INJECTION 300 MG/50ML
80.0000 mg/m2 | Freq: Once | INTRAVENOUS | Status: AC
  Administered 2013-07-19: 138 mg via INTRAVENOUS
  Filled 2013-07-19: qty 23

## 2013-07-19 MED ORDER — SODIUM CHLORIDE 0.9 % IJ SOLN
10.0000 mL | INTRAMUSCULAR | Status: DC | PRN
  Administered 2013-07-19: 10 mL
  Filled 2013-07-19: qty 10

## 2013-07-19 MED ORDER — DEXAMETHASONE SODIUM PHOSPHATE 20 MG/5ML IJ SOLN
INTRAMUSCULAR | Status: AC
  Filled 2013-07-19: qty 5

## 2013-07-19 MED ORDER — HEPARIN SOD (PORK) LOCK FLUSH 100 UNIT/ML IV SOLN
500.0000 [IU] | Freq: Once | INTRAVENOUS | Status: AC | PRN
  Administered 2013-07-19: 500 [IU]
  Filled 2013-07-19: qty 5

## 2013-07-19 MED ORDER — DIPHENHYDRAMINE HCL 50 MG/ML IJ SOLN
INTRAMUSCULAR | Status: AC
  Filled 2013-07-19: qty 1

## 2013-07-19 MED ORDER — DEXAMETHASONE SODIUM PHOSPHATE 20 MG/5ML IJ SOLN
20.0000 mg | Freq: Once | INTRAMUSCULAR | Status: AC
  Administered 2013-07-19: 20 mg via INTRAVENOUS

## 2013-07-19 NOTE — Progress Notes (Signed)
ID: Ana Young OB: 1961/03/07  MR#: [id number]  CSN#:[id number]  PCP: Ana See, Young GYN:   SU: Ana Young OTHER Young: Ana Young, Ana Young  CHIEF COMPLAINT:  Left  Breast Cancer/Neoadjuvant Chemotherapy  HISTORY OF PRESENT ILLNESS: Ana Young herself palpated a mass in her left breast 01/15/2013 and brought it to the attention of Ana Young, who arranged for her to have bilateral digital mammography at the [facility] Center 01/27/2013. The patient does have heterogeneously dense breasts. In the outer upper left breast there was an oval mass by mammography measuring approximately 1.5 cm, which by palpation was firm and measured approximately 1 cm. Ultrasound showed this to be hypoechoic and to measure 1.4 cm.  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  The patient then underwent bilateral breast MRI 02/28/2013. In the left breast upper-outer quadrant the mass in question measured 2.9 cm. There was a second mass lateral to it measuring 1.0 cm. There were no additional abnormal appearing lymph nodes and no evidence of internal mammary adenopathy.  In the right breast there were 2 areas of irregular enhancement in the superior central third of the breast, both measuring 9 mm. These were felt to be worrisome for a ductal carcinoma in situ.  The patient's case was discussed at the multidisciplinary breast cancer conference 03/02/2013. Her subsequent history is as detailed below.  INTERVAL HISTORY: Ana Young returns today accompanied by her husband Ana Young for followup of her locally advanced left breast carcinoma. She is due for her 10th of 12 planned weekly doses of neoadjuvant carboplatin/paclitaxel today.  She continues to tolerate this regimen well. Her only new complaint today is some increased constipation for which she is taking stool  softeners and laxatives affectively it appropriately. She's had no abdominal pain and denies any blood in the stool.  Ana Young  continues to have some intermittent numbness in the very tips of her fingers.  She feels like this has worsened slightly since last week, but is still comes and goes. She is still able to perform fine motor skills such as picking up a thin piece of paper or buttoning a blouse. She has some occasional tingling in the right foot. None of this is affecting her day-to-day activities.  REVIEW OF SYSTEMS: Ana Young has had no illnesses, fevers, chills, or night sweats and denies any rashes or skin changes other than hyperpigmentation on the hands and nailbeds. The nail beds are slightly tender, but there is no drainage or evidence of infection. She continues to have a decreased appetite with some taste aversion, but has had no nausea or emesis.  She denies any change in bladder habits. She has some shortness of breath with exertion. She is trying to exercise more, but is finding it difficult due to some deconditioning. She's had no cough, phlegm production,or chest pain.  She's had no abnormal headaches, change in vision, or dizziness. She denies any current myalgias, arthralgias, or bony pain, and has had no peripheral swelling.   A detailed review of systems is otherwise stable and noncontributory.    PAST MEDICAL HISTORY: Past Medical History  Diagnosis Date  . Depression   . Vitamin D deficiency   . Anxiety   . Seasonal allergies   . HTN (hypertension)   . Insomnia   . History of migraines   . Cancer     Left breast cancer  . Breast cancer   .  Anemia 06/07/2013    PAST SURGICAL HISTORY: Past Surgical History  Procedure Laterality Date  . Bunionectomy Left 2009  . Portacath placement Left 03/09/2013    Procedure: INSERTION PORT-A-CATH;  Surgeon: Ana Young;  Location: [facility];  Service: General;  Laterality: Left;    FAMILY HISTORY Family  History  Problem Relation Age of Onset  . Heart attack Brother   . Congestive Heart Failure Mother   . Stroke Father   . Diabetes Father   . Breast cancer Maternal Grandmother 97   the patient's parents are in their 74s. The patient has one brother, no sisters. The patient's maternal grandmother was diagnosed with breast cancer the age of 71. There is no other history of breast cancer in the family to the patient's knowledge, and no history of ovarian cancer  GYNECOLOGIC HISTORY:  Menarche age 43, first live birth age 61, the patient is GX P2. Her periods are not irregular, but still every 2-3 months, and otherwise unremarkable.  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 Her husband Ana Young works as a Nurse, learning disability. Son Ana Young lives in [place] and works in Scientist, research (medical). Daughter Ana Young is 103 year old and at home. The patient has no grandchildren. She attends the [employer]    ADVANCED DIRECTIVES: Not in place  HEALTH MAINTENANCE:  (Updated December 2014) History  Substance Use Topics  . Smoking status: Never Smoker   . Smokeless tobacco: Never Used  . Alcohol Use: No     Colonoscopy: - Never  PAP: Feb 2012, Dr. Buelah Young  Bone density: - Never  Lipid panel:  Dec 2012, Dr. Zigmund Young   No Known Allergies  Current Outpatient Prescriptions  Medication Sig Dispense Refill  . ALPRAZolam (XANAX) 1 MG tablet Take 1 mg by mouth 2 (two) times daily as needed for sleep (1/2 tablet bid as needed).      Marland Kitchen buPROPion (WELLBUTRIN XL) 300 MG 24 hr tablet TAKE 1 TABLET (300 MG TOTAL) BY MOUTH EVERY MORNING.  30 tablet  3  . docusate sodium (COLACE) 100 MG capsule Take 1-2 capsules (100-200 mg total) by mouth every morning. As directed for constipation  30 capsule  1  . ibuprofen (ADVIL,MOTRIN) 800 MG tablet TAKE 1 TABLET (800 MG TOTAL) BY MOUTH EVERY 8 (EIGHT) HOURS  AS NEEDED FOR PAIN.  30 tablet  2  . lidocaine-prilocaine (EMLA) cream Apply topically as needed. For chemotherapy  30 g  0  . LORazepam (ATIVAN) 0.5 MG tablet Take 1 tablet (0.5 mg total) by mouth at bedtime as needed for anxiety (Nausea or vomiting).  30 tablet  0  . polyethylene glycol powder (MIRALAX) powder Take 17 g by mouth daily as needed (constipation).  255 g  1  . propranolol ER (INDERAL LA) 120 MG 24 hr capsule TAKE 1 CAPSULE (120 MG TOTAL) BY MOUTH DAILY.  93 capsule  1  . zolpidem (AMBIEN) 5 MG tablet TAKE 1 TABLET BY MOUTH AT BEDTIME AS NEEDED FOR SLEEP  30 tablet  0  . ondansetron (ZOFRAN) 8 MG tablet Take 1 tablet (8 mg total) by mouth 2 (two) times daily. 1 tab by mouth twice daily x 2 days after chemo, then 1 tab every 12 hrs PRN nausea  30 tablet  1  . prochlorperazine (COMPAZINE) 10 MG tablet Take 1 tablet (10 mg total) by mouth every 6 (six) hours  as needed for nausea or vomiting.  30 tablet  1   No current facility-administered medications for this visit.   Facility-Administered Medications Ordered in Other Visits  Medication Dose Route Frequency Provider Last Rate Last Dose  . sodium chloride 0.9 % injection 10 mL  10 mL Intracatheter PRN Chauncey Cruel, Young   10 mL at 06/21/13 1322    OBJECTIVE: Middle-aged [place] American woman  in no acute distress Filed Vitals:   07/19/13 1313  BP: 129/80  Pulse: 80  Temp: 97.9 F (36.6 C)  Resp: 18     Body mass index is 21.37 kg/(m^2).    ECOG FS: 0 Filed Weights   07/19/13 1313  Weight: 136 lb 8 oz (61.916 kg)   Physical Exam: HEENT:  Sclerae anicteric.  Oropharynx clear and moist. No ulcerations or evidence of oropharyngeal candidiasis.  Neck is supple. Trachea midline. NODES:  No cervical or supraclavicular lymphadenopathy palpated.  BREAST EXAM: Deferred.  Axillae are benign, no palpable lymphadenopathy. LUNGS:  Clear to auscultation bilaterally with good excursion.  No wheezes, rales, or rhonchi.   HEART:   Regular rate and rhythm.  ABDOMEN:  Soft, nontender to palpation.   Positive, slightly hypoactive bowel sounds.  MSK:  No focal spinal tenderness to palpation. Full range of motion in the upper extremities. No joint swelling. EXTREMITIES:  No peripheral edema.   SKIN:  Port is intact in the left upper chest wall, with no erythema or edema, no evidence of infection/cellulitis. Hyperpigmentation of the skin on the hands and nailbeds bilaterally.   There is some loosening of the nails from the nail beds bilaterally, but no visible drainage or evidence of infection.  NEURO:  Nonfocal. Well oriented.  Appropriate affect.    LAB RESULTS:  Lab Results  Component Value Date   WBC 2.2* 07/19/2013   NEUTROABS 1.1* 07/19/2013   HGB 10.7* 07/19/2013   HCT 32.1* 07/19/2013   MCV 92.0 07/19/2013   PLT 189 07/19/2013      Chemistry      Component Value Date/Time   NA 138 07/05/2013 0842   NA 138 06/16/2011 0938   K 4.0 07/05/2013 0842   K 4.1 06/16/2011 0938   CL 110 06/16/2011 0938   CO2 23 07/05/2013 0842   CO2 26 06/16/2011 0938   BUN 12.7 07/05/2013 0842   BUN 10 06/16/2011 0938   CREATININE 0.7 07/05/2013 0842   CREATININE 0.81 06/16/2011 0938   CREATININE 0.74 07/26/2010 2332      Component Value Date/Time   CALCIUM 9.9 07/05/2013 0842   CALCIUM 9.5 06/16/2011 0938   ALKPHOS 91 07/05/2013 0842   ALKPHOS 78 06/16/2011 0938   AST 16 07/05/2013 0842   AST 13 06/16/2011 0938   ALT 26 07/05/2013 0842   ALT 10 06/16/2011 0938   BILITOT 0.26 07/05/2013 0842   BILITOT 0.5 06/16/2011 0938      STUDIES:  Echocardiogram on 03/08/2013 showed an ejection fraction of 55-60%.   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 Patient is undergoing neoadjuvant chemotherapy. Assess response to neoadjuvant therapy.  EXAM: MR BILATERAL BREAST WITHOUT  AND WITH CONTRAST  TECHNIQUE: Multiplanar, multisequence MR images of both breasts were obtained prior to and following the intravenous administration of 53m of MultiHance.  THREE-DIMENSIONAL MR IMAGE RENDERING ON INDEPENDENT WORKSTATION:  Three-dimensional MR images were rendered by post-processing  of the original MR data on an independent workstation. The three-dimensional MR images were interpreted, and findings are reported in the following complete MRI report for this study.  COMPARISON:  Previous exams  FINDINGS: Breast composition: c:  Heterogeneous fibroglandular tissue  Background parenchymal enhancement: Mild to moderate.  Right breast: The previously described areas of clumped linear enhancement located within the central right breast which were shown to represent fibrocystic change on MR guided biopsies appear stable. There are no new enhancing foci within the right breast.  Left breast: The previously seen adjacent enhancing masses located within the superior left breast are no longer visible by MR and there is no abnormal enhancement in this region. There are no new enhancing foci.  Lymph nodes: No abnormal appearing lymph nodes.  Ancillary findings:  None.  IMPRESSION: Excellent response to neoadjuvant chemotherapy with no residual enhancing masses visible within the superior left breast. The study is otherwise unchanged.  RECOMMENDATION: Treatment plan.  BI-RADS CATEGORY  6: Known biopsy-proven malignancy - appropriate action should be taken.   Electronically Signed   By: Luberta Robertson M.D.   On: 05/02/2013 15:46     ASSESSMENT: 53 y.o. 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    (2) MRI on 03/02/2013 showed an additional nodule at the 2:00 position, 5 mm lateral to the primary in the left breast, measuring 10 x 6 x 9 mm. This was  biopsied on 03/11/2013 and was consistent with high-grade carcinoma [phone]). Prognostic panel is pending.   (3) MRI on 03/02/2013 also showed 2 areas of irregularity in the right breast which were worrisome for possible DCIS. Both of these areas were biopsied on 03/04/2013, and both showed fibrocystic changes with no evidence of malignancy ([id number]).   (4) genetics testing is pending  (5)  treated in the neoadjuvant setting with   (a) 4 dose dense cycles of doxorubicin/ cyclophosphamide, with the first dose given on 03/15/2013 and the final one 04/26/2013, with a complete radiologic response  (b) followed by 12 weekly doses of carboplatin/paclitaxel started 05/17/2013, given in standard dose, with all neoadjuvant chemotherapy to be completed prior to definitive surgery.   PLAN: Desirre will proceed to treatment today as scheduled for her 10th weekly dose of carboplatin/paclitaxel. She has 2 weekly treatments remaining to complete her neoadjuvant chemotherapy. We will continue to follow her very closely on a weekly basis, especially for any increased peripheral neuropathy. She's already scheduled for repeat breast MRI on January 29 to assess response to therapy, and will Young Dr. Jana Hakim the following week on February 3 to review those results. She scheduled to undergo surgery on February 10 with Dr. Cher Nakai.  As previously noted, Crystin tends to have a slightly reduced ANC, but we will continue to treat as long as her Clinton is 1.0 or above. This will continue to be checked on a weekly basis, and I have also alerted our pharmacy  with regards to these guidelines.   I will Young  Mazi next week on January 20 prior to her 11th dose of carboplatin/paclitaxel. She voices understanding and agreement with our plan, and knows to call prior that appointment with any changes or problems.   Micah Flesher, PA-C   07/19/2013 1:46 PM

## 2013-07-19 NOTE — Patient Instructions (Signed)
[facility] Discharge Instructions for Patients Receiving Chemotherapy  Today you received the following chemotherapy agents Taxol/Carboplatin To help prevent nausea and vomiting after your treatment, we encourage you to take your nausea medication as prescribed.  If you develop nausea and vomiting that is not controlled by your nausea medication, call the clinic.   BELOW ARE SYMPTOMS THAT SHOULD BE REPORTED IMMEDIATELY:  *FEVER GREATER THAN 100.5 F  *CHILLS WITH OR WITHOUT FEVER  NAUSEA AND VOMITING THAT IS NOT CONTROLLED WITH YOUR NAUSEA MEDICATION  *UNUSUAL SHORTNESS OF BREATH  *UNUSUAL BRUISING OR BLEEDING  TENDERNESS IN MOUTH AND THROAT WITH OR WITHOUT PRESENCE OF ULCERS  *URINARY PROBLEMS  *BOWEL PROBLEMS  UNUSUAL RASH Items with * indicate a potential emergency and should be followed up as soon as possible.  Feel free to call the clinic you have any questions or concerns. The clinic phone number is [phone].    

## 2013-07-26 ENCOUNTER — Other Ambulatory Visit: Payer: Self-pay | Admitting: Family Medicine

## 2013-07-26 ENCOUNTER — Ambulatory Visit (HOSPITAL_BASED_OUTPATIENT_CLINIC_OR_DEPARTMENT_OTHER): Payer: BC Managed Care – PPO | Admitting: Physician Assistant

## 2013-07-26 ENCOUNTER — Encounter: Payer: Self-pay | Admitting: Physician Assistant

## 2013-07-26 ENCOUNTER — Other Ambulatory Visit (HOSPITAL_BASED_OUTPATIENT_CLINIC_OR_DEPARTMENT_OTHER): Payer: BC Managed Care – PPO

## 2013-07-26 ENCOUNTER — Ambulatory Visit: Payer: BC Managed Care – PPO

## 2013-07-26 VITALS — BP 124/82 | HR 77 | Temp 98.0°F | Resp 18 | Ht 67.0 in | Wt 138.5 lb

## 2013-07-26 DIAGNOSIS — T451X5A Adverse effect of antineoplastic and immunosuppressive drugs, initial encounter: Secondary | ICD-10-CM

## 2013-07-26 DIAGNOSIS — D649 Anemia, unspecified: Secondary | ICD-10-CM

## 2013-07-26 DIAGNOSIS — D709 Neutropenia, unspecified: Secondary | ICD-10-CM

## 2013-07-26 DIAGNOSIS — B351 Tinea unguium: Secondary | ICD-10-CM | POA: Insufficient documentation

## 2013-07-26 DIAGNOSIS — C50419 Malignant neoplasm of upper-outer quadrant of unspecified female breast: Secondary | ICD-10-CM

## 2013-07-26 DIAGNOSIS — C50412 Malignant neoplasm of upper-outer quadrant of left female breast: Secondary | ICD-10-CM

## 2013-07-26 DIAGNOSIS — F411 Generalized anxiety disorder: Secondary | ICD-10-CM

## 2013-07-26 DIAGNOSIS — D701 Agranulocytosis secondary to cancer chemotherapy: Secondary | ICD-10-CM

## 2013-07-26 LAB — CBC WITH DIFFERENTIAL/PLATELET
BASO%: 0.6 % (ref 0.0–2.0)
Basophils Absolute: 0 10*3/uL (ref 0.0–0.1)
EOS%: 0 % (ref 0.0–7.0)
Eosinophils Absolute: 0 10*3/uL (ref 0.0–0.5)
HEMATOCRIT: 29.1 % — AB (ref 34.8–46.6)
HGB: 9.8 g/dL — ABNORMAL LOW (ref 11.6–15.9)
LYMPH#: 0.8 10*3/uL — AB (ref 0.9–3.3)
LYMPH%: 47.6 % (ref 14.0–49.7)
MCH: 30.8 pg (ref 25.1–34.0)
MCHC: 33.7 g/dL (ref 31.5–36.0)
MCV: 91.5 fL (ref 79.5–101.0)
MONO#: 0.1 10*3/uL (ref 0.1–0.9)
MONO%: 3 % (ref 0.0–14.0)
NEUT#: 0.8 10*3/uL — ABNORMAL LOW (ref 1.5–6.5)
NEUT%: 48.8 % (ref 38.4–76.8)
Platelets: 199 10*3/uL (ref 145–400)
RBC: 3.18 10*6/uL — ABNORMAL LOW (ref 3.70–5.45)
RDW: 16 % — ABNORMAL HIGH (ref 11.2–14.5)
WBC: 1.7 10*3/uL — AB (ref 3.9–10.3)

## 2013-07-26 MED ORDER — PROPRANOLOL HCL ER 120 MG PO CP24: 120.0000 mg | ORAL_CAPSULE | Freq: Every day | ORAL | Status: DC

## 2013-07-26 MED ORDER — CIPROFLOXACIN HCL 500 MG PO TABS: 500.0000 mg | ORAL_TABLET | Freq: Two times a day (BID) | ORAL | Status: DC

## 2013-07-26 MED ORDER — FLUCONAZOLE 100 MG PO TABS: 100.0000 mg | ORAL_TABLET | Freq: Every day | ORAL | Status: DC

## 2013-07-27 NOTE — Progress Notes (Signed)
ID: Ana Young OB: 20-Jun-1961  MR#: [id number]  CSN#:[id number]  PCP: Christa See, MD GYN:   SU: Stark Klein OTHER MD: Thea Silversmith, Dalbert Mayotte  CHIEF COMPLAINT:  Left  Breast Cancer/Neoadjuvant Chemotherapy  HISTORY OF PRESENT ILLNESS: Ana Young herself palpated a mass in her left breast 01/15/2013 and brought it to the attention of Dr. Lindell Noe, who arranged for her to have bilateral digital mammography at the [facility] Center 01/27/2013. The patient does have heterogeneously dense breasts. In the outer upper left breast there was an oval mass by mammography measuring approximately 1.5 cm, which by palpation was firm and measured approximately 1 cm. Ultrasound showed this to be hypoechoic and to measure 1.4 cm.  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  The patient then underwent bilateral breast MRI 02/28/2013. In the left breast upper-outer quadrant the mass in question measured 2.9 cm. There was a second mass lateral to it measuring 1.0 cm. There were no additional abnormal appearing lymph nodes and no evidence of internal mammary adenopathy.  In the right breast there were 2 areas of irregular enhancement in the superior central third of the breast, both measuring 9 mm. These were felt to be worrisome for a ductal carcinoma in situ.  The patient's case was discussed at the multidisciplinary breast cancer conference 03/02/2013. Her subsequent history is as detailed below.  INTERVAL HISTORY: Shaton returns today accompanied by her husband Louie Casa for followup of her locally advanced left breast carcinoma. She is due for her 11th of 12 planned weekly doses of neoadjuvant carboplatin/paclitaxel today.    Overall she has been tolerating this regimen well. She has some blurred vision associated with the steroids. She still has a little bit of  a runny nose. She has mild fatigue. 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 It still comes and goes, but is slightly worsened compared to one week ago. She still able to perform fine motor skills and so far this has not affected any of her day-to-day activities.  [name] tends to run a below normal ANC, and our guideline has been to treat her as long as her ANC is 1.0 or above. Unfortunately it has finally dropped below 1.0 today at 0.8.  She denies any fevers or chills.   REVIEW OF SYSTEMS: Ana Young has had no illnesses and denies any rashes or skin changes other than hyperpigmentation on the hands and nailbeds. She has had some increase in no dyscrasia. Some of the nails are loosening slightly from the nail beds, and she has started to notice some drainage from beneath the nails. She denies any mouth ulcers, oral sensitivity, or problems swallowing. She is eating well, and keeping herself well hydrated. She denies any nausea or emesis and has had no change in bowel or bladder habits. She has some shortness of breath with exertion which is stable.  She's had no cough, phlegm production,or chest pain.  She's had no abnormal headaches or dizziness. She denies any current myalgias, arthralgias, or bony pain, and has had no peripheral swelling.   A detailed review of systems is otherwise stable and noncontributory.    PAST MEDICAL HISTORY: Past Medical History  Diagnosis Date  . Depression   . Vitamin D deficiency   . Anxiety   . Seasonal allergies   . HTN (hypertension)   . Insomnia   . History of  migraines   . Cancer     Left breast cancer  . Breast cancer   . Anemia 06/07/2013    PAST SURGICAL HISTORY: Past Surgical History  Procedure Laterality Date  . Bunionectomy Left 2009  . Portacath placement Left 03/09/2013    Procedure: INSERTION PORT-A-CATH;  Surgeon: Stark Klein, MD;  Location: [facility];  Service: General;  Laterality: Left;     FAMILY HISTORY Family History  Problem Relation Age of Onset  . Heart attack Brother   . Congestive Heart Failure Mother   . Stroke Father   . Diabetes Father   . Breast cancer Maternal Grandmother 97   the patient's parents are in their 83s. The patient has one brother, no sisters. The patient's maternal grandmother was diagnosed with breast cancer the age of 32. There is no other history of breast cancer in the family to the patient's knowledge, and no history of ovarian cancer  GYNECOLOGIC HISTORY:  Menarche age 69, first live birth age 27, the patient is GX P2. Her periods are irregular, but still every 2-3 months, and otherwise unremarkable.  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  She is currently not working. Her husband Louie Casa works as a Nurse, learning disability. Son Dory Larsen lives in [place] and works in Scientist, research (medical). Daughter Devani Odonnel is 33 year old and at home. The patient has no grandchildren. She attends the [employer]    ADVANCED DIRECTIVES: Not in place  HEALTH MAINTENANCE:  (Updated January 2015) History  Substance Use Topics  . Smoking status: Never Smoker   . Smokeless tobacco: Never Used  . Alcohol Use: No     Colonoscopy: - Never  PAP: Feb 2012, Dr. Buelah Manis  Bone density: - Never  Lipid panel:  Dec 2012, Dr. Zigmund Daniel   No Known Allergies  Current Outpatient Prescriptions  Medication Sig Dispense Refill  . ALPRAZolam (XANAX) 1 MG tablet Take 1 mg by mouth 2 (two) times daily as needed for sleep (1/2 tablet bid as needed).      . docusate sodium (COLACE) 100 MG capsule Take 1-2 capsules (100-200 mg total) by mouth every morning. As directed for constipation  30 capsule  1  . ibuprofen (ADVIL,MOTRIN) 800 MG tablet TAKE 1 TABLET (800 MG TOTAL) BY MOUTH EVERY 8 (EIGHT) HOURS AS NEEDED FOR PAIN.  30 tablet  2  . lidocaine-prilocaine  (EMLA) cream Apply topically as needed. For chemotherapy  30 g  0  . LORazepam (ATIVAN) 0.5 MG tablet Take 1 tablet (0.5 mg total) by mouth at bedtime as needed for anxiety (Nausea or vomiting).  30 tablet  0  . ondansetron (ZOFRAN) 8 MG tablet Take 1 tablet (8 mg total) by mouth 2 (two) times daily. 1 tab by mouth twice daily x 2 days after chemo, then 1 tab every 12 hrs PRN nausea  30 tablet  1  . polyethylene glycol powder (MIRALAX) powder Take 17 g by mouth daily as needed (constipation).  255 g  1  . zolpidem (AMBIEN) 5 MG tablet TAKE 1 TABLET BY MOUTH AT BEDTIME AS NEEDED FOR SLEEP  30 tablet  0  . buPROPion (WELLBUTRIN XL) 300 MG 24 hr tablet Take 1 tablet (300 mg total) by mouth daily.  30 tablet  3  . ciprofloxacin (CIPRO) 500 MG tablet Take 1 tablet (500 mg total) by mouth 2 (two) times daily. X  5 days  10 tablet  0  . fluconazole (DIFLUCAN) 100 MG tablet Take 1 tablet (100 mg total) by mouth daily.  7 tablet  0  . prochlorperazine (COMPAZINE) 10 MG tablet Take 1 tablet (10 mg total) by mouth every 6 (six) hours as needed for nausea or vomiting.  30 tablet  1  . propranolol ER (INDERAL LA) 120 MG 24 hr capsule Take 1 capsule (120 mg total) by mouth daily.  93 capsule  1   No current facility-administered medications for this visit.   Facility-Administered Medications Ordered in Other Visits  Medication Dose Route Frequency Provider Last Rate Last Dose  . sodium chloride 0.9 % injection 10 mL  10 mL Intracatheter PRN Chauncey Cruel, MD   10 mL at 06/21/13 1322    OBJECTIVE: Middle-aged [place] American woman  in no acute distress Filed Vitals:   07/26/13 1109  BP: 124/82  Pulse: 77  Temp: 98 F (36.7 C)  Resp: 18     Body mass index is 21.69 kg/(m^2).    ECOG FS: 0 Filed Weights   07/26/13 1109  Weight: 138 lb 8 oz (62.823 kg)   Physical Exam: HEENT:  Sclerae anicteric.  Oropharynx clear. No ulcerations or evidence of oropharyngeal candidiasis.  Neck is supple. Trachea  midline. NODES:  No cervical or supraclavicular lymphadenopathy palpated.  BREAST EXAM: Deferred.  Axillae are benign, no palpable lymphadenopathy. LUNGS:  Clear to auscultation bilaterally with good excursion.  No wheezes, rales, or rhonchi.   HEART:  Regular rate and rhythm. No murmur appreciated. ABDOMEN:  Soft, nontender to palpation. No organomegaly or masses palpated.  Positive bowel sounds.  MSK:  No focal spinal tenderness to palpation. Full range of motion in the upper extremities. No joint swelling. EXTREMITIES:  No peripheral edema.   SKIN:  Port is intact in the left upper chest wall, with no erythema or edema, no evidence of infection/cellulitis. Hyperpigmentation of the skin on the hands and nailbeds bilaterally.   There is some loosening of the nails from the nail beds bilaterally, currently with no visible drainage. NEURO:  Nonfocal. Well oriented.  Appropriate affect.    LAB RESULTS:  Lab Results  Component Value Date   WBC 1.7* 07/26/2013   NEUTROABS 0.8* 07/26/2013   HGB 9.8* 07/26/2013   HCT 29.1* 07/26/2013   MCV 91.5 07/26/2013   PLT 199 07/26/2013      Chemistry      Component Value Date/Time   NA 139 07/19/2013 1242   NA 138 06/16/2011 0938   K 4.0 07/19/2013 1242   K 4.1 06/16/2011 0938   CL 110 06/16/2011 0938   CO2 27 07/19/2013 1242   CO2 26 06/16/2011 0938   BUN 10.7 07/19/2013 1242   BUN 10 06/16/2011 0938   CREATININE 0.7 07/19/2013 1242   CREATININE 0.81 06/16/2011 0938   CREATININE 0.74 07/26/2010 2332      Component Value Date/Time   CALCIUM 9.7 07/19/2013 1242   CALCIUM 9.5 06/16/2011 0938   ALKPHOS 88 07/19/2013 1242   ALKPHOS 78 06/16/2011 0938   AST 14 07/19/2013 1242   AST 13 06/16/2011 0938   ALT 17 07/19/2013 1242   ALT 10 06/16/2011 0938   BILITOT 0.28 07/19/2013 1242   BILITOT 0.5 06/16/2011 0938      STUDIES:  Echocardiogram on 03/08/2013 showed an ejection fraction of 55-60%.   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 Patient is undergoing neoadjuvant chemotherapy. Assess response to neoadjuvant therapy.  EXAM: MR BILATERAL BREAST WITHOUT AND WITH CONTRAST  TECHNIQUE: Multiplanar, multisequence MR images of both breasts were obtained prior to and following the intravenous administration of 42m of MultiHance.  THREE-DIMENSIONAL MR IMAGE RENDERING ON INDEPENDENT WORKSTATION:  Three-dimensional MR images were rendered by post-processing of the original MR data on an independent workstation. The three-dimensional MR images were interpreted, and findings are reported in the following complete MRI report for this study.  COMPARISON:  Previous exams  FINDINGS: Breast composition: c:  Heterogeneous fibroglandular tissue  Background parenchymal enhancement: Mild to moderate.  Right breast: The previously described areas of clumped linear enhancement located within the central right breast which were shown to represent fibrocystic change on MR guided biopsies appear stable. There are no new enhancing foci within the right breast.  Left breast: The previously seen adjacent enhancing masses located within the superior left breast are no longer visible by MR and there is no abnormal enhancement in this region. There are no new enhancing foci.  Lymph nodes: No abnormal appearing lymph nodes.  Ancillary findings:  None.  IMPRESSION: Excellent response to neoadjuvant chemotherapy with no residual enhancing masses visible within the superior left breast. The study is otherwise unchanged.  RECOMMENDATION: Treatment plan.  BI-RADS CATEGORY  6: Known biopsy-proven malignancy - appropriate action should be taken.   Electronically Signed   By: RLuberta RobertsonM.D.   On: 05/02/2013 15:46     ASSESSMENT: 53y.o. GFernand Parkinswoman   (1)  status post left breast upper outer quadrant biopsy 02/22/2013 for a  clinical T2 N0, stage IIA invasive ductal carcinoma, grade 3, triple negative, with an MIB-1 of 100%.    (2) MRI on 03/02/2013 showed an additional nodule at the 2:00 position, 5 mm lateral to the primary in the left breast, measuring 10 x 6 x 9 mm. This was biopsied on 03/11/2013 and was consistent with high-grade carcinoma [phone]. Prognostic panel is pending.   (3) MRI on 03/02/2013 also showed 2 areas of irregularity in the right breast which were worrisome for possible DCIS. Both of these areas were biopsied on 03/04/2013, and both showed fibrocystic changes with no evidence of malignancy (([id number]   (4) genetics testing is pending  (5)  treated in the neoadjuvant setting with   (a) 4 dose dense cycles of doxorubicin/ cyclophosphamide, with the first dose given on 03/15/2013 and the final one 04/26/2013, with a complete radiologic response  (b) followed by 12 weekly doses of carboplatin/paclitaxel started 05/17/2013, given in standard dose, with all neoadjuvant chemotherapy to be completed prior to definitive surgery.   PLAN: Due to a reduced ANC of 0.8, we will hold Annaliza's 11th cycle of carboplatin/paclitaxel today. We reviewed neutropenic precautions, and I am starting her on a brief course of Cipro prophylactically.  I am also starting her on a course of fluconazole, 100 mg daily for one week for the nail dyscrasia and likely development of mild onychomycosis.  I will see DFrankyeagain next week on January 27. We will simply skip tpday's cycle, and will plan on next weeks cycle being her final neoadjuvant treatment. Of course we will reassess her counts at that time, and we'll also follow her closely for any increased peripheral neuropathy.   She is already scheduled for her repeat Breast MRI on January 29. She'll see Dr. MJana Hakimon February 3 to review  those results, and is already scheduled for her lumpectomy under the care of Dr. Barry Dienes on February 10.   We have reviewed  this plan in detail today, and Nazareth voices her understanding. She is very much in favor of today's plan. She knows to call she has any fevers of 100 or above. She also knows to call with any additional changes or problems prior to her appointment here next week.    Micah Flesher, PA-C   07/26/2013

## 2013-07-28 ENCOUNTER — Other Ambulatory Visit: Payer: Self-pay | Admitting: Family Medicine

## 2013-07-29 ENCOUNTER — Ambulatory Visit (INDEPENDENT_AMBULATORY_CARE_PROVIDER_SITE_OTHER): Payer: BC Managed Care – PPO | Admitting: General Surgery

## 2013-07-29 ENCOUNTER — Telehealth (HOSPITAL_COMMUNITY): Payer: Self-pay | Admitting: Family Medicine

## 2013-07-29 MED ORDER — ZOLPIDEM TARTRATE 5 MG PO TABS: 5.0000 mg | ORAL_TABLET | Freq: Every evening | ORAL | Status: DC | PRN

## 2013-07-29 NOTE — Telephone Encounter (Signed)
Pt notified.  Tasmin Exantus L, CMA  

## 2013-07-29 NOTE — Telephone Encounter (Signed)
Received Rx request for Ambien 5 mg qHS PRN. Rx for #30 with 3 refills left at front desk. Please call pt to let her know she can pick this up at any time. Thanks! --CMS

## 2013-08-02 ENCOUNTER — Ambulatory Visit: Payer: BC Managed Care – PPO

## 2013-08-02 ENCOUNTER — Telehealth: Payer: Self-pay | Admitting: Oncology

## 2013-08-02 ENCOUNTER — Telehealth: Payer: Self-pay | Admitting: *Deleted

## 2013-08-02 ENCOUNTER — Ambulatory Visit (HOSPITAL_BASED_OUTPATIENT_CLINIC_OR_DEPARTMENT_OTHER): Payer: BC Managed Care – PPO | Admitting: Hematology and Oncology

## 2013-08-02 ENCOUNTER — Ambulatory Visit (HOSPITAL_BASED_OUTPATIENT_CLINIC_OR_DEPARTMENT_OTHER): Payer: BC Managed Care – PPO

## 2013-08-02 ENCOUNTER — Other Ambulatory Visit (HOSPITAL_BASED_OUTPATIENT_CLINIC_OR_DEPARTMENT_OTHER): Payer: BC Managed Care – PPO

## 2013-08-02 VITALS — BP 111/75 | HR 86 | Temp 98.2°F | Resp 18 | Ht 67.0 in | Wt 134.0 lb

## 2013-08-02 DIAGNOSIS — D702 Other drug-induced agranulocytosis: Secondary | ICD-10-CM

## 2013-08-02 DIAGNOSIS — L608 Other nail disorders: Secondary | ICD-10-CM

## 2013-08-02 DIAGNOSIS — C50412 Malignant neoplasm of upper-outer quadrant of left female breast: Secondary | ICD-10-CM

## 2013-08-02 DIAGNOSIS — C50419 Malignant neoplasm of upper-outer quadrant of unspecified female breast: Secondary | ICD-10-CM

## 2013-08-02 DIAGNOSIS — D709 Neutropenia, unspecified: Secondary | ICD-10-CM

## 2013-08-02 DIAGNOSIS — Z171 Estrogen receptor negative status [ER-]: Secondary | ICD-10-CM

## 2013-08-02 DIAGNOSIS — D72819 Decreased white blood cell count, unspecified: Secondary | ICD-10-CM

## 2013-08-02 HISTORY — DX: Other drug-induced agranulocytosis: D70.2

## 2013-08-02 LAB — CBC WITH DIFFERENTIAL/PLATELET
BASO%: 0.5 % (ref 0.0–2.0)
BASOS ABS: 0 10*3/uL (ref 0.0–0.1)
EOS ABS: 0 10*3/uL (ref 0.0–0.5)
EOS%: 0 % (ref 0.0–7.0)
HEMATOCRIT: 34.6 % — AB (ref 34.8–46.6)
HEMOGLOBIN: 11.5 g/dL — AB (ref 11.6–15.9)
LYMPH%: 45.1 % (ref 14.0–49.7)
MCH: 30.3 pg (ref 25.1–34.0)
MCHC: 33.2 g/dL (ref 31.5–36.0)
MCV: 91.3 fL (ref 79.5–101.0)
MONO#: 0.4 10*3/uL (ref 0.1–0.9)
MONO%: 19.2 % — AB (ref 0.0–14.0)
NEUT%: 35.2 % — AB (ref 38.4–76.8)
NEUTROS ABS: 0.6 10*3/uL — AB (ref 1.5–6.5)
PLATELETS: 261 10*3/uL (ref 145–400)
RBC: 3.79 10*6/uL (ref 3.70–5.45)
RDW: 16.3 % — ABNORMAL HIGH (ref 11.2–14.5)
WBC: 1.8 10*3/uL — ABNORMAL LOW (ref 3.9–10.3)
lymph#: 0.8 10*3/uL — ABNORMAL LOW (ref 0.9–3.3)
nRBC: 0 % (ref 0–0)

## 2013-08-02 MED ORDER — FILGRASTIM 480 MCG/0.8ML IJ SOLN
480.0000 ug | Freq: Once | INTRAMUSCULAR | Status: AC
  Administered 2013-08-02: 480 ug via SUBCUTANEOUS
  Filled 2013-08-02: qty 0.8

## 2013-08-02 NOTE — Telephone Encounter (Signed)
Per staff message and POF I have scheduled appts.  JMW  

## 2013-08-02 NOTE — Progress Notes (Signed)
ID: [name] OB: 02/11/1961  MR#: [id number]  CSN#:[id number]  PCP: Christa See, MD GYN:   SU: Stark Klein OTHER MD: Thea Silversmith, Dalbert Mayotte  CHIEF COMPLAINT:  Left  Breast Cancer/Neoadjuvant Chemotherapy  HISTORY OF PRESENT ILLNESS: Zariana herself palpated a mass in her left breast 01/15/2013 and brought it to the attention of Dr. Lindell Noe, who arranged for her to have bilateral digital mammography at the [facility] Center 01/27/2013. The patient does have heterogeneously dense breasts. In the outer upper left breast there was an oval mass by mammography measuring approximately 1.5 cm, which by palpation was firm and measured approximately 1 cm. Ultrasound showed this to be hypoechoic and to measure 1.4 cm.  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  The patient then underwent bilateral breast MRI 02/28/2013. In the left breast upper-outer quadrant the mass in question measured 2.9 cm. There was a second mass lateral to it measuring 1.0 cm. There were no additional abnormal appearing lymph nodes and no evidence of internal mammary adenopathy.  In the right breast there were 2 areas of irregular enhancement in the superior central third of the breast, both measuring 9 mm. These were felt to be worrisome for a ductal carcinoma in situ.  The patient's case was discussed at the multidisciplinary breast cancer conference 03/02/2013. Her subsequent history is as detailed below.  INTERVAL HISTORY: Allison returns today accompanied by her husband Louie Casa for followup of her locally advanced left breast carcinoma. She is due for her 11th of 12 planned weekly doses of neoadjuvant carboplatin/paclitaxel today.    Last week chemotherapy was held in view of  neutropenia. She does not complain of any fever, shortness of breath ,chills, or sore throat.  She does complain of altered taste.  Her [place] is persistently low  with ANC of 600 and total WBC of 1800    Review of systems:   She denies any mouth ulcers, oral sensitivity, or problems swallowing. She is eating well, and keeping herself well hydrated. She denies any nausea or emesis and has had no change in bowel or bladder habits. She's had no cough, phlegm production,or chest pain.  She's had no abnormal headaches or dizziness. She denies any current myalgias, arthralgias, or bony pain, and has had no peripheral swelling.  Drainage from the underneath the nails getting better   A detailed review of systems is otherwise stable and noncontributory.    PAST MEDICAL HISTORY: Past Medical History  Diagnosis Date  . Depression   . Vitamin D deficiency   . Anxiety   . Seasonal allergies   . HTN (hypertension)   . Insomnia   . History of migraines   . Cancer     Left breast cancer  . Breast cancer   . Anemia 06/07/2013  . Drug induced neutropenia(288.03) 08/02/2013    PAST SURGICAL HISTORY: Past Surgical History  Procedure Laterality Date  . Bunionectomy Left 2009  . Portacath placement Left 03/09/2013    Procedure: INSERTION PORT-A-CATH;  Surgeon: Stark Klein, MD;  Location: [facility];  Service: General;  Laterality: Left;    FAMILY HISTORY Family History  Problem Relation Age of Onset  . Heart attack Brother   . Congestive Heart Failure Mother   . Stroke Father   . Diabetes Father   . Breast cancer Maternal Grandmother 97   the patient's parents are in  their 70s. The patient has one brother, no sisters. The patient's maternal grandmother was diagnosed with breast cancer the age of 97. There is no other history of breast cancer in the family to the patient's knowledge, and no history of ovarian cancer  GYNECOLOGIC HISTORY:  Menarche age 14, first live birth age 22, the patient is GX P2. Her periods are irregular, but still every 2-3 months, and otherwise  unremarkable.  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  She is currently not working. Her husband Randy works as a pharmaceutical technician. Son Joshua Ladon Warren lives in [place] and works in retail. Daughter jalenne Pilar Sherry-Johnson is 51 year old and at home. The patient has no grandchildren. She attends the [employer]    ADVANCED DIRECTIVES: Not in place  HEALTH MAINTENANCE:  (Updated January 2015) History  Substance Use Topics  . Smoking status: Never Smoker   . Smokeless tobacco: Never Used  . Alcohol Use: No     Colonoscopy: - Never  PAP: Feb 2012, Dr. Teton  Bone density: - Never  Lipid panel:  Dec 2012, Dr. Matthews   No Known Allergies  Current Outpatient Prescriptions  Medication Sig Dispense Refill  . buPROPion (WELLBUTRIN XL) 300 MG 24 hr tablet Take 1 tablet (300 mg total) by mouth daily.  30 tablet  3  . lidocaine-prilocaine (EMLA) cream Apply topically as needed. For chemotherapy  30 g  0  . LORazepam (ATIVAN) 0.5 MG tablet Take 1 tablet (0.5 mg total) by mouth at bedtime as needed for anxiety (Nausea or vomiting).  30 tablet  0  . prochlorperazine (COMPAZINE) 10 MG tablet Take 1 tablet (10 mg total) by mouth every 6 (six) hours as needed for nausea or vomiting.  30 tablet  1  . propranolol ER (INDERAL LA) 120 MG 24 hr capsule Take 1 capsule (120 mg total) by mouth daily.  93 capsule  1  . zolpidem (AMBIEN) 5 MG tablet Take 1 tablet (5 mg total) by mouth at bedtime as needed for sleep.  30 tablet  3  . ALPRAZolam (XANAX) 1 MG tablet Take 1 mg by mouth 2 (two) times daily as needed for sleep (1/2 tablet bid as needed).      . docusate sodium (COLACE) 100 MG capsule Take 1-2 capsules (100-200 mg total) by mouth every morning. As directed for constipation  30 capsule  1  . ibuprofen (ADVIL,MOTRIN) 800 MG tablet TAKE 1 TABLET (800 MG TOTAL) BY MOUTH EVERY 8 (EIGHT)  HOURS AS NEEDED FOR PAIN.  30 tablet  2  . ondansetron (ZOFRAN) 8 MG tablet Take 1 tablet (8 mg total) by mouth 2 (two) times daily. 1 tab by mouth twice daily x 2 days after chemo, then 1 tab every 12 hrs PRN nausea  30 tablet  1  . polyethylene glycol powder (MIRALAX) powder Take 17 g by mouth daily as needed (constipation).  255 g  1   No current facility-administered medications for this visit.   Facility-Administered Medications Ordered in Other Visits  Medication Dose Route Frequency Provider Last Rate Last Dose  . sodium chloride 0.9 % injection 10 mL  10 mL Intracatheter PRN Gustav C Magrinat, MD   10 mL at 06/21/13 1322    OBJECTIVE: Middle-aged African American woman  in no acute distress Filed Vitals:   08/02/13 0849  BP: 111/75  Pulse: 86  Temp: 98.2   F (36.8 C)  Resp: 18     Body mass index is 20.98 kg/(m^2).    ECOG FS: 0 Filed Weights   08/02/13 0849  Weight: 134 lb (60.782 kg)   Physical Exam: HEENT:  PERRLA,  Sclerae anicteric.  Oropharynx clear. No ulcerations or evidence of oropharyngeal candidiasis.  Neck is supple. Trachea midline. NODES:  No cervical or supraclavicular lymphadenopathy palpated.  BREAST EXAM:  no masses felt. Axillae are benign, no palpable lymphadenopathy. LUNGS:  Clear to auscultation bilaterally with good excursion.  No wheezes, rales, or rhonchi.   HEART:  Regular rate and rhythm. No murmur appreciated. ABDOMEN:  Soft, nontender to palpation. No organomegaly or masses palpated.  Positive bowel sounds.  MSK:  No focal spinal tenderness to palpation. Full range of motion in the upper extremities. No joint swelling. EXTREMITIES:  No peripheral edema.   SKIN:  Port is intact in the left upper chest wall, with no erythema or edema, no evidence of infection/cellulitis. Hyperpigmentation of the skin on the hands and nailbeds bilaterally.   There is some loosening of the nails from the nail beds bilaterally, currently with no visible drainage. NEURO:   Nonfocal. Well oriented.  Appropriate affect.    LAB RESULTS:  Lab Results  Component Value Date   WBC 1.8* 08/02/2013   NEUTROABS 0.6* 08/02/2013   HGB 11.5* 08/02/2013   HCT 34.6* 08/02/2013   MCV 91.3 08/02/2013   PLT 261 08/02/2013      Chemistry      Component Value Date/Time   NA 139 07/19/2013 1242   NA 138 06/16/2011 0938   K 4.0 07/19/2013 1242   K 4.1 06/16/2011 0938   CL 110 06/16/2011 0938   CO2 27 07/19/2013 1242   CO2 26 06/16/2011 0938   BUN 10.7 07/19/2013 1242   BUN 10 06/16/2011 0938   CREATININE 0.7 07/19/2013 1242   CREATININE 0.81 06/16/2011 0938   CREATININE 0.74 07/26/2010 2332      Component Value Date/Time   CALCIUM 9.7 07/19/2013 1242   CALCIUM 9.5 06/16/2011 0938   ALKPHOS 88 07/19/2013 1242   ALKPHOS 78 06/16/2011 0938   AST 14 07/19/2013 1242   AST 13 06/16/2011 0938   ALT 17 07/19/2013 1242   ALT 10 06/16/2011 0938   BILITOT 0.28 07/19/2013 1242   BILITOT 0.5 06/16/2011 0938      STUDIES:  Echocardiogram on 03/08/2013 showed an ejection fraction of 55-60%.   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 Patient is undergoing neoadjuvant chemotherapy. Assess response to neoadjuvant therapy.  EXAM: MR BILATERAL BREAST WITHOUT AND WITH CONTRAST  TECHNIQUE: Multiplanar, multisequence MR images of both breasts were obtained prior to and following the intravenous administration of 13ml of MultiHance.  THREE-DIMENSIONAL MR IMAGE RENDERING ON INDEPENDENT WORKSTATION:  Three-dimensional MR images were rendered by post-processing of the original MR data on an independent workstation. The three-dimensional MR images were interpreted, and findings are reported in the following complete MRI report for this study.  COMPARISON:  Previous exams  FINDINGS: Breast composition: c:  Heterogeneous fibroglandular tissue   Background parenchymal enhancement: Mild to moderate.  Right breast: The previously described areas of clumped linear enhancement located within the central right breast which were shown to represent fibrocystic change on MR guided biopsies appear stable. There are no new enhancing foci within the right breast.  Left   breast: The previously seen adjacent enhancing masses located within the superior left breast are no longer visible by MR and there is no abnormal enhancement in this region. There are no new enhancing foci.  Lymph nodes: No abnormal appearing lymph nodes.  Ancillary findings:  None.  IMPRESSION: Excellent response to neoadjuvant chemotherapy with no residual enhancing masses visible within the superior left breast. The study is otherwise unchanged.  RECOMMENDATION: Treatment plan.  BI-RADS CATEGORY  6: Known biopsy-proven malignancy - appropriate action should be taken.   Electronically Signed   By: Luberta Robertson M.D.   On: 05/02/2013 15:46     ASSESSMENT: 53 y.o. 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    (2) MRI on 03/02/2013 showed an additional nodule at the 2:00 position, 5 mm lateral to the primary in the left breast, measuring 10 x 6 x 9 mm. This was biopsied on 03/11/2013 and was consistent with high-grade carcinoma [phone]). Prognostic panel is pending.   (3) MRI on 03/02/2013 also showed 2 areas of irregularity in the right breast which were worrisome for possible DCIS. Both of these areas were biopsied on 03/04/2013, and both showed fibrocystic changes with no evidence of malignancy ([id number]).   (4) genetics testing is pending  (5)  treated in the neoadjuvant setting with   (a) 4 dose dense cycles of doxorubicin/ cyclophosphamide, with the first dose given on 03/15/2013 and the final one 04/26/2013, with a  complete radiologic response  (b) followed by 12 weekly doses of carboplatin/paclitaxel started 05/17/2013, given in standard dose, with all neoadjuvant chemotherapy to be completed prior to definitive surgery. (6) leukopenia with neutropenia secondary to chemotherapy induced   PLAN:  #1 Case discussed with Dr.Magrinot and decided to stop last 2 weekly doses of chemotherapy in view of persistent neutropenia #2 we will arrange for Neupogen 480 mcg subcutaneously for 2 days to begin today and tomorrow. Patient will be coming on Friday for CBC and differential. Will reassess need of more Neupogen  after review of labs on Friday.  #3 continue fluconazole, 100 mg daily for the nail dyscrasia and likely  mild onychomycosis as scheduled #5 She is scheduled for her repeat Breast MRI on January 29.  #6 She'll see Dr. Jana Hakim on February 3 to review those results,  #7 scheduled for her lumpectomy under the care of Dr. Barry Dienes on February 10.   We have reviewed this plan in detail today, and Shahd voices her understanding.  She knows to call with any additional changes or problems prior to her appointment here next week.    Marland Kitchen Wilmon Arms, MD   Medical oncology 08/02/2013

## 2013-08-03 ENCOUNTER — Ambulatory Visit (HOSPITAL_BASED_OUTPATIENT_CLINIC_OR_DEPARTMENT_OTHER): Payer: BC Managed Care – PPO

## 2013-08-03 VITALS — BP 114/69 | HR 83 | Temp 98.1°F

## 2013-08-03 DIAGNOSIS — C50419 Malignant neoplasm of upper-outer quadrant of unspecified female breast: Secondary | ICD-10-CM

## 2013-08-03 DIAGNOSIS — D709 Neutropenia, unspecified: Secondary | ICD-10-CM

## 2013-08-03 DIAGNOSIS — D702 Other drug-induced agranulocytosis: Secondary | ICD-10-CM

## 2013-08-03 MED ORDER — FILGRASTIM 480 MCG/0.8ML IJ SOLN
480.0000 ug | Freq: Once | INTRAMUSCULAR | Status: AC
  Administered 2013-08-03: 480 ug via SUBCUTANEOUS
  Filled 2013-08-03: qty 0.8

## 2013-08-04 ENCOUNTER — Telehealth: Payer: Self-pay | Admitting: *Deleted

## 2013-08-04 ENCOUNTER — Ambulatory Visit (HOSPITAL_COMMUNITY)
Admission: RE | Admit: 2013-08-04 | Discharge: 2013-08-04 | Disposition: A | Discharge status: Home / Self Care | Payer: BC Managed Care – PPO | Source: Ambulatory Visit | Attending: Physician Assistant | Admitting: Physician Assistant

## 2013-08-04 DIAGNOSIS — Z79899 Other long term (current) drug therapy: Secondary | ICD-10-CM | POA: Insufficient documentation

## 2013-08-04 DIAGNOSIS — C50919 Malignant neoplasm of unspecified site of unspecified female breast: Secondary | ICD-10-CM | POA: Insufficient documentation

## 2013-08-04 DIAGNOSIS — Z171 Estrogen receptor negative status [ER-]: Secondary | ICD-10-CM | POA: Insufficient documentation

## 2013-08-04 DIAGNOSIS — C50412 Malignant neoplasm of upper-outer quadrant of left female breast: Secondary | ICD-10-CM

## 2013-08-04 MED ORDER — GADOBENATE DIMEGLUMINE 529 MG/ML IV SOLN
12.0000 mL | Freq: Once | INTRAVENOUS | Status: AC | PRN
  Administered 2013-08-04: 12 mL via INTRAVENOUS

## 2013-08-04 NOTE — Telephone Encounter (Signed)
Per PA-C request. Called pt to inform her that Dr.Magrinat is pleased with how she has responded to treatment. No evidence of any mass to left breast. Pt was very pleased as well. Pt will see Dr. Jana Hakim on Feb.2nd and will stay on schedule for surgery on Feb.10th. MRI today shows no evidence of mass.Pt verbalized understanding. No further concerns. Message to be forwarded to [employer], PA-C.

## 2013-08-09 ENCOUNTER — Telehealth: Payer: Self-pay | Admitting: *Deleted

## 2013-08-09 ENCOUNTER — Other Ambulatory Visit (HOSPITAL_BASED_OUTPATIENT_CLINIC_OR_DEPARTMENT_OTHER): Payer: BC Managed Care – PPO

## 2013-08-09 ENCOUNTER — Encounter (HOSPITAL_BASED_OUTPATIENT_CLINIC_OR_DEPARTMENT_OTHER): Payer: Self-pay | Admitting: *Deleted

## 2013-08-09 ENCOUNTER — Ambulatory Visit: Payer: BC Managed Care – PPO

## 2013-08-09 ENCOUNTER — Ambulatory Visit (HOSPITAL_BASED_OUTPATIENT_CLINIC_OR_DEPARTMENT_OTHER): Payer: BC Managed Care – PPO | Admitting: Oncology

## 2013-08-09 VITALS — BP 115/81 | HR 93 | Temp 97.9°F | Resp 18 | Ht 67.0 in | Wt 135.9 lb

## 2013-08-09 DIAGNOSIS — Z171 Estrogen receptor negative status [ER-]: Secondary | ICD-10-CM

## 2013-08-09 DIAGNOSIS — C50419 Malignant neoplasm of upper-outer quadrant of unspecified female breast: Secondary | ICD-10-CM

## 2013-08-09 DIAGNOSIS — F329 Major depressive disorder, single episode, unspecified: Secondary | ICD-10-CM

## 2013-08-09 DIAGNOSIS — C50412 Malignant neoplasm of upper-outer quadrant of left female breast: Secondary | ICD-10-CM

## 2013-08-09 DIAGNOSIS — F3289 Other specified depressive episodes: Secondary | ICD-10-CM

## 2013-08-09 DIAGNOSIS — D649 Anemia, unspecified: Secondary | ICD-10-CM

## 2013-08-09 DIAGNOSIS — F411 Generalized anxiety disorder: Secondary | ICD-10-CM

## 2013-08-09 DIAGNOSIS — D702 Other drug-induced agranulocytosis: Secondary | ICD-10-CM

## 2013-08-09 LAB — CBC WITH DIFFERENTIAL/PLATELET
BASO%: 0.3 % (ref 0.0–2.0)
Basophils Absolute: 0 10*3/uL (ref 0.0–0.1)
EOS%: 0.6 % (ref 0.0–7.0)
Eosinophils Absolute: 0 10*3/uL (ref 0.0–0.5)
HCT: 36.3 % (ref 34.8–46.6)
HGB: 12 g/dL (ref 11.6–15.9)
LYMPH%: 30.1 % (ref 14.0–49.7)
MCH: 30.3 pg (ref 25.1–34.0)
MCHC: 33.1 g/dL (ref 31.5–36.0)
MCV: 91.7 fL (ref 79.5–101.0)
MONO#: 0.6 10*3/uL (ref 0.1–0.9)
MONO%: 17.2 % — AB (ref 0.0–14.0)
NEUT#: 1.7 10*3/uL (ref 1.5–6.5)
NEUT%: 51.8 % (ref 38.4–76.8)
PLATELETS: 259 10*3/uL (ref 145–400)
RBC: 3.96 10*6/uL (ref 3.70–5.45)
RDW: 16.6 % — ABNORMAL HIGH (ref 11.2–14.5)
WBC: 3.3 10*3/uL — ABNORMAL LOW (ref 3.9–10.3)
lymph#: 1 10*3/uL (ref 0.9–3.3)
nRBC: 0 % (ref 0–0)

## 2013-08-09 NOTE — Progress Notes (Signed)
ID: Ana Young OB: 05-09-1961  MR#: [id number]  CSN#:[id number]  PCP: Christa See, MD GYN:   SU: Stark Klein OTHER MD: Thea Silversmith, Dalbert Mayotte  CHIEF COMPLAINT:  Left  Breast Cancer/Neoadjuvant Chemotherapy  HISTORY OF PRESENT ILLNESS: Ana Young herself palpated a mass in her left breast 01/15/2013 and brought it to the attention of Dr. Lindell Noe, who arranged for her to have bilateral digital mammography at the [facility] Center 01/27/2013. The patient does have heterogeneously dense breasts. In the outer upper left breast there was an oval mass by mammography measuring approximately 1.5 cm, which by palpation was firm and measured approximately 1 cm. Ultrasound showed this to be hypoechoic and to measure 1.4 cm.  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  The patient then underwent bilateral breast MRI 02/28/2013. In the left breast upper-outer quadrant the mass in question measured 2.9 cm. There was a second mass lateral to it measuring 1.0 cm. There were no additional abnormal appearing lymph nodes and no evidence of internal mammary adenopathy.  In the right breast there were 2 areas of irregular enhancement in the superior central third of the breast, both measuring 9 mm. These were felt to be worrisome for a ductal carcinoma in situ.  The patient's case was discussed at the multidisciplinary breast cancer conference 03/02/2013. Her subsequent history is as detailed below.  INTERVAL HISTORY: Ana Young returns today accompanied by her husband Ana Young for followup of her locally advanced left breast carcinoma. She is due for her 11th of 12 planned weekly doses of neoadjuvant carboplatin/paclitaxel today.    Last week chemotherapy was held in view of  neutropenia. She does not complain of any fever, shortness of breath ,chills, or sore throat.  She does complain of altered taste.  Her [place] is persistently low  with ANC of 600 and total WBC of 1800    Review of systems:   She denies any mouth ulcers, oral sensitivity, or problems swallowing. She is eating well, and keeping herself well hydrated. She denies any nausea or emesis and has had no change in bowel or bladder habits. She's had no cough, phlegm production,or chest pain.  She's had no abnormal headaches or dizziness. She denies any current myalgias, arthralgias, or bony pain, and has had no peripheral swelling.  Drainage from the underneath the nails getting better   A detailed review of systems is otherwise stable and noncontributory.    PAST MEDICAL HISTORY: Past Medical History  Diagnosis Date  . Depression   . Vitamin D deficiency   . Anxiety   . Seasonal allergies   . HTN (hypertension)   . Insomnia   . History of migraines   . Cancer     Left breast cancer  . Breast cancer   . Anemia 06/07/2013  . Drug induced neutropenia(288.03) 08/02/2013    PAST SURGICAL HISTORY: Past Surgical History  Procedure Laterality Date  . Bunionectomy Left 2009  . Portacath placement Left 03/09/2013    Procedure: INSERTION PORT-A-CATH;  Surgeon: Stark Klein, MD;  Location: [facility];  Service: General;  Laterality: Left;    FAMILY HISTORY Family History  Problem Relation Age of Onset  . Heart attack Brother   . Congestive Heart Failure Mother   . Stroke Father   . Diabetes Father   . Breast cancer Maternal Grandmother 97   the patient's parents are in  their 3s. The patient has one brother, no sisters. The patient's maternal grandmother was diagnosed with breast cancer the age of 22. There is no other history of breast cancer in the family to the patient's knowledge, and no history of ovarian cancer  GYNECOLOGIC HISTORY:  Menarche age 49, first live birth age 65, the patient is GX P2. Her periods are irregular, but still every 2-3 months, and otherwise  unremarkable.  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  She is currently not working. Her husband Ana Young works as a Nurse, learning disability. Son Ana Young lives in [place] and works in Scientist, research (medical). Daughter Ana Young is 6 year old and at home. The patient has no grandchildren. She attends the [employer]    ADVANCED DIRECTIVES: Not in place  HEALTH MAINTENANCE:  (Updated January 2015) History  Substance Use Topics  . Smoking status: Never Smoker   . Smokeless tobacco: Never Used  . Alcohol Use: No     Colonoscopy: - Never  PAP: Feb 2012, Dr. Buelah Manis  Bone density: - Never  Lipid panel:  Dec 2012, Dr. Zigmund Daniel   No Known Allergies  Current Outpatient Prescriptions  Medication Sig Dispense Refill  . ALPRAZolam (XANAX) 1 MG tablet Take 1 mg by mouth 2 (two) times daily as needed for sleep (1/2 tablet bid as needed).      Marland Kitchen buPROPion (WELLBUTRIN XL) 300 MG 24 hr tablet Take 1 tablet (300 mg total) by mouth daily.  30 tablet  3  . docusate sodium (COLACE) 100 MG capsule Take 1-2 capsules (100-200 mg total) by mouth every morning. As directed for constipation  30 capsule  1  . ibuprofen (ADVIL,MOTRIN) 800 MG tablet TAKE 1 TABLET (800 MG TOTAL) BY MOUTH EVERY 8 (EIGHT) HOURS AS NEEDED FOR PAIN.  30 tablet  2  . lidocaine-prilocaine (EMLA) cream Apply topically as needed. For chemotherapy  30 g  0  . LORazepam (ATIVAN) 0.5 MG tablet Take 1 tablet (0.5 mg total) by mouth at bedtime as needed for anxiety (Nausea or vomiting).  30 tablet  0  . ondansetron (ZOFRAN) 8 MG tablet Take 1 tablet (8 mg total) by mouth 2 (two) times daily. 1 tab by mouth twice daily x 2 days after chemo, then 1 tab every 12 hrs PRN nausea  30 tablet  1  . polyethylene glycol powder (MIRALAX) powder Take 17 g by mouth daily as needed (constipation).  255 g  1  . prochlorperazine (COMPAZINE) 10 MG  tablet Take 1 tablet (10 mg total) by mouth every 6 (six) hours as needed for nausea or vomiting.  30 tablet  1  . propranolol ER (INDERAL LA) 120 MG 24 hr capsule Take 1 capsule (120 mg total) by mouth daily.  93 capsule  1  . zolpidem (AMBIEN) 5 MG tablet Take 1 tablet (5 mg total) by mouth at bedtime as needed for sleep.  30 tablet  3   No current facility-administered medications for this visit.   Facility-Administered Medications Ordered in Other Visits  Medication Dose Route Frequency Provider Last Rate Last Dose  . sodium chloride 0.9 % injection 10 mL  10 mL Intracatheter PRN Chauncey Cruel, MD   10 mL at 06/21/13 1322     Middle-aged African American woman who appears stated age 53 Vitals:   08/09/13 0914  BP: 115/81  Pulse: 93  Temp: 97.9 F (  36.6 C)  Resp: 18       Body mass index is 21.28 kg/(m^2).    ECOG FS: 1 Filed Weights   08/09/13 0914  Weight: 135 lb 14.4 oz (61.644 kg)   Sclerae unicteric, pupils equal and reactive Oropharynx clear and moist-- no thrush No cervical or supraclavicular adenopathy Lungs no rales or rhonchi Heart regular rate and rhythm Abd soft, nontender, positive bowel sounds MSK no focal spinal tenderness, no upper extremity lymphedema Neuro: nonfocal, well oriented, appropriate affect Breasts:  I do not palpate a mass in either breast. Both axillae are benign. Skin: In addition to hyperpigmentation over the fingers, she has some nail dyscrasia and it looks like at least 2 of the fingernails may come off at some point in the near future   LAB RESULTS:  Lab Results  Component Value Date   WBC 3.3* 08/09/2013   NEUTROABS 1.7 08/09/2013   HGB 12.0 08/09/2013   HCT 36.3 08/09/2013   MCV 91.7 08/09/2013   PLT 259 08/09/2013      Chemistry      Component Value Date/Time   NA 139 07/19/2013 1242   NA 138 06/16/2011 0938   K 4.0 07/19/2013 1242   K 4.1 06/16/2011 0938   CL 110 06/16/2011 0938   CO2 27 07/19/2013 1242   CO2 26 06/16/2011  0938   BUN 10.7 07/19/2013 1242   BUN 10 06/16/2011 0938   CREATININE 0.7 07/19/2013 1242   CREATININE 0.81 06/16/2011 0938   CREATININE 0.74 07/26/2010 2332      Component Value Date/Time   CALCIUM 9.7 07/19/2013 1242   CALCIUM 9.5 06/16/2011 0938   ALKPHOS 88 07/19/2013 1242   ALKPHOS 78 06/16/2011 0938   AST 14 07/19/2013 1242   AST 13 06/16/2011 0938   ALT 17 07/19/2013 1242   ALT 10 06/16/2011 0938   BILITOT 0.28 07/19/2013 1242   BILITOT 0.5 06/16/2011 5974      STUDIES: Mr Breast Bilateral W Wo Contrast  08/04/2013   CLINICAL DATA:  Patient with recently diagnosed 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 Patient is undergoing neoadjuvant chemotherapy. Assess response to neoadjuvant chemotherapy.  EXAM: BILATERAL BREAST MRI WITH AND WITHOUT CONTRAST  LABS:  Not applicable  TECHNIQUE: Multiplanar, multisequence MR images of both breasts were obtained prior to and following the intravenous administration of 78m of MultiHance.  THREE-DIMENSIONAL MR IMAGE RENDERING ON INDEPENDENT WORKSTATION:  Three-dimensional MR images were rendered by post-processing of the original MR data on an independent workstation. The three-dimensional MR images were interpreted, and findings are reported in the following complete MRI report for this study. Three dimensional images were evaluated at the independent DynaCad workstation  COMPARISON:  Previous exams  FINDINGS: Breast composition: c:  Heterogeneous fibroglandular tissue  Background parenchymal enhancement: Moderate  Right breast: The previously described area of clumped linear enhancement within the central right breast is without change. The more medial area of previously described enhancement, which was biopsied with benign results, is not well visualized on today's examination. No new abnormal enhancement or mass.  Left breast: The previously seen enhancing masses in the superior left breast are not visible on MRI. No new  abnormal enhancement or mass.  Lymph nodes: No abnormal appearing lymph nodes.  Ancillary findings:  None.  IMPRESSION: Examination is unchanged from 05/02/2013. Left breast masses seen on the MRI at initial diagnosis of carcinoma in August 2014 are no longer apparent.  RECOMMENDATION: Treatment planning.  BI-RADS CATEGORY  6: Known biopsy-proven malignancy - appropriate action should be taken.   Electronically Signed   By: Donavan Burnet M.D.   On: 08/04/2013 10:50     ASSESSMENT: 53 y.o. Ana Young woman   (1)  status post left breast upper outer quadrant biopsy 02/22/2013 for a clinical T2 N0, stage IIA invasive ductal carcinoma, grade 3, triple negative, with an MIB-1 of 100%.    (2) MRI on 03/02/2013 showed an additional nodule at the 2:00 position, 5 mm lateral to the primary in the left breast, measuring 10 x 6 x 9 mm. This was biopsied on 03/11/2013 and was consistent with high-grade carcinoma [phone]). Prognostic panel is pending.   (3) MRI on 03/02/2013 also showed 2 areas of irregularity in the right breast which were worrisome for possible DCIS. Both of these areas were biopsied on 03/04/2013, and both showed fibrocystic changes with no evidence of malignancy ([id number]).   (4) genetics testing is pending  (5)  treated in the neoadjuvant setting with   (a) 4 dose dense cycles of doxorubicin/ cyclophosphamide, with the first dose given on 03/15/2013 and the final one 04/26/2013, with a complete radiologic response  (b) followed by 10 weekly doses of carboplatin/paclitaxel started 05/17/2013, completed 07/19/2013, final two doses omitted because of neuropathy and cytopenias   PLAN:  Isla has completed her neoadjuvant chemotherapy, and is now ready for surgery. We are not planning to give her any postsurgical chemotherapy and therefore the port can be removed at the time of surgery. The MRI is very favorable, but of course we have to wait on pathology before really knowing  what kind of response she got. I have made her return appointment with me in early March to go over just about question.  Today we discussed the genetics. She feels she had not understood what genetics was all about and now does desire genetic testing. We will make an appointment for her with our genetics counselor.  As far as the fingernails are concerned I suggested she soak her nails in warm water with a teaspoon of vinegar. If she actually develops plus she will let us know and we'll start her on an antibiotic.  Abigal has a good understanding of the overall plan, and agrees with it. She knows the intent of treatment in her case is curative. She will call with any problems that may develop before her next visit. Chauncey Cruel, MD   08/09/2013

## 2013-08-09 NOTE — Progress Notes (Signed)
To come in for [facility] u/a

## 2013-08-09 NOTE — Telephone Encounter (Signed)
appts made and printed...td 

## 2013-08-10 ENCOUNTER — Telehealth (INDEPENDENT_AMBULATORY_CARE_PROVIDER_SITE_OTHER): Payer: Self-pay

## 2013-08-10 NOTE — Telephone Encounter (Signed)
Pt's post op appointment has been changed to 09/05/13 at 12 noon per Dr. Barry Dienes.

## 2013-08-10 NOTE — Telephone Encounter (Signed)
Patient called back and was given below message.  Patient states understanding and agreeable at this time.

## 2013-08-11 ENCOUNTER — Telehealth: Payer: Self-pay | Admitting: *Deleted

## 2013-08-11 ENCOUNTER — Encounter (HOSPITAL_BASED_OUTPATIENT_CLINIC_OR_DEPARTMENT_OTHER)
Admission: RE | Admit: 2013-08-11 | Discharge: 2013-08-11 | Disposition: A | Discharge status: Home / Self Care | Payer: BC Managed Care – PPO | Source: Ambulatory Visit | Attending: General Surgery | Admitting: General Surgery

## 2013-08-11 DIAGNOSIS — Z01812 Encounter for preprocedural laboratory examination: Secondary | ICD-10-CM | POA: Insufficient documentation

## 2013-08-11 LAB — URINALYSIS, ROUTINE W REFLEX MICROSCOPIC
Bilirubin Urine: NEGATIVE
Glucose, UA: NEGATIVE mg/dL
KETONES UR: NEGATIVE mg/dL
Leukocytes, UA: NEGATIVE
NITRITE: NEGATIVE
Protein, ur: NEGATIVE mg/dL
SPECIFIC GRAVITY, URINE: 1.008 (ref 1.005–1.030)
UROBILINOGEN UA: 0.2 mg/dL (ref 0.0–1.0)
pH: 6 (ref 5.0–8.0)

## 2013-08-11 LAB — URINE MICROSCOPIC-ADD ON

## 2013-08-11 NOTE — Telephone Encounter (Signed)
Called pt and confirmed 08/22/13 genetic appt.  Mailed pt calendar.

## 2013-08-16 ENCOUNTER — Encounter (HOSPITAL_BASED_OUTPATIENT_CLINIC_OR_DEPARTMENT_OTHER): Payer: Self-pay

## 2013-08-16 ENCOUNTER — Ambulatory Visit (HOSPITAL_BASED_OUTPATIENT_CLINIC_OR_DEPARTMENT_OTHER)
Admission: RE | Admit: 2013-08-16 | Discharge: 2013-08-16 | Disposition: A | Discharge status: Home / Self Care | Payer: BC Managed Care – PPO | Source: Ambulatory Visit | Attending: General Surgery | Admitting: General Surgery

## 2013-08-16 ENCOUNTER — Encounter (HOSPITAL_COMMUNITY)
Admission: RE | Admit: 2013-08-16 | Discharge: 2013-08-16 | Disposition: A | Discharge status: Home / Self Care | Payer: BC Managed Care – PPO | Source: Ambulatory Visit | Attending: General Surgery | Admitting: General Surgery

## 2013-08-16 ENCOUNTER — Ambulatory Visit (HOSPITAL_BASED_OUTPATIENT_CLINIC_OR_DEPARTMENT_OTHER): Payer: BC Managed Care – PPO | Admitting: Anesthesiology

## 2013-08-16 ENCOUNTER — Ambulatory Visit
Admission: RE | Admit: 2013-08-16 | Discharge: 2013-08-16 | Disposition: A | Discharge status: Home / Self Care | Payer: BC Managed Care – PPO | Source: Ambulatory Visit | Attending: General Surgery | Admitting: General Surgery

## 2013-08-16 ENCOUNTER — Encounter (HOSPITAL_BASED_OUTPATIENT_CLINIC_OR_DEPARTMENT_OTHER)
Admission: RE | Disposition: A | Discharge status: Home / Self Care | Payer: Self-pay | Source: Ambulatory Visit | Attending: General Surgery

## 2013-08-16 ENCOUNTER — Encounter (HOSPITAL_BASED_OUTPATIENT_CLINIC_OR_DEPARTMENT_OTHER): Payer: BC Managed Care – PPO | Admitting: Anesthesiology

## 2013-08-16 DIAGNOSIS — C50912 Malignant neoplasm of unspecified site of left female breast: Secondary | ICD-10-CM

## 2013-08-16 DIAGNOSIS — I10 Essential (primary) hypertension: Secondary | ICD-10-CM | POA: Insufficient documentation

## 2013-08-16 DIAGNOSIS — C50919 Malignant neoplasm of unspecified site of unspecified female breast: Secondary | ICD-10-CM

## 2013-08-16 HISTORY — PX: PARTIAL MASTECTOMY WITH NEEDLE LOCALIZATION AND AXILLARY SENTINEL LYMPH NODE BX: SHX6009

## 2013-08-16 LAB — POCT HEMOGLOBIN-HEMACUE: Hemoglobin: 12.3 g/dL (ref 12.0–15.0)

## 2013-08-16 SURGERY — PARTIAL MASTECTOMY WITH NEEDLE LOCALIZATION AND AXILLARY SENTINEL LYMPH NODE BX
Anesthesia: General | Site: Breast | Laterality: Left

## 2013-08-16 MED ORDER — ACETAMINOPHEN 650 MG RE SUPP: 650.0000 mg | RECTAL | Status: DC | PRN

## 2013-08-16 MED ORDER — OXYCODONE HCL 5 MG PO TABS: 5.0000 mg | ORAL_TABLET | Freq: Once | ORAL | Status: DC | PRN

## 2013-08-16 MED ORDER — ONDANSETRON HCL 4 MG/2ML IJ SOLN: 4.0000 mg | Freq: Once | INTRAMUSCULAR | Status: DC | PRN

## 2013-08-16 MED ORDER — SODIUM CHLORIDE 0.9 % IJ SOLN: 3.0000 mL | Freq: Two times a day (BID) | INTRAMUSCULAR | Status: DC

## 2013-08-16 MED ORDER — BUPIVACAINE HCL (PF) 0.25 % IJ SOLN
INTRAMUSCULAR | Status: DC | PRN
  Administered 2013-08-16: 17 mL

## 2013-08-16 MED ORDER — HYDROMORPHONE HCL PF 1 MG/ML IJ SOLN
0.2500 mg | INTRAMUSCULAR | Status: DC | PRN
  Administered 2013-08-16 (×4): 0.5 mg via INTRAVENOUS

## 2013-08-16 MED ORDER — MIDAZOLAM HCL 5 MG/5ML IJ SOLN
INTRAMUSCULAR | Status: DC | PRN
  Administered 2013-08-16: 2 mg via INTRAVENOUS

## 2013-08-16 MED ORDER — MIDAZOLAM HCL 2 MG/2ML IJ SOLN
INTRAMUSCULAR | Status: AC
  Filled 2013-08-16: qty 2

## 2013-08-16 MED ORDER — SODIUM CHLORIDE 0.9 % IJ SOLN: 3.0000 mL | INTRAMUSCULAR | Status: DC | PRN

## 2013-08-16 MED ORDER — DEXAMETHASONE SODIUM PHOSPHATE 4 MG/ML IJ SOLN
INTRAMUSCULAR | Status: DC | PRN
  Administered 2013-08-16: 10 mg via INTRAVENOUS

## 2013-08-16 MED ORDER — ACETAMINOPHEN 325 MG PO TABS: 650.0000 mg | ORAL_TABLET | ORAL | Status: DC | PRN

## 2013-08-16 MED ORDER — FENTANYL CITRATE 0.05 MG/ML IJ SOLN
INTRAMUSCULAR | Status: AC
  Filled 2013-08-16: qty 2

## 2013-08-16 MED ORDER — FENTANYL CITRATE 0.05 MG/ML IJ SOLN
INTRAMUSCULAR | Status: DC | PRN
  Administered 2013-08-16: 50 ug via INTRAVENOUS

## 2013-08-16 MED ORDER — CHLORHEXIDINE GLUCONATE 4 % EX LIQD: 1.0000 "application " | Freq: Once | CUTANEOUS | Status: DC

## 2013-08-16 MED ORDER — DEXTROSE 5 % IV SOLN
10.0000 mg | INTRAVENOUS | Status: DC | PRN
  Administered 2013-08-16: 50 ug/min via INTRAVENOUS

## 2013-08-16 MED ORDER — ONDANSETRON HCL 4 MG/2ML IJ SOLN
INTRAMUSCULAR | Status: DC | PRN
  Administered 2013-08-16: 4 mg via INTRAVENOUS

## 2013-08-16 MED ORDER — SODIUM CHLORIDE 0.9 % IJ SOLN
INTRAMUSCULAR | Status: DC | PRN
  Administered 2013-08-16: 11:00:00

## 2013-08-16 MED ORDER — FENTANYL CITRATE 0.05 MG/ML IJ SOLN
50.0000 ug | INTRAMUSCULAR | Status: DC | PRN
  Administered 2013-08-16 (×2): 50 ug via INTRAVENOUS

## 2013-08-16 MED ORDER — SODIUM CHLORIDE 0.9 % IJ SOLN
INTRAMUSCULAR | Status: AC
  Filled 2013-08-16: qty 10

## 2013-08-16 MED ORDER — EPHEDRINE SULFATE 50 MG/ML IJ SOLN
INTRAMUSCULAR | Status: DC | PRN
  Administered 2013-08-16 (×2): 10 mg via INTRAVENOUS

## 2013-08-16 MED ORDER — HYDROMORPHONE HCL PF 1 MG/ML IJ SOLN
INTRAMUSCULAR | Status: AC
  Filled 2013-08-16: qty 1

## 2013-08-16 MED ORDER — OXYCODONE HCL 5 MG/5ML PO SOLN: 5.0000 mg | Freq: Once | ORAL | Status: DC | PRN

## 2013-08-16 MED ORDER — FENTANYL CITRATE 0.05 MG/ML IJ SOLN
INTRAMUSCULAR | Status: AC
  Filled 2013-08-16: qty 8

## 2013-08-16 MED ORDER — LACTATED RINGERS IV SOLN
INTRAVENOUS | Status: DC
  Administered 2013-08-16: 20 mL/h via INTRAVENOUS
  Administered 2013-08-16 (×2): via INTRAVENOUS

## 2013-08-16 MED ORDER — METHYLENE BLUE 1 % INJ SOLN
INTRAMUSCULAR | Status: AC
  Filled 2013-08-16: qty 10

## 2013-08-16 MED ORDER — BUPIVACAINE HCL (PF) 0.25 % IJ SOLN
INTRAMUSCULAR | Status: AC
  Filled 2013-08-16: qty 30

## 2013-08-16 MED ORDER — OXYCODONE HCL 5 MG PO TABS
ORAL_TABLET | ORAL | Status: AC
  Filled 2013-08-16: qty 1

## 2013-08-16 MED ORDER — OXYCODONE HCL 5 MG PO TABS
5.0000 mg | ORAL_TABLET | ORAL | Status: DC | PRN
  Administered 2013-08-16: 5 mg via ORAL

## 2013-08-16 MED ORDER — PROPOFOL 10 MG/ML IV BOLUS
INTRAVENOUS | Status: DC | PRN
  Administered 2013-08-16: 250 mg via INTRAVENOUS

## 2013-08-16 MED ORDER — LIDOCAINE HCL (CARDIAC) 20 MG/ML IV SOLN
INTRAVENOUS | Status: DC | PRN
  Administered 2013-08-16: 80 mg via INTRAVENOUS

## 2013-08-16 MED ORDER — ONDANSETRON HCL 4 MG/2ML IJ SOLN: 4.0000 mg | Freq: Four times a day (QID) | INTRAMUSCULAR | Status: DC | PRN

## 2013-08-16 MED ORDER — SODIUM CHLORIDE 0.9 % IV SOLN
250.0000 mL | INTRAVENOUS | Status: DC | PRN
Start: 2013-08-16 — End: 2013-08-16

## 2013-08-16 MED ORDER — CEFAZOLIN SODIUM-DEXTROSE 2-3 GM-% IV SOLR
2.0000 g | INTRAVENOUS | Status: AC
  Administered 2013-08-16: 2 g via INTRAVENOUS

## 2013-08-16 MED ORDER — TECHNETIUM TC 99M SULFUR COLLOID FILTERED
1.0000 | Freq: Once | INTRAVENOUS | Status: AC | PRN
  Administered 2013-08-16: 1 via INTRADERMAL

## 2013-08-16 MED ORDER — MIDAZOLAM HCL 2 MG/2ML IJ SOLN
1.0000 mg | INTRAMUSCULAR | Status: DC | PRN
  Administered 2013-08-16: 2 mg via INTRAVENOUS

## 2013-08-16 MED ORDER — OXYCODONE-ACETAMINOPHEN 5-325 MG PO TABS: 1.0000 | ORAL_TABLET | ORAL | Status: DC | PRN

## 2013-08-16 SURGICAL SUPPLY — 67 items
ADH SKN CLS APL DERMABOND .7 (GAUZE/BANDAGES/DRESSINGS) ×1
BINDER BREAST LRG (GAUZE/BANDAGES/DRESSINGS) IMPLANT
BINDER BREAST MEDIUM (GAUZE/BANDAGES/DRESSINGS) ×1 IMPLANT
BINDER BREAST XLRG (GAUZE/BANDAGES/DRESSINGS) IMPLANT
BINDER BREAST XXLRG (GAUZE/BANDAGES/DRESSINGS) IMPLANT
BLADE HEX COATED 2.75 (ELECTRODE) ×2 IMPLANT
BLADE SURG 10 STRL SS (BLADE) ×2 IMPLANT
BLADE SURG 15 STRL LF DISP TIS (BLADE) ×1 IMPLANT
BLADE SURG 15 STRL SS (BLADE) ×2
BNDG COHESIVE 4X5 TAN STRL (GAUZE/BANDAGES/DRESSINGS) ×1 IMPLANT
BNDG GAUZE ELAST 4 BULKY (GAUZE/BANDAGES/DRESSINGS) ×1 IMPLANT
CANISTER SUCT 1200ML W/VALVE (MISCELLANEOUS) ×2 IMPLANT
CHLORAPREP W/TINT 26ML (MISCELLANEOUS) ×2 IMPLANT
CLIP TI LARGE 6 (CLIP) ×2 IMPLANT
CLIP TI MEDIUM 6 (CLIP) ×4 IMPLANT
CLIP TI WIDE RED SMALL 6 (CLIP) IMPLANT
COVER MAYO STAND STRL (DRAPES) ×2 IMPLANT
COVER PROBE W GEL 5X96 (DRAPES) ×2 IMPLANT
DECANTER SPIKE VIAL GLASS SM (MISCELLANEOUS) IMPLANT
DERMABOND ADVANCED (GAUZE/BANDAGES/DRESSINGS) ×1
DERMABOND ADVANCED .7 DNX12 (GAUZE/BANDAGES/DRESSINGS) IMPLANT
DEVICE DUBIN W/COMP PLATE 8390 (MISCELLANEOUS) ×1 IMPLANT
DRAIN CHANNEL 19F RND (DRAIN) IMPLANT
DRAPE UTILITY XL STRL (DRAPES) ×4 IMPLANT
DRSG TEGADERM 4X4.75 (GAUZE/BANDAGES/DRESSINGS) IMPLANT
ELECT REM PT RETURN 9FT ADLT (ELECTROSURGICAL) ×2
ELECTRODE REM PT RTRN 9FT ADLT (ELECTROSURGICAL) ×1 IMPLANT
EVACUATOR SILICONE 100CC (DRAIN) IMPLANT
GLOVE BIO SURGEON STRL SZ 6 (GLOVE) ×2 IMPLANT
GLOVE BIOGEL PI IND STRL 6.5 (GLOVE) ×1 IMPLANT
GLOVE BIOGEL PI IND STRL 7.5 (GLOVE) IMPLANT
GLOVE BIOGEL PI INDICATOR 6.5 (GLOVE) ×1
GLOVE BIOGEL PI INDICATOR 7.5 (GLOVE) ×1
GLOVE SURG SS PI 8.0 STRL IVOR (GLOVE) ×1 IMPLANT
GOWN STRL REUS W/ TWL LRG LVL3 (GOWN DISPOSABLE) ×1 IMPLANT
GOWN STRL REUS W/TWL 2XL LVL3 (GOWN DISPOSABLE) ×2 IMPLANT
GOWN STRL REUS W/TWL LRG LVL3 (GOWN DISPOSABLE) ×2
KIT MARKER MARGIN INK (KITS) ×2 IMPLANT
NDL HYPO 25X1 1.5 SAFETY (NEEDLE) ×2 IMPLANT
NDL SAFETY ECLIPSE 18X1.5 (NEEDLE) ×1 IMPLANT
NEEDLE HYPO 18GX1.5 SHARP (NEEDLE) ×2
NEEDLE HYPO 25X1 1.5 SAFETY (NEEDLE) ×4 IMPLANT
NS IRRIG 1000ML POUR BTL (IV SOLUTION) ×1 IMPLANT
PACK BASIN DAY SURGERY FS (CUSTOM PROCEDURE TRAY) ×2 IMPLANT
PACK UNIVERSAL I (CUSTOM PROCEDURE TRAY) ×2 IMPLANT
PAD ABD 8X10 STRL (GAUZE/BANDAGES/DRESSINGS) IMPLANT
PENCIL BUTTON HOLSTER BLD 10FT (ELECTRODE) ×2 IMPLANT
PIN SAFETY STERILE (MISCELLANEOUS) IMPLANT
SLEEVE SCD COMPRESS KNEE MED (MISCELLANEOUS) ×1 IMPLANT
SPONGE GAUZE 4X4 12PLY (GAUZE/BANDAGES/DRESSINGS) IMPLANT
SPONGE LAP 18X18 X RAY DECT (DISPOSABLE) ×2 IMPLANT
SPONGE LAP 4X18 X RAY DECT (DISPOSABLE) IMPLANT
STAPLER VISISTAT 35W (STAPLE) ×2 IMPLANT
STOCKINETTE IMPERVIOUS LG (DRAPES) ×2 IMPLANT
STRIP CLOSURE SKIN 1/2X4 (GAUZE/BANDAGES/DRESSINGS) ×2 IMPLANT
SUT MON AB 4-0 PC3 18 (SUTURE) ×3 IMPLANT
SUT SILK 2 0 SH (SUTURE) ×1 IMPLANT
SUT VIC AB 2-0 SH 27 (SUTURE)
SUT VIC AB 2-0 SH 27XBRD (SUTURE) IMPLANT
SUT VIC AB 3-0 SH 27 (SUTURE) ×4
SUT VIC AB 3-0 SH 27X BRD (SUTURE) ×2 IMPLANT
SYR BULB 3OZ (MISCELLANEOUS) ×1 IMPLANT
SYR CONTROL 10ML LL (SYRINGE) ×4 IMPLANT
TOWEL OR 17X24 6PK STRL BLUE (TOWEL DISPOSABLE) ×2 IMPLANT
TOWEL OR NON WOVEN STRL DISP B (DISPOSABLE) ×2 IMPLANT
TUBE CONNECTING 20X1/4 (TUBING) ×2 IMPLANT
YANKAUER SUCT BULB TIP NO VENT (SUCTIONS) ×2 IMPLANT

## 2013-08-16 NOTE — Anesthesia Preprocedure Evaluation (Signed)
Anesthesia Evaluation  Patient identified by MRN, date of birth, ID band Patient awake    Reviewed: Allergy & Precautions, H&P , NPO status , Patient's Chart, lab work & pertinent test results  Airway Mallampati: I TM Distance: >3 FB Neck ROM: Full    Dental  (+) Teeth Intact and Dental Advisory Given   Pulmonary  breath sounds clear to auscultation        Cardiovascular hypertension, Pt. on medications Rhythm:Regular Rate:Normal     Neuro/Psych    GI/Hepatic   Endo/Other    Renal/GU      Musculoskeletal   Abdominal   Peds  Hematology   Anesthesia Other Findings   Reproductive/Obstetrics                           Anesthesia Physical Anesthesia Plan  ASA: II  Anesthesia Plan: General   Post-op Pain Management:    Induction: Intravenous  Airway Management Planned: LMA  Additional Equipment:   Intra-op Plan:   Post-operative Plan: Extubation in OR  Informed Consent: I have reviewed the patients History and Physical, chart, labs and discussed the procedure including the risks, benefits and alternatives for the proposed anesthesia with the patient or authorized representative who has indicated his/her understanding and acceptance.   Dental advisory given  Plan Discussed with: CRNA, Anesthesiologist and Surgeon  Anesthesia Plan Comments:         Anesthesia Quick Evaluation

## 2013-08-16 NOTE — Transfer of Care (Signed)
Immediate Anesthesia Transfer of Care Note  Patient: Ana Young  Procedure(s) Performed: Procedure(s): PARTIAL MASTECTOMY WITH NEEDLE LOCALIZATION AND AXILLARY SENTINEL LYMPH NODE BIOPSY AND PORT A CATH REMOVAL (Left)  Patient Location: PACU  Anesthesia Type:General  Level of Consciousness: sedated  Airway & Oxygen Therapy: Patient Spontanous Breathing and Patient connected to face mask oxygen  Post-op Assessment: Report given to PACU RN and Post -op Vital signs reviewed and stable  Post vital signs: stable  Complications: No apparent anesthesia complications

## 2013-08-16 NOTE — Anesthesia Postprocedure Evaluation (Signed)
  Anesthesia Post-op Note  Patient: Ana Young  Procedure(s) Performed: Procedure(s): PARTIAL MASTECTOMY WITH NEEDLE LOCALIZATION AND AXILLARY SENTINEL LYMPH NODE BIOPSY AND PORT A CATH REMOVAL (Left)  Patient Location: PACU  Anesthesia Type:General  Level of Consciousness: awake, alert  and oriented  Airway and Oxygen Therapy: Patient Spontanous Breathing  Post-op Pain: mild  Post-op Assessment: Post-op Vital signs reviewed  Post-op Vital Signs: Reviewed  Complications: No apparent anesthesia complications

## 2013-08-16 NOTE — Discharge Instructions (Addendum)
[facility] °Office Phone Number [phone] ° °BREAST BIOPSY/ PARTIAL MASTECTOMY: POST OP INSTRUCTIONS ° °Always review your discharge instruction sheet given to you by the facility where your surgery was performed. ° °IF YOU HAVE DISABILITY OR FAMILY LEAVE FORMS, YOU MUST BRING THEM TO THE OFFICE FOR PROCESSING.  DO NOT GIVE THEM TO YOUR DOCTOR. ° °1. A prescription for pain medication may be given to you upon discharge.  Take your pain medication as prescribed, if needed.  If narcotic pain medicine is not needed, then you may take acetaminophen (Tylenol) or ibuprofen (Advil) as needed. °2. Take your usually prescribed medications unless otherwise directed °3. If you need a refill on your pain medication, please contact your pharmacy.  They will contact our office to request authorization.  Prescriptions will not be filled after 5pm or on week-ends. °4. You should eat very light the first 24 hours after surgery, such as soup, crackers, pudding, etc.  Resume your normal diet the day after surgery. °5. Most patients will experience some swelling and bruising in the breast.  Ice packs and a good support bra will help.  Swelling and bruising can take several days to resolve.  °6. It is common to experience some constipation if taking pain medication after surgery.  Increasing fluid intake and taking a stool softener will usually help or prevent this problem from occurring.  A mild laxative (Milk of Magnesia or Miralax) should be taken according to package directions if there are no bowel movements after 48 hours. °7. Unless discharge instructions indicate otherwise, you may remove your bandages 48 hours after surgery, and you may shower at that time.  You may have steri-strips (small skin tapes) in place directly over the incision.  These strips should be left on the skin for 7-10 days.   Any sutures or staples will be removed at the office during your follow-up visit. °8. ACTIVITIES:  You may resume  regular daily activities (gradually increasing) beginning the next day.  Wearing a good support bra or sports bra (or the breast binder) minimizes pain and swelling.  You may have sexual intercourse when it is comfortable. °a. You may drive when you no longer are taking prescription pain medication, you can comfortably wear a seatbelt, and you can safely maneuver your car and apply brakes. °b. RETURN TO WORK:  __________1 week_______________ °9. You should see your doctor in the office for a follow-up appointment approximately two weeks after your surgery.  Your doctor’s nurse will typically make your follow-up appointment when she calls you with your pathology report.  Expect your pathology report 2-3 business days after your surgery.  You may call to check if you do not hear from us after three days. ° ° °WHEN TO CALL YOUR DOCTOR: °1. Fever over 101.0 °2. Nausea and/or vomiting. °3. Extreme swelling or bruising. °4. Continued bleeding from incision. °5. Increased pain, redness, or drainage from the incision. ° °The clinic staff is available to answer your questions during regular business hours.  Please don’t hesitate to call and ask to speak to one of the nurses for clinical concerns.  If you have a medical emergency, go to the nearest emergency room or call 911.  A surgeon from [facility] is always on call at the hospital. ° °For further questions, please visit [email or web address]  ° ° °Post Anesthesia Home Care Instructions ° °Activity: °Get plenty of rest for the remainder of the day. A responsible adult should stay with you for 24   hours following the procedure.  °For the next 24 hours, DO NOT: °-Drive a car °-Operate machinery °-Drink alcoholic beverages °-Take any medication unless instructed by your physician °-Make any legal decisions or sign important papers. ° °Meals: °Start with liquid foods such as gelatin or soup. Progress to regular foods as tolerated. Avoid greasy, spicy, heavy  foods. If nausea and/or vomiting occur, drink only clear liquids until the nausea and/or vomiting subsides. Call your physician if vomiting continues. ° °Special Instructions/Symptoms: °Your throat may feel dry or sore from the anesthesia or the breathing tube placed in your throat during surgery. If this causes discomfort, gargle with warm salt water. The discomfort should disappear within 24 hours. ° ° °

## 2013-08-16 NOTE — Anesthesia Procedure Notes (Signed)
Procedure Name: LMA Insertion Date/Time: 08/16/2013 10:23 AM Performed by: Toula Moos Pre-anesthesia Checklist: Patient identified, Emergency Drugs available, Suction available, Patient being monitored and Timeout performed Patient Re-evaluated:Patient Re-evaluated prior to inductionOxygen Delivery Method: Circle system utilized Preoxygenation: Pre-oxygenation with 100% oxygen Intubation Type: IV induction Ventilation: Mask ventilation without difficulty LMA: LMA inserted LMA Size: 4.0 Number of attempts: 1 Placement Confirmation: breath sounds checked- equal and bilateral and positive ETCO2 Tube secured with: Tape Dental Injury: Teeth and Oropharynx as per pre-operative assessment

## 2013-08-16 NOTE — Op Note (Signed)
Left Breast Needle localized bracketed Partial mastectomy with Sentinel Node Mapping and Biopsy, left subclavian port a cath removal  Indications: This patient presents with history of left breast cancer with clinically negative axillary lymph node exam.  Pre-operative Diagnosis: left breast cancer  Post-operative Diagnosis: left breast cancer  Surgeon: Stark Klein   Assistants: none  Anesthesia: General LMA anesthesia and Local anesthesia 1% plain lidocaine, 0.25.% bupivacaine, with epinephrine  ASA Class: 2  Procedure Details  The patient was seen in the Holding Room. The risks, benefits, complications, treatment options, and expected outcomes were discussed with the patient. The possibilities of reaction to medication, pulmonary aspiration, bleeding, infection, the need for additional procedures, failure to diagnose a condition, and creating a complication requiring transfusion or operation were discussed with the patient. The patient concurred with the proposed plan, giving informed consent.  The site of surgery properly noted/marked. The patient was taken to Operating Room # 8, identified as Ana Young and the procedure verified as Left Breast needle localized partial mastectomy and Sentinel Node Biopsy, port removal. A Time Out was held and the above information confirmed. 5 cc methylene blue/saline was injected in the subareolar position.  After induction of anesthesia, the left arm, breast, and chest were prepped and draped in standard fashion.  The lumpectomy was performed by creating an oblique incision in the axilla around the previously placed localization guidewires.  Dissection was carried down to the serratus and pectoral fascia. Specimen margins were inked .  Specimen radiography confirmed inclusion of the mammographic lesion.  The gross intraoperative evaluation demonstrated fibrous tissue at the anterior margin which is skin.  Hemostasis was achieved with cautery.  The wound  was irrigated.  The incision was so high, the axilla was approached via the same incision.  Using a hand-held gamma probe, axillary sentinel nodes were identified transcutaneously.   Dissection was carried through the clavipectoral fascia.  2 level 2 axillary sentinel nodes were removed and submitted to pathology for permanent section.  See findings below.   The axillary incision was closed with 3-0 interrupted vicryl deep dermal sutures and a 4-0 monocryl subcuticular closure in layers.      The prior port incision was anesthetized with local anesthetic.  The incision was opened with a #15 blade.  The subcutaneous tissue was divided with the cautery.  The port was identified and the capsule opened.  The 4 2-0 prolene sutures were removed.  The port was then removed and pressure held on the tract.  The catheter appeared intact without evidence of breakage.  The wound was inspected for hemostasis, which was achieved with cautery.  The wound was closed with 3-0 vicryl deep dermal interrupted sutures and 4-0 Monocryl running subcuticular suture.   Dermabond was applied.  Sterile dressings were applied. At the end of the operation, all sponge, instrument, and needle counts were correct.  Findings: grossly clear surgical margins and 2 axillary sentinel lymph nodes.  Anterior margin and lateral margin are skin, medial margin is serratus and pectoral fascia, superior margin is axillary tissue.    Estimated Blood Loss:  Minimal        Specimens: left breast partial mastectomy, 2 axillary SLN.  #1 hot and blue, cps 1500; #2 hot, cps 250, background 8 cps                Complications:  None; patient tolerated the procedure well.         Disposition: PACU - hemodynamically stable.  Condition: stable

## 2013-08-16 NOTE — H&P (Signed)
  HISTORY:  Pt is a 53 yo F who was diagnosed with breast cancer in august this year. She has triple negative disease, and she has been getting neoadjuvant chemotherapy. She has had a main lesion, a satellite lesion, and a third area lateral in the left breast. Patient continues to tolerate her chemotherapy well. She has completed her neoadjuvant regimen. She has no complaints other than alopecia. She has not been able to palpate the mass now for several months. She is accompanied by her husband.   PERTINENT REVIEW OF SYSTEMS:   Otherwise negative x 11.    Filed Vitals:     07/14/13 1128    BP:  116/60    Pulse:  70    Temp:  97.8 F (36.6 C)    Resp:  16    Filed Weights     07/14/13 1128    Weight:  143 lb 9.6 oz (65.137 kg)      EXAM:  Head: Normocephalic and atraumatic.  Eyes: Conjunctivae are normal. Pupils are equal, round, and reactive to light. No scleral icterus.  Resp: No respiratory distress, normal effort.  Breast: Far left lateral breast mass is no longer palpable. There is no thickening as there was at her last exam. The tissue in the region the mass is completely soft and felt like the remaining breast tissue. No lymphadenopathy is palpable on either side.  Neurological: Alert and oriented to person, place, and time. Coordination normal.  Skin: Skin is warm and dry. No rash noted. No diaphoretic. No erythema. No pallor.  Psychiatric: Normal mood and affect. Normal behavior. Judgment and thought content normal.   ASSESSMENT AND PLAN:  Breast cancer of upper-outer quadrant of left female breast  Patient has no clinical evidence of tumor following neoadjuvant chemotherapy.  I reviewed her studies with radiology. She does appear to be a reasonable breast conservation candidate because these areas are all very close together. They also are extremely lateral almost in the anterior axillary line.  She may actually not lose that much breast tissue considering where these are  located.  In any event, if she isn't happy with her cosmetic result, we'll refer her to plastic surgery. She does very much desired breast conservation and is willing to proceed with the understanding that she may require additional surgeries.   The surgical procedure was described to the patient. I discussed the incision type and location and that we would need radiology involved on the day of surgery with a wire marker and sentinel node.  The risks and benefits of the procedure were described to the patient and she wishes to proceed.  We discussed the risks bleeding, infection, damage to other structures, need for further procedures/surgeries. We discussed the risk of seroma. The patient was advised that we may need to go back to surgery for additional tissue to obtain negative margins or for additional lymph nodes. The patient was advised that these are the most common complications, but that others can occur as well. They were advised against taking aspirin or other anti-inflammatory agents/blood thinners the week before surgery.    Milus Height, MD  Surgical Oncology, [facility]  [address] [place], [place]  [address], [place], [place]

## 2013-08-17 NOTE — Progress Notes (Signed)
Quick Note:  Please let patient know that there is no residual cancer, and LN are negative. Yeah! ______

## 2013-08-18 ENCOUNTER — Telehealth (INDEPENDENT_AMBULATORY_CARE_PROVIDER_SITE_OTHER): Payer: Self-pay

## 2013-08-18 ENCOUNTER — Encounter (HOSPITAL_BASED_OUTPATIENT_CLINIC_OR_DEPARTMENT_OTHER): Payer: Self-pay | Admitting: General Surgery

## 2013-08-18 NOTE — Telephone Encounter (Signed)
Pt returned call and given her pathology report.

## 2013-08-18 NOTE — Telephone Encounter (Signed)
LMOV for patient that pathology shows no residual cancer and lymph nodes are negative.

## 2013-08-22 ENCOUNTER — Ambulatory Visit (HOSPITAL_BASED_OUTPATIENT_CLINIC_OR_DEPARTMENT_OTHER): Payer: BC Managed Care – PPO | Admitting: Genetic Counselor

## 2013-08-22 ENCOUNTER — Encounter: Payer: Self-pay | Admitting: Genetic Counselor

## 2013-08-22 ENCOUNTER — Other Ambulatory Visit: Payer: BC Managed Care – PPO

## 2013-08-22 DIAGNOSIS — C50419 Malignant neoplasm of upper-outer quadrant of unspecified female breast: Secondary | ICD-10-CM

## 2013-08-22 DIAGNOSIS — IMO0002 Reserved for concepts with insufficient information to code with codable children: Secondary | ICD-10-CM

## 2013-08-22 DIAGNOSIS — Z803 Family history of malignant neoplasm of breast: Secondary | ICD-10-CM

## 2013-08-22 DIAGNOSIS — C50412 Malignant neoplasm of upper-outer quadrant of left female breast: Secondary | ICD-10-CM

## 2013-08-22 NOTE — Progress Notes (Signed)
Dr.  Sarajane Jews Magrinat requested a consultation for genetic counseling and risk assessment for Ana Young, a 53 y.o. female, for discussion of her personal history of breast cancer and family history of breast cancer.  Ana Young presents to clinic today to discuss the possibility of a genetic predisposition to cancer, and to further clarify her risks, as well as her family members' risks for cancer.   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 This was treated with lumpectomy, chemotherapy and Ana Young is scheduled for radiation.  Her tumor is triple negative.  Ana Young has not had a colonoscopy yet.    Past Medical History  Diagnosis Date  . Depression   . Vitamin D deficiency   . Anxiety   . Seasonal allergies   . HTN (hypertension)   . Insomnia   . History of migraines   . Cancer     Left breast cancer  . Breast cancer   . Anemia 06/07/2013  . Drug induced neutropenia(288.03) 08/02/2013    Past Surgical History  Procedure Laterality Date  . Bunionectomy Left 2009  . Portacath placement Left 03/09/2013    Procedure: INSERTION PORT-A-CATH;  Surgeon: Stark Klein, MD;  Location: [facility];  Service: General;  Laterality: Left;  . Partial mastectomy with needle localization and axillary sentinel lymph node bx Left 08/16/2013    Procedure: PARTIAL MASTECTOMY WITH NEEDLE LOCALIZATION AND AXILLARY SENTINEL LYMPH NODE BIOPSY AND PORT A CATH REMOVAL;  Surgeon: Stark Klein, MD;  Location: [facility];  Service: General;  Laterality: Left;    History   Social History  . Marital Status: Married    Spouse Name: N/A    Number of Children: 2  . Years of Education: N/A   Social History Main Topics  . Smoking status: Never Smoker   . Smokeless tobacco: Never Used  . Alcohol Use: No  . Drug Use: No  . Sexual Activity: Yes    Birth Control/ Protection: Surgical     Comment:  Husband had a vasectomy   Other Topics Concern  . None   Social History Narrative  . None    REPRODUCTIVE HISTORY AND PERSONAL RISK ASSESSMENT FACTORS: Menarche was at age 53.   perimenopausal Uterus Intact: yes Ovaries Intact: yes [id number], first live birth at age 53  Ana Young has not previously undergone treatment for infertility.   Oral Contraceptive use: 27 years   Ana Young has not used HRT in the past.    FAMILY HISTORY:  We obtained a detailed, 4-generation family history.  Significant diagnoses are listed below: Family History  Problem Relation Age of Onset  . Heart attack Brother 87  . Congestive Heart Failure Mother   . Stroke Father   . Diabetes Father   . Breast cancer Maternal Grandmother 95  Ana Young does not know any information about her father's side of the family.  Patient's maternal ancestors are of [place] and [place] descent, and paternal ancestors are of African American descent. There is no reported Ashkenazi Jewish ancestry. There is no known consanguinity.  GENETIC COUNSELING ASSESSMENT: Ana Young is a 53 y.o. female with a personal history of triple negative breast cancer and family history of breast cancer which somewhat suggestive of a hereditary cancer syndrome and predisposition to cancer. We, therefore, discussed and recommended the following at today's visit.   DISCUSSION: We reviewed the characteristics, features and inheritance patterns  of hereditary cancer syndromes. We also discussed genetic testing, including the appropriate family members to test, the process of testing, insurance coverage and turn-around-time for results. We reiviewed her personal and family history, and discussed reasons for triple negative breast cancer, including having a BRCA mutation.  The patient did not feel that her situation was such that Ana Young had a hereditary risk for cancer.  Ana Young and her husband wanted to discuss this further, and declined testing today.  PLAN:  After considering the risks, benefits, and limitations, Ana Young declined testing.  If Ana Young has questions or wants to pursue it in the future Ana Young will call.  We encouraged Ana Young to remain in contact with cancer genetics annually so that we can continuously update the family history and inform her of any changes in cancer genetics and testing that may be of benefit for her family. Ana Young's questions were answered to her satisfaction today. Our contact information was provided should additional questions or concerns arise.  The patient was seen for a total of 60 minutes, greater than 50% of which was spent face-to-face counseling.  This note will also be sent to the referring provider via the electronic medical record. The patient will be supplied with a summary of this genetic counseling discussion as well as educational information on the discussed hereditary cancer syndromes following the conclusion of their visit.   Patient was discussed with Dr. Marcy Panning.   _______________________________________________________________________ For Office Staff:  Number of people involved in session: 2 Was an Intern/ student involved with case: yes

## 2013-08-25 ENCOUNTER — Encounter (INDEPENDENT_AMBULATORY_CARE_PROVIDER_SITE_OTHER): Payer: BC Managed Care – PPO | Admitting: General Surgery

## 2013-09-05 ENCOUNTER — Encounter (INDEPENDENT_AMBULATORY_CARE_PROVIDER_SITE_OTHER): Payer: Self-pay | Admitting: General Surgery

## 2013-09-05 ENCOUNTER — Other Ambulatory Visit (INDEPENDENT_AMBULATORY_CARE_PROVIDER_SITE_OTHER): Payer: Self-pay | Admitting: General Surgery

## 2013-09-05 ENCOUNTER — Ambulatory Visit (INDEPENDENT_AMBULATORY_CARE_PROVIDER_SITE_OTHER): Payer: BC Managed Care – PPO | Admitting: General Surgery

## 2013-09-05 VITALS — BP 112/80 | HR 72 | Temp 98.8°F | Resp 12 | Ht 67.0 in | Wt 135.0 lb

## 2013-09-05 DIAGNOSIS — C50419 Malignant neoplasm of upper-outer quadrant of unspecified female breast: Secondary | ICD-10-CM

## 2013-09-05 DIAGNOSIS — C50412 Malignant neoplasm of upper-outer quadrant of left female breast: Secondary | ICD-10-CM

## 2013-09-05 DIAGNOSIS — Z853 Personal history of malignant neoplasm of breast: Secondary | ICD-10-CM

## 2013-09-05 NOTE — Progress Notes (Signed)
HISTORY: Pt is around 2-3 weeks s/o Left lumpectomy/SLN bx s/p neoadjuvant chemotherapy.  She is doing well overall except for some tightness in the left axilla.  She also has some soreness in the left upper arm.  She denies fever/ chills.  She is done with chemo.      EXAM: General:  Alert and oriented.   Incision:  Healing well.  Pt has only one incision due to location of mass.  Contracture in left armpit.     PATHOLOGY: Margins negative No residual cancer LN negative.     ASSESSMENT AND PLAN:   Breast cancer of upper-outer quadrant of left female breast No evidence of infection or seroma.  Pt does have a bit of contracture in left axilla.    Will send to ABC class and refer to PT.  Follow up in 6 months. Will need mammogram.      Milus Height, MD Surgical Oncology, [facility]  [address] [place], [place] Theotis Burrow, PA-C

## 2013-09-05 NOTE — Patient Instructions (Signed)
I would recommend that you go to ABC class, but we will also refer you to PT for the tight spot in the armpit.    Follow up in around 6 months.

## 2013-09-05 NOTE — Assessment & Plan Note (Signed)
No evidence of infection or seroma.  Pt does have a bit of contracture in left axilla.    Will send to ABC class and refer to PT.  Follow up in 6 months. Will need mammogram.

## 2013-09-07 ENCOUNTER — Other Ambulatory Visit: Payer: Self-pay | Admitting: Family Medicine

## 2013-09-07 ENCOUNTER — Ambulatory Visit: Payer: BC Managed Care – PPO | Attending: General Surgery | Admitting: Physical Therapy

## 2013-09-07 DIAGNOSIS — IMO0001 Reserved for inherently not codable concepts without codable children: Secondary | ICD-10-CM | POA: Insufficient documentation

## 2013-09-07 DIAGNOSIS — M24519 Contracture, unspecified shoulder: Secondary | ICD-10-CM | POA: Insufficient documentation

## 2013-09-07 MED ORDER — ZOLPIDEM TARTRATE 5 MG PO TABS
5.0000 mg | ORAL_TABLET | Freq: Every evening | ORAL | Status: DC | PRN
Start: 2013-09-07 — End: 2013-11-17

## 2013-09-08 ENCOUNTER — Other Ambulatory Visit: Payer: Self-pay | Admitting: *Deleted

## 2013-09-08 ENCOUNTER — Telehealth: Payer: Self-pay | Admitting: Family Medicine

## 2013-09-08 DIAGNOSIS — C50412 Malignant neoplasm of upper-outer quadrant of left female breast: Secondary | ICD-10-CM

## 2013-09-08 NOTE — Telephone Encounter (Signed)
[facility] in [place] needs Dr Ebony Hail DEA number so they can fill her Lorrin Mais Please call pt when this is done

## 2013-09-08 NOTE — Telephone Encounter (Signed)
Received a call from [facility]. Dr Venetia Maxon and Ardelia Mems both wrote an Rx for Ms Ana Young and neither of their DEA numbers are being accepted for the Ambien. I gave a verbal order to fill the Ambien under Dr Erin Hearing DEA number.Pau Banh, Kevin Fenton

## 2013-09-09 ENCOUNTER — Other Ambulatory Visit (HOSPITAL_BASED_OUTPATIENT_CLINIC_OR_DEPARTMENT_OTHER): Payer: BC Managed Care – PPO

## 2013-09-09 ENCOUNTER — Telehealth: Payer: Self-pay | Admitting: Oncology

## 2013-09-09 ENCOUNTER — Ambulatory Visit (HOSPITAL_BASED_OUTPATIENT_CLINIC_OR_DEPARTMENT_OTHER): Payer: BC Managed Care – PPO | Admitting: Oncology

## 2013-09-09 VITALS — BP 113/76 | HR 74 | Temp 98.3°F | Resp 18 | Ht 67.0 in | Wt 140.3 lb

## 2013-09-09 DIAGNOSIS — C50419 Malignant neoplasm of upper-outer quadrant of unspecified female breast: Secondary | ICD-10-CM

## 2013-09-09 DIAGNOSIS — D702 Other drug-induced agranulocytosis: Secondary | ICD-10-CM

## 2013-09-09 DIAGNOSIS — Z171 Estrogen receptor negative status [ER-]: Secondary | ICD-10-CM

## 2013-09-09 DIAGNOSIS — F329 Major depressive disorder, single episode, unspecified: Secondary | ICD-10-CM

## 2013-09-09 DIAGNOSIS — D709 Neutropenia, unspecified: Secondary | ICD-10-CM

## 2013-09-09 DIAGNOSIS — G609 Hereditary and idiopathic neuropathy, unspecified: Secondary | ICD-10-CM

## 2013-09-09 DIAGNOSIS — F411 Generalized anxiety disorder: Secondary | ICD-10-CM

## 2013-09-09 DIAGNOSIS — C50412 Malignant neoplasm of upper-outer quadrant of left female breast: Secondary | ICD-10-CM

## 2013-09-09 DIAGNOSIS — F3289 Other specified depressive episodes: Secondary | ICD-10-CM

## 2013-09-09 DIAGNOSIS — D649 Anemia, unspecified: Secondary | ICD-10-CM

## 2013-09-09 DIAGNOSIS — I10 Essential (primary) hypertension: Secondary | ICD-10-CM

## 2013-09-09 LAB — CBC WITH DIFFERENTIAL/PLATELET
BASO%: 1.2 % (ref 0.0–2.0)
BASOS ABS: 0 10*3/uL (ref 0.0–0.1)
EOS%: 1.7 % (ref 0.0–7.0)
Eosinophils Absolute: 0.1 10*3/uL (ref 0.0–0.5)
HEMATOCRIT: 36 % (ref 34.8–46.6)
HEMOGLOBIN: 11.7 g/dL (ref 11.6–15.9)
LYMPH%: 24.8 % (ref 14.0–49.7)
MCH: 30.2 pg (ref 25.1–34.0)
MCHC: 32.5 g/dL (ref 31.5–36.0)
MCV: 93.1 fL (ref 79.5–101.0)
MONO#: 0.5 10*3/uL (ref 0.1–0.9)
MONO%: 11.4 % (ref 0.0–14.0)
NEUT%: 60.9 % (ref 38.4–76.8)
NEUTROS ABS: 2.4 10*3/uL (ref 1.5–6.5)
Platelets: 260 10*3/uL (ref 145–400)
RBC: 3.87 10*6/uL (ref 3.70–5.45)
RDW: 15.7 % — ABNORMAL HIGH (ref 11.2–14.5)
WBC: 4 10*3/uL (ref 3.9–10.3)
lymph#: 1 10*3/uL (ref 0.9–3.3)

## 2013-09-09 LAB — COMPREHENSIVE METABOLIC PANEL (CC13)
ALBUMIN: 3.6 g/dL (ref 3.5–5.0)
ALK PHOS: 117 U/L (ref 40–150)
ALT: 18 U/L (ref 0–55)
AST: 15 U/L (ref 5–34)
Anion Gap: 5 mEq/L (ref 3–11)
BUN: 15.3 mg/dL (ref 7.0–26.0)
CALCIUM: 10.1 mg/dL (ref 8.4–10.4)
CHLORIDE: 107 meq/L (ref 98–109)
CO2: 29 mEq/L (ref 22–29)
Creatinine: 0.8 mg/dL (ref 0.6–1.1)
GLUCOSE: 93 mg/dL (ref 70–140)
POTASSIUM: 4.2 meq/L (ref 3.5–5.1)
Sodium: 141 mEq/L (ref 136–145)
Total Bilirubin: <0.2 mg/dL (ref 0.20–1.20)
Total Protein: 7.1 g/dL (ref 6.4–8.3)

## 2013-09-09 NOTE — Progress Notes (Signed)
ID: Ana Young OB: 1960-11-26  MR#: [id number]  CSN#:[id number]  PCP: Christa See, MD GYN:   SU: Stark Klein OTHER MD: Thea Silversmith, Dalbert Mayotte  CHIEF COMPLAINT:  Left  Breast Cancer/Neoadjuvant Chemotherapy  BREAST CANCER HISTORY:  Ana Young herself palpated a mass in her left breast 01/15/2013 and brought it to the attention of Dr. Lindell Noe, who arranged for her to have bilateral digital mammography at the [facility] Center 01/27/2013. The patient does have heterogeneously dense breasts. In the outer upper left breast there was an oval mass by mammography measuring approximately 1.5 cm, which by palpation was firm and measured approximately 1 cm. Ultrasound showed this to be hypoechoic and to measure 1.4 cm.  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  The patient then underwent bilateral breast MRI 02/28/2013. In the left breast upper-outer quadrant the mass in question measured 2.9 cm. There was a second mass lateral to it measuring 1.0 cm. There were no additional abnormal appearing lymph nodes and no evidence of internal mammary adenopathy.  In the right breast there were 2 areas of irregular enhancement in the superior central third of the breast, both measuring 9 mm. These were felt to be worrisome for a ductal carcinoma in situ.  The patient's case was discussed at the multidisciplinary breast cancer conference 03/02/2013. Her subsequent history is as detailed below.  INTERVAL HISTORY: [name] returns today accompanied by her husband Louie Casa for followup of her locally advanced left breast carcinoma. Since her last visit here she underwent left lumpectomy and sentinel lymph node sampling, on 08/16/2013. The final pathology ([id number]) showed both lymph nodes to be benign and no residual carcinoma in the biopsy site  ROS:  She did well  with the surgery, without unusual pain, fever, or bleeding. She does have a feeling of tightness over her skin and she is receiving physical therapy for this. She has only had one session. She has fair mobility of the left shoulder, however. She still has what she describes as neuropathy in her fingers and toes but is actually more irritation at the nailbeds. Of course her significant nail hyperpigmentation and dysplasia. She has very minimal numbness on the fingertips. She does have a little numbness in the right foot, more than the left. She still has some blurred vision. Otherwise a detailed review of systems today was noncontributory    PAST MEDICAL HISTORY: Past Medical History  Diagnosis Date  . Depression   . Vitamin D deficiency   . Anxiety   . Seasonal allergies   . HTN (hypertension)   . Insomnia   . History of migraines   . Cancer     Left breast cancer  . Breast cancer   . Anemia 06/07/2013  . Drug induced neutropenia(288.03) 08/02/2013    PAST SURGICAL HISTORY: Past Surgical History  Procedure Laterality Date  . Bunionectomy Left 2009  . Portacath placement Left 03/09/2013    Procedure: INSERTION PORT-A-CATH;  Surgeon: Stark Klein, MD;  Location: [facility];  Service: General;  Laterality: Left;  . Partial mastectomy with needle localization and axillary sentinel lymph node bx Left 08/16/2013    Procedure: PARTIAL MASTECTOMY WITH NEEDLE LOCALIZATION AND AXILLARY SENTINEL LYMPH NODE BIOPSY AND PORT A CATH REMOVAL;  Surgeon: Stark Klein, MD;  Location: [facility];  Service: General;  Laterality: Left;    FAMILY HISTORY Family History  Problem Relation Age of Onset  . Heart attack Brother 50  . Congestive Heart Failure Mother   . Stroke Father   . Diabetes Father   . Breast cancer Maternal Grandmother 95   the patient's parents are in their 35s. The patient has one brother, no sisters. The patient's maternal grandmother was diagnosed with  breast cancer the age of 66. There is no other history of breast cancer in the family to the patient's knowledge, and no history of ovarian cancer  GYNECOLOGIC HISTORY:  Menarche age 55, first live birth age 50, the patient is GX P2. Her periods are irregular, but still every 2-3 months, and otherwise unremarkable.  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  She is currently not working. Her husband Louie Casa works as a Nurse, learning disability. Son Dory Larsen lives in [place] and works in Scientist, research (medical). Daughter Cassia Fein is 61 year old and at home. The patient has no grandchildren. She attends the [employer]    ADVANCED DIRECTIVES: Not in place  HEALTH MAINTENANCE:  (Updated January 2015) History  Substance Use Topics  . Smoking status: Never Smoker   . Smokeless tobacco: Never Used  . Alcohol Use: No     Colonoscopy: - Never  PAP: Feb 2012, Dr. Buelah Manis  Bone density: - Never  Lipid panel:  Dec 2012, Dr. Zigmund Daniel   No Known Allergies  Current Outpatient Prescriptions  Medication Sig Dispense Refill  . ALPRAZolam (XANAX) 1 MG tablet Take 1 mg by mouth 2 (two) times daily as needed for sleep (1/2 tablet bid as needed).      Marland Kitchen buPROPion (WELLBUTRIN XL) 300 MG 24 hr tablet Take 1 tablet (300 mg total) by mouth daily.  30 tablet  3  . docusate sodium (COLACE) 100 MG capsule Take 1-2 capsules (100-200 mg total) by mouth every morning. As directed for constipation  30 capsule  1  . ibuprofen (ADVIL,MOTRIN) 800 MG tablet TAKE 1 TABLET (800 MG TOTAL) BY MOUTH EVERY 8 (EIGHT) HOURS AS NEEDED FOR PAIN.  30 tablet  2  . LORazepam (ATIVAN) 0.5 MG tablet Take 1 tablet (0.5 mg total) by mouth at bedtime as needed for anxiety (Nausea or vomiting).  30 tablet  0  . polyethylene glycol powder (MIRALAX) powder Take 17 g by mouth daily as needed (constipation).  255 g  1  .  propranolol ER (INDERAL LA) 120 MG 24 hr capsule Take 1 capsule (120 mg total) by mouth daily.  93 capsule  1  . zolpidem (AMBIEN) 5 MG tablet Take 1 tablet (5 mg total) by mouth at bedtime as needed for sleep.  30 tablet  1   No current facility-administered medications for this visit.   Facility-Administered Medications Ordered in Other Visits  Medication Dose Route Frequency Provider Last Rate Last Dose  . sodium chloride 0.9 % injection 10 mL  10 mL Intracatheter PRN Chauncey Cruel, MD   10 mL at 06/21/13 1322     Middle-aged African American woman in no acute distress Filed Vitals:   09/09/13 1107  BP: 113/76  Pulse: 74  Temp: 98.3 F (36.8 C)  Resp: 18       Body mass index is 21.97 kg/(m^2).    ECOG FS: 1 Filed Weights   09/09/13 1107  Weight: 140 lb 4.8 oz (63.64 kg)   Sclerae unicteric, pupils round and equal Oropharynx clear  and moist-- no thrush or other lesions No cervical or supraclavicular adenopathy Lungs no rales or rhonchi Heart regular rate and rhythm Abd soft, nontender, positive bowel sounds MSK no focal spinal tenderness, no upper extremity lymphedema, good left upper extremity range of motion Neuro: nonfocal, well oriented, positive affect Breasts: The right breast is unremarkable. The left breast is status post recent surgery. The incisions are healing nicely. There is induration around the axillary incision. She was reassured regarding this. The left axilla is otherwise benign Skin:  hyperpigmentation and nail dyscrasia as previously noted  LAB RESULTS:  Lab Results  Component Value Date   WBC 4.0 09/09/2013   NEUTROABS 2.4 09/09/2013   HGB 11.7 09/09/2013   HCT 36.0 09/09/2013   MCV 93.1 09/09/2013   PLT 260 09/09/2013      Chemistry      Component Value Date/Time   NA 141 09/09/2013 1041   NA 138 06/16/2011 0938   K 4.2 09/09/2013 1041   K 4.1 06/16/2011 0938   CL 110 06/16/2011 0938   CO2 29 09/09/2013 1041   CO2 26 06/16/2011 0938   BUN 15.3  09/09/2013 1041   BUN 10 06/16/2011 0938   CREATININE 0.8 09/09/2013 1041   CREATININE 0.81 06/16/2011 0938   CREATININE 0.74 07/26/2010 2332      Component Value Date/Time   CALCIUM 10.1 09/09/2013 1041   CALCIUM 9.5 06/16/2011 0938   ALKPHOS 117 09/09/2013 1041   ALKPHOS 78 06/16/2011 0938   AST 15 09/09/2013 1041   AST 13 06/16/2011 0938   ALT 18 09/09/2013 1041   ALT 10 06/16/2011 0938   BILITOT <0.20 09/09/2013 1041   BILITOT 0.5 06/16/2011 0938      STUDIES: Nm Sentinel Node Inj-no Rpt (breast)  08/16/2013   CLINICAL DATA: left breast cancer   Sulfur colloid was injected intradermally by the nuclear medicine  technologist for breast cancer sentinel node localization.    Mm Lt Plc Breast Loc Dev   1st Lesion  Inc Mammo Guide  08/16/2013   CLINICAL DATA:  Known left breast cancer with adjacent satellite lesion.  EXAM: NEEDLE LOCALIZATION OF THE LEFT BREAST WITH MAMMOGRAPHIC GUIDANCE X TWO  COMPARISON:  Previous exams.  FINDINGS: Patient presents for needle localization prior to lumpectomy. I met with the patient and we discussed the procedure of needle localization including benefits and alternatives. We discussed the high likelihood of a successful procedure. We discussed the risks of the procedure, including infection, bleeding, tissue injury, and further surgery. Informed, written consent was given. The usual time-out protocol was performed immediately prior to the procedure.  Using mammographic guidance, sterile technique, 2% lidocaine, and 2 5 cm modified Kopans needles, the 2 biopsy sites in the left upper outer quadrant posteriorly were localized using a lateromedial approach. Films were labeled and sent with the patient to surgery. She tolerated the procedure well.  Specimen radiograph was performed at [facility] and confirms the 2 clips in the 2 wires to be present in the tissue sample. The specimen was marked for pathology.  IMPRESSION: Needle localization left breast. No  apparent complications.   Electronically Signed   By: Ulyess Blossom M.D.   On: 08/16/2013 11:14   Mm Lt Plc Breast Loc Dev   Ea Add Lesion  Inc Mammo Guide  08/16/2013   CLINICAL DATA:  Known left breast cancer with adjacent satellite lesion.  EXAM: NEEDLE LOCALIZATION OF THE LEFT BREAST WITH MAMMOGRAPHIC GUIDANCE X TWO  COMPARISON:  Previous exams.  FINDINGS: Patient presents for needle localization prior to lumpectomy. I met with the patient and we discussed the procedure of needle localization including benefits and alternatives. We discussed the high likelihood of a successful procedure. We discussed the risks of the procedure, including infection, bleeding, tissue injury, and further surgery. Informed, written consent was given. The usual time-out protocol was performed immediately prior to the procedure.  Using mammographic guidance, sterile technique, 2% lidocaine, and 2 5 cm modified Kopans needles, the 2 biopsy sites in the left upper outer quadrant posteriorly were localized using a lateromedial approach. Films were labeled and sent with the patient to surgery. She tolerated the procedure well.  Specimen radiograph was performed at [facility] and confirms the 2 clips in the 2 wires to be present in the tissue sample. The specimen was marked for pathology.  IMPRESSION: Needle localization left breast. No apparent complications.   Electronically Signed   By: Ulyess Blossom M.D.   On: 08/16/2013 11:14    ASSESSMENT: 53 y.o. 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    (2) MRI on 03/02/2013 showed an additional nodule at the 2:00 position, 5 mm lateral to the primary in the left breast, measuring 10 x 6 x 9 mm. This was biopsied on 03/11/2013 and was consistent with high-grade carcinoma [phone]). Prognostic panel is  pending.   (3) MRI on 03/02/2013 also showed 2 areas of irregularity in the right breast which were worrisome for possible DCIS. Both of these areas were biopsied on 03/04/2013, and both showed fibrocystic changes with no evidence of malignancy ([id number]).   (4) genetics testing declined by patient after consultation with genetics counselor to 16 2015.  (5)  treated in the neoadjuvant setting with   (a) 4 dose dense cycles of doxorubicin/ cyclophosphamide, with the first dose given on 03/15/2013 and the final one 04/26/2013, with a complete radiologic response  (b) followed by 10 weekly doses of carboplatin/paclitaxel started 05/17/2013, completed 07/19/2013, final two doses omitted because of neuropathy and cytopenias   (6) left lumpectomy and sentinel lymph node sampling 08/16/2013 shows a complete pathologic response.  (7) radiation therapy to follow  PLAN: Adeola did well with her surgery and she had the best possible results for her chemotherapy. She still has some peripheral neuropathy bitterly involving the feet, right greater than left, and a little bit at her fingertips. Actually most of what she reports is nail irritation with discomfort at the base of the nails and of course nail dysplasia as a posterior numbness and tingling. Hopefully accordingly that we'll completely clear.  She is now ready for radiation treatments. I anticipate she will complete that within 2 months, and we will see her around that time to start long-term followup.  I was glad to be able to tell her that patients who have a complete pathologic response have an excellent prognosis as compared to similar patients who do not obtain a complete pathologic response. She knows to call for any problems that may develop before her next visit here.  Chauncey Cruel, MD   09/09/2013

## 2013-09-09 NOTE — Telephone Encounter (Signed)
gv pt appt schedule for may. karen from [facility] will call me back re appt w/dr wentworth. pt aware i will call her re appt w/dr wentworth.

## 2013-09-14 ENCOUNTER — Ambulatory Visit: Payer: BC Managed Care – PPO

## 2013-09-19 ENCOUNTER — Ambulatory Visit: Payer: BC Managed Care – PPO

## 2013-09-19 ENCOUNTER — Other Ambulatory Visit: Payer: Self-pay | Admitting: *Deleted

## 2013-09-19 DIAGNOSIS — C50412 Malignant neoplasm of upper-outer quadrant of left female breast: Secondary | ICD-10-CM

## 2013-09-20 NOTE — Progress Notes (Signed)
Location of Breast Cancer:Left invasive ductal carcinoma , grade 3.    Histology per Pathology Report: 03/11/2013 Diagnosis Breast, left, needle core biopsy, mass 2 o'clock - HIGH GRADE CARCINOMA. - SEE MICROSCOPIC DESCRIPTION. 08/16/2013 Diagnosis 1. Breast, lumpectomy, left mass - BIOPSY SITE REACTION ASSOCIATED WITH FIBROSIS AND PATCHY MILD CHRONIC INFLAMMATION. - FIBROCYSTIC CHANGES. - NO RESIDUAL CARCINOMA IDENTIFIED. - FINAL MARGINS CLEAR. 2. Lymph node, sentinel, biopsy, Left Axillary #1 - ONE BENIGN LYMPH NODE WITH PIGMENT CONSISTENT WITH DERMATOPATHIC CHANGES. NO TUMOR IDENTIFIED. 3. Lymph node, sentinel, biopsy, Left Axillary #2 - ONE BENIGN LYMPH NODE WITH PIGMENT CONSISTENT WITH DERMATOPATHIC CHANGES. NO TUMOR IDENTIFIED. Microscopic Comment  Receptor Status: ER(-), PR (-), Her2-neu non-amplified  Did patient present with symptoms (if so, please note symptoms) or was this found on screening mammography?Mass palpated by patient.Mammogram showed 1.4 cm mass with adjacent 1/2 cm nodule on 01/27/13. Bilateral breast mri on 08/04/13  Past/Anticipated interventions by surgeon, if TCN:GFRE lumpectomy with sentinel lymph node sampling on 08/16/2013.  Past/Anticipated interventions by medical oncology, if any: Chemotherapy:doxorubicin/cyclophosphamide x 4 doses and carboplatin/paclitaxel   Lymphedema issues, if any:No  Pain issues, if any:No  SAFETY ISSUES:  Prior radiation?No   Pacemaker/ICD?No  Possible current pregnancy?No  Is the patient on methotrexate? No  Current Complaints / other details:Married.Menarche age 22, first full-term birth age 76. GXP2,Still has menstrual cycle.Children ages 68 and 75. Concerned about how long it takes to get disability.    Arlyss Repress, RN 09/20/2013,3:57 PM

## 2013-09-21 ENCOUNTER — Ambulatory Visit
Admission: RE | Admit: 2013-09-21 | Discharge: 2013-09-21 | Disposition: A | Discharge status: Home / Self Care | Payer: BC Managed Care – PPO | Source: Ambulatory Visit | Attending: Radiation Oncology | Admitting: Radiation Oncology

## 2013-09-21 ENCOUNTER — Encounter: Payer: Self-pay | Admitting: Radiation Oncology

## 2013-09-21 ENCOUNTER — Ambulatory Visit: Payer: BC Managed Care – PPO

## 2013-09-21 VITALS — BP 116/74 | HR 80 | Temp 97.5°F | Wt 140.5 lb

## 2013-09-21 DIAGNOSIS — C50412 Malignant neoplasm of upper-outer quadrant of left female breast: Secondary | ICD-10-CM

## 2013-09-21 DIAGNOSIS — C50419 Malignant neoplasm of upper-outer quadrant of unspecified female breast: Secondary | ICD-10-CM | POA: Insufficient documentation

## 2013-09-21 DIAGNOSIS — Z510 Encounter for antineoplastic radiation therapy: Secondary | ICD-10-CM | POA: Insufficient documentation

## 2013-09-21 NOTE — Progress Notes (Signed)
Please see the Nurse Progress Note in the MD Initial Consult Encounter for this patient. 

## 2013-09-21 NOTE — Progress Notes (Addendum)
Name: Ana Young   MRN: [id number]  Date:  09/21/2013  DOB: 1961-03-17  Status:outpatient    DIAGNOSIS: Breast cancer.  CONSENT VERIFIED: yes   SET UP: Patient is setup supine   IMMOBILIZATION:  The following immobilization was used:Custom Moldable Pillow, breast board.   NARRATIVE: [name] was brought to the [facility].  Identity was confirmed.  All relevant records and images related to the planned course of therapy were reviewed.  Then, the patient was positioned in a stable reproducible clinical set-up for radiation therapy.  Wires were placed to delineate the clinical extent of breast tissue. A wire was placed on the scar as well.  CT images were obtained.  An isocenter was placed. Skin markings were placed.  The CT images were loaded into the planning software where the target and avoidance structures were contoured.  The radiation prescription was entered and confirmed. The patient was discharged in stable condition and tolerated simulation well.    TREATMENT PLANNING NOTE:  Treatment planning then occurred. I have requested : MLC's, isodose plan, basic dose calculation  I personally designed and supervised the construction of 5 medically necessary complex treatment devices for the protection of critical normal structures including the lungs and contralateral breast as well as the immobilization device which is necessary for set up certainty.

## 2013-09-21 NOTE — Progress Notes (Signed)
Department of Radiation Oncology  Phone:  [phone] Fax:        [phone]   Name: Ana Young MRN: [id number]  DOB: 12-Jul-1960  Date: 09/21/2013  Follow Up Visit Note  Diagnosis: T1cN0 triple negative left breast cancer  Interval History: Ana Young presents today for routine followup.  She had neoadjuvant chemotherapy and tolerated that well. She received 4 cycles of AC followed by TC. She underwent lumpectomy and sentinel lymph node sampling on 08/16/13 which revealed a complete pathologic response.  2 lymph nodes were negative for tumor.  She is ready to proceed with radiation. She is accompanied by her husband. She is seeing physical therapy for some cording in her left axilla. She has healed up well.   Allergies: No Known Allergies  Medications:  Current Outpatient Prescriptions  Medication Sig Dispense Refill  . ALPRAZolam (XANAX) 1 MG tablet Take 1 mg by mouth 2 (two) times daily as needed for sleep (1/2 tablet bid as needed).      Marland Kitchen buPROPion (WELLBUTRIN XL) 300 MG 24 hr tablet Take 1 tablet (300 mg total) by mouth daily.  30 tablet  3  . docusate sodium (COLACE) 100 MG capsule Take 1-2 capsules (100-200 mg total) by mouth every morning. As directed for constipation  30 capsule  1  . ibuprofen (ADVIL,MOTRIN) 800 MG tablet TAKE 1 TABLET (800 MG TOTAL) BY MOUTH EVERY 8 (EIGHT) HOURS AS NEEDED FOR PAIN.  30 tablet  2  . LORazepam (ATIVAN) 0.5 MG tablet Take 1 tablet (0.5 mg total) by mouth at bedtime as needed for anxiety (Nausea or vomiting).  30 tablet  0  . propranolol ER (INDERAL LA) 120 MG 24 hr capsule Take 1 capsule (120 mg total) by mouth daily.  93 capsule  1  . zolpidem (AMBIEN) 5 MG tablet Take 1 tablet (5 mg total) by mouth at bedtime as needed for sleep.  30 tablet  1  . polyethylene glycol powder (MIRALAX) powder Take 17 g by mouth daily as needed (constipation).  255 g  1   No current facility-administered medications for this encounter.    Facility-Administered Medications Ordered in Other Encounters  Medication Dose Route Frequency Provider Last Rate Last Dose  . sodium chloride 0.9 % injection 10 mL  10 mL Intracatheter PRN Chauncey Cruel, MD   10 mL at 06/21/13 1322    Physical Exam:  Filed Vitals:   09/21/13 0817  BP: 116/74  Pulse: 80  Temp: 97.5 F (36.4 C)  Weight: 140 lb 8 oz (63.73 kg)   No skin changes. Alert and oriented x 3. Alopecia.   IMPRESSION: Ana Young is a 53 y.o. female s/p neoadjuvant chemo wpth path CR, now ready for radiation  PLAN:  We discussed the role of radiation and decreasing local failures in patients who undergo lumpectomy. We discussed the retrospective data showing an increase in failure rates in patients who have a pathologic complete response and did not undergo radiation. For this reason I have recommended radiation to the whole breast followed by boost to the tumor bed. We discussed the process of simulation the placement tattoos. We discussed possible side effects during treatment including but not limited to skin irritation darkness and fatigue. We discussed long-term effects of treatment which are extremely unlikely but possible including damage to the lungs and ribs. We discussed the low likelihood of secondary malignancies. We discussed the use of breath hold technique for cardiac sparing. I let her know that nursing would be doing  skin teaching with her. She signed informed consent and agree to proceed forward. We were able to fit her in for simulation today.      Thea Silversmith, MD

## 2013-09-27 ENCOUNTER — Ambulatory Visit: Payer: BC Managed Care – PPO

## 2013-09-28 ENCOUNTER — Ambulatory Visit
Admission: RE | Admit: 2013-09-28 | Discharge: 2013-09-28 | Disposition: A | Discharge status: Home / Self Care | Payer: BC Managed Care – PPO | Source: Ambulatory Visit | Attending: Radiation Oncology | Admitting: Radiation Oncology

## 2013-09-28 DIAGNOSIS — C50412 Malignant neoplasm of upper-outer quadrant of left female breast: Secondary | ICD-10-CM

## 2013-09-28 NOTE — Progress Notes (Signed)
  Radiation Oncology         [phone] ________________________________  Name: Ana Young MRN: [id number]  Date: 09/28/2013  DOB: 1961-01-18  Simulation Verification Note  Status: outpatient  NARRATIVE: The patient was brought to the treatment unit and placed in the planned treatment position. The clinical setup was verified. Then port films were obtained and uploaded to the radiation oncology medical record software.  The treatment beams were carefully compared against the planned radiation fields. The position location and shape of the radiation fields was reviewed. The targeted volume of tissue appears appropriately covered by the radiation beams. Organs at risk appear to be excluded as planned.  Based on my personal review, I approved the simulation verification. The patient's treatment will proceed as planned.  ------------------------------------------------  Thea Silversmith, MD

## 2013-09-29 ENCOUNTER — Ambulatory Visit: Payer: BC Managed Care – PPO | Admitting: Physical Therapy

## 2013-09-29 ENCOUNTER — Ambulatory Visit
Admission: RE | Admit: 2013-09-29 | Discharge: 2013-09-29 | Disposition: A | Discharge status: Home / Self Care | Payer: BC Managed Care – PPO | Source: Ambulatory Visit | Attending: Radiation Oncology | Admitting: Radiation Oncology

## 2013-09-30 ENCOUNTER — Ambulatory Visit: Admission: RE | Admit: 2013-09-30 | Payer: BC Managed Care – PPO | Source: Ambulatory Visit

## 2013-10-03 ENCOUNTER — Ambulatory Visit: Payer: BC Managed Care – PPO

## 2013-10-03 ENCOUNTER — Ambulatory Visit: Payer: BC Managed Care – PPO | Admitting: Physical Therapy

## 2013-10-04 ENCOUNTER — Ambulatory Visit
Admission: RE | Admit: 2013-10-04 | Discharge: 2013-10-04 | Disposition: A | Discharge status: Home / Self Care | Payer: BC Managed Care – PPO | Source: Ambulatory Visit | Attending: Radiation Oncology | Admitting: Radiation Oncology

## 2013-10-04 ENCOUNTER — Ambulatory Visit: Payer: BC Managed Care – PPO

## 2013-10-04 DIAGNOSIS — C50412 Malignant neoplasm of upper-outer quadrant of left female breast: Secondary | ICD-10-CM

## 2013-10-04 NOTE — Progress Notes (Signed)
Ms. Ana Young underwent resimulation today.  She had a difficult time reproducing her set up on the treatment table so we brought her back, lowered the breast board, remade her vacloc and checked for her comfort. A repeat CT scan was taken.  She will be replanned.

## 2013-10-05 ENCOUNTER — Encounter: Payer: BC Managed Care – PPO | Admitting: Physical Therapy

## 2013-10-05 ENCOUNTER — Ambulatory Visit: Admission: RE | Admit: 2013-10-05 | Payer: BC Managed Care – PPO | Source: Ambulatory Visit

## 2013-10-05 ENCOUNTER — Ambulatory Visit: Payer: BC Managed Care – PPO | Attending: General Surgery | Admitting: Physical Therapy

## 2013-10-05 DIAGNOSIS — IMO0001 Reserved for inherently not codable concepts without codable children: Secondary | ICD-10-CM | POA: Diagnosis present

## 2013-10-05 DIAGNOSIS — M24519 Contracture, unspecified shoulder: Secondary | ICD-10-CM | POA: Insufficient documentation

## 2013-10-06 ENCOUNTER — Ambulatory Visit: Payer: BC Managed Care – PPO | Admitting: Radiation Oncology

## 2013-10-06 ENCOUNTER — Ambulatory Visit
Admission: RE | Admit: 2013-10-06 | Payer: BC Managed Care – PPO | Source: Ambulatory Visit | Admitting: Radiation Oncology

## 2013-10-06 ENCOUNTER — Ambulatory Visit: Payer: BC Managed Care – PPO

## 2013-10-07 ENCOUNTER — Ambulatory Visit: Payer: BC Managed Care – PPO | Admitting: Radiation Oncology

## 2013-10-07 ENCOUNTER — Ambulatory Visit
Admission: RE | Admit: 2013-10-07 | Discharge: 2013-10-07 | Disposition: A | Discharge status: Home / Self Care | Payer: BC Managed Care – PPO | Source: Ambulatory Visit | Attending: Radiation Oncology | Admitting: Radiation Oncology

## 2013-10-07 DIAGNOSIS — C50412 Malignant neoplasm of upper-outer quadrant of left female breast: Secondary | ICD-10-CM

## 2013-10-07 NOTE — Progress Notes (Signed)
  Radiation Oncology         [phone] ________________________________  Name: Ana Young MRN: [id number]  Date: 10/07/2013  DOB: 1961/03/16  Simulation Verification Note  Status: outpatient  NARRATIVE: The patient was brought to the treatment unit and placed in the planned treatment position. The clinical setup was verified. Then port films were obtained and uploaded to the radiation oncology medical record software.  The treatment beams were carefully compared against the planned radiation fields. The position location and shape of the radiation fields was reviewed. The targeted volume of tissue appears appropriately covered by the radiation beams. Organs at risk appear to be excluded as planned.  Based on my personal review, I approved the simulation verification. The patient's treatment will proceed as planned.  ------------------------------------------------  Thea Silversmith, MD

## 2013-10-10 ENCOUNTER — Ambulatory Visit
Admission: RE | Admit: 2013-10-10 | Discharge: 2013-10-10 | Disposition: A | Discharge status: Home / Self Care | Payer: BC Managed Care – PPO | Source: Ambulatory Visit | Attending: Radiation Oncology | Admitting: Radiation Oncology

## 2013-10-11 ENCOUNTER — Ambulatory Visit: Payer: BC Managed Care – PPO

## 2013-10-11 ENCOUNTER — Encounter: Payer: Self-pay | Admitting: Radiation Oncology

## 2013-10-11 ENCOUNTER — Ambulatory Visit
Admission: RE | Admit: 2013-10-11 | Discharge: 2013-10-11 | Disposition: A | Discharge status: Home / Self Care | Payer: BC Managed Care – PPO | Source: Ambulatory Visit | Attending: Radiation Oncology | Admitting: Radiation Oncology

## 2013-10-11 ENCOUNTER — Encounter: Payer: BC Managed Care – PPO | Admitting: Physical Therapy

## 2013-10-11 VITALS — BP 127/85 | HR 77 | Resp 16 | Wt 138.5 lb

## 2013-10-11 DIAGNOSIS — C50412 Malignant neoplasm of upper-outer quadrant of left female breast: Secondary | ICD-10-CM

## 2013-10-11 DIAGNOSIS — IMO0001 Reserved for inherently not codable concepts without codable children: Secondary | ICD-10-CM | POA: Diagnosis not present

## 2013-10-11 MED ORDER — ALRA NON-METALLIC DEODORANT (RAD-ONC)
1.0000 "application " | Freq: Once | TOPICAL | Status: AC
  Administered 2013-10-11: 1 via TOPICAL

## 2013-10-11 MED ORDER — RADIAPLEXRX EX GEL
Freq: Once | CUTANEOUS | Status: AC
  Administered 2013-10-11: 13:00:00 via TOPICAL

## 2013-10-11 NOTE — Progress Notes (Signed)
Weekly Management Note Current Dose:  5.4 Gy  Projected Dose: 61 Gy   Narrative:  The patient presents for routine under treatment assessment.  CBCT/MVCT images/Port film x-rays were reviewed.  The chart was checked. Doing well. More comfortable with new position. RN education performed. No complaints. Would like to talk to SW about disability application  Physical Findings: Weight: 138 lb 8 oz (62.823 kg). Unchanged  Impression:  The patient is tolerating radiation.  Plan:  Continue treatment as planned. Will ask SW to see pt.

## 2013-10-11 NOTE — Progress Notes (Signed)
No skin changes noted within treatment field. Denies fatigue. Reports more frequent nausea. Oriented patient to staff and routing of the clinic. Provided patient with radiation therapy and you handbook then, review pertinent information. Educated patient reference potential side effects and management such as, fatigue and skin changes. Provided patient with radiaplex and alra then directed upon use. Allowed patient opportunity to ask questions. Patient verbalized understanding of all reviewed.

## 2013-10-12 ENCOUNTER — Ambulatory Visit
Admission: RE | Admit: 2013-10-12 | Discharge: 2013-10-12 | Disposition: A | Discharge status: Home / Self Care | Payer: BC Managed Care – PPO | Source: Ambulatory Visit | Attending: Radiation Oncology | Admitting: Radiation Oncology

## 2013-10-13 ENCOUNTER — Ambulatory Visit: Payer: BC Managed Care – PPO

## 2013-10-13 ENCOUNTER — Ambulatory Visit
Admission: RE | Admit: 2013-10-13 | Discharge: 2013-10-13 | Disposition: A | Discharge status: Home / Self Care | Payer: BC Managed Care – PPO | Source: Ambulatory Visit | Attending: Radiation Oncology | Admitting: Radiation Oncology

## 2013-10-13 DIAGNOSIS — IMO0001 Reserved for inherently not codable concepts without codable children: Secondary | ICD-10-CM | POA: Diagnosis not present

## 2013-10-14 ENCOUNTER — Ambulatory Visit: Payer: BC Managed Care – PPO

## 2013-10-17 ENCOUNTER — Ambulatory Visit
Admission: RE | Admit: 2013-10-17 | Discharge: 2013-10-17 | Disposition: A | Discharge status: Home / Self Care | Payer: BC Managed Care – PPO | Source: Ambulatory Visit | Attending: Radiation Oncology | Admitting: Radiation Oncology

## 2013-10-18 ENCOUNTER — Ambulatory Visit
Admission: RE | Admit: 2013-10-18 | Discharge: 2013-10-18 | Disposition: A | Discharge status: Home / Self Care | Payer: BC Managed Care – PPO | Source: Ambulatory Visit | Attending: Radiation Oncology | Admitting: Radiation Oncology

## 2013-10-18 ENCOUNTER — Ambulatory Visit: Payer: BC Managed Care – PPO | Admitting: Radiation Oncology

## 2013-10-18 ENCOUNTER — Ambulatory Visit: Payer: BC Managed Care – PPO

## 2013-10-18 DIAGNOSIS — C50412 Malignant neoplasm of upper-outer quadrant of left female breast: Secondary | ICD-10-CM

## 2013-10-18 DIAGNOSIS — IMO0001 Reserved for inherently not codable concepts without codable children: Secondary | ICD-10-CM | POA: Diagnosis not present

## 2013-10-18 NOTE — Progress Notes (Signed)
Patient for weekly assessment of radiation to left breast.Completed 7 treatments.No skin changes.No concerns today.Reinforced where to apply radiaplex.Denies pain.

## 2013-10-18 NOTE — Progress Notes (Signed)
Weekly Management Note Current Dose:  12.6 Gy  Projected Dose: 46.8 Gy   Narrative:  The patient presents for routine under treatment assessment.  CBCT/MVCT images/Port film x-rays were reviewed.  The chart was checked. Doing well. No complaints.   Physical Findings: Weight:  . Unchanged skin.   Impression:  The patient is tolerating radiation.  Plan:  Continue treatment as planned. Continue RT. Continue radiaplex.

## 2013-10-19 ENCOUNTER — Ambulatory Visit: Payer: BC Managed Care – PPO

## 2013-10-19 ENCOUNTER — Ambulatory Visit
Admission: RE | Admit: 2013-10-19 | Discharge: 2013-10-19 | Disposition: A | Discharge status: Home / Self Care | Payer: BC Managed Care – PPO | Source: Ambulatory Visit | Attending: Radiation Oncology | Admitting: Radiation Oncology

## 2013-10-19 DIAGNOSIS — IMO0001 Reserved for inherently not codable concepts without codable children: Secondary | ICD-10-CM | POA: Diagnosis not present

## 2013-10-20 ENCOUNTER — Ambulatory Visit
Admission: RE | Admit: 2013-10-20 | Discharge: 2013-10-20 | Disposition: A | Discharge status: Home / Self Care | Payer: BC Managed Care – PPO | Source: Ambulatory Visit | Attending: Radiation Oncology | Admitting: Radiation Oncology

## 2013-10-21 ENCOUNTER — Ambulatory Visit
Admission: RE | Admit: 2013-10-21 | Discharge: 2013-10-21 | Disposition: A | Discharge status: Home / Self Care | Payer: BC Managed Care – PPO | Source: Ambulatory Visit | Attending: Radiation Oncology | Admitting: Radiation Oncology

## 2013-10-24 ENCOUNTER — Ambulatory Visit
Admission: RE | Admit: 2013-10-24 | Discharge: 2013-10-24 | Disposition: A | Discharge status: Home / Self Care | Payer: BC Managed Care – PPO | Source: Ambulatory Visit | Attending: Radiation Oncology | Admitting: Radiation Oncology

## 2013-10-25 ENCOUNTER — Ambulatory Visit
Admission: RE | Admit: 2013-10-25 | Discharge: 2013-10-25 | Disposition: A | Discharge status: Home / Self Care | Payer: BC Managed Care – PPO | Source: Ambulatory Visit | Attending: Radiation Oncology | Admitting: Radiation Oncology

## 2013-10-25 VITALS — BP 120/76 | HR 76 | Temp 97.9°F | Wt 145.3 lb

## 2013-10-25 DIAGNOSIS — C50412 Malignant neoplasm of upper-outer quadrant of left female breast: Secondary | ICD-10-CM

## 2013-10-25 NOTE — Progress Notes (Signed)
Weekly assessment of radiation to left breat.completed treatment number 12 today.Skin is mildly discolored.Denies breast pain but does have generalized achiness of body over the last 2 weeks especially when walking.

## 2013-10-25 NOTE — Progress Notes (Signed)
Weekly Management Note Current Dose: 21.6  Gy  Projected Dose:61  Gy   Narrative:  The patient presents for routine under treatment assessment.  CBCT/MVCT images/Port film x-rays were reviewed.  The chart was checked. Doing well. Skin slightly dark. Nails coming off.  Physical Findings: Weight: 145 lb 4.8 oz (65.908 kg). Slightly dark breast.   Impression:  The patient is tolerating radiation.  Plan:  Continue treatment as planned. Continue radiaplex.

## 2013-10-26 ENCOUNTER — Ambulatory Visit
Admission: RE | Admit: 2013-10-26 | Discharge: 2013-10-26 | Disposition: A | Discharge status: Home / Self Care | Payer: BC Managed Care – PPO | Source: Ambulatory Visit | Attending: Radiation Oncology | Admitting: Radiation Oncology

## 2013-10-27 ENCOUNTER — Ambulatory Visit
Admission: RE | Admit: 2013-10-27 | Discharge: 2013-10-27 | Disposition: A | Discharge status: Home / Self Care | Payer: BC Managed Care – PPO | Source: Ambulatory Visit | Attending: Radiation Oncology | Admitting: Radiation Oncology

## 2013-10-28 ENCOUNTER — Ambulatory Visit
Admission: RE | Admit: 2013-10-28 | Discharge: 2013-10-28 | Disposition: A | Discharge status: Home / Self Care | Payer: BC Managed Care – PPO | Source: Ambulatory Visit | Attending: Radiation Oncology | Admitting: Radiation Oncology

## 2013-10-31 ENCOUNTER — Ambulatory Visit
Admission: RE | Admit: 2013-10-31 | Discharge: 2013-10-31 | Disposition: A | Discharge status: Home / Self Care | Payer: BC Managed Care – PPO | Source: Ambulatory Visit | Attending: Radiation Oncology | Admitting: Radiation Oncology

## 2013-11-01 ENCOUNTER — Ambulatory Visit: Payer: BC Managed Care – PPO

## 2013-11-01 ENCOUNTER — Encounter: Payer: Self-pay | Admitting: Radiation Oncology

## 2013-11-01 ENCOUNTER — Ambulatory Visit
Admission: RE | Admit: 2013-11-01 | Discharge: 2013-11-01 | Disposition: A | Discharge status: Home / Self Care | Payer: BC Managed Care – PPO | Source: Ambulatory Visit | Attending: Radiation Oncology | Admitting: Radiation Oncology

## 2013-11-01 VITALS — BP 123/84 | HR 76 | Temp 97.6°F | Resp 20 | Wt 146.5 lb

## 2013-11-01 DIAGNOSIS — IMO0001 Reserved for inherently not codable concepts without codable children: Secondary | ICD-10-CM | POA: Diagnosis not present

## 2013-11-01 DIAGNOSIS — C50412 Malignant neoplasm of upper-outer quadrant of left female breast: Secondary | ICD-10-CM

## 2013-11-01 NOTE — Progress Notes (Signed)
Pt denies pain, fatigue, loss of appetite. She is applying Radiaplex to left breast treatment area for skin darkening. She denies skin discomfort.

## 2013-11-01 NOTE — Progress Notes (Signed)
Weekly Management Note Current Dose:30.6   Gy  Projected Dose: 62.8 Gy   Narrative:  The patient presents for routine under treatment assessment.  CBCT/MVCT images/Port film x-rays were reviewed.  The chart was checked. Doing well. No complaints.   Physical Findings: Weight: 146 lb 8 oz (66.452 kg). Slightly dark left breast  Impression:  The patient is tolerating radiation.  Plan:  Continue treatment as planned. Continue radiaplex. Discussed need for treatments and rationale behind fractionation.

## 2013-11-02 ENCOUNTER — Ambulatory Visit
Admission: RE | Admit: 2013-11-02 | Discharge: 2013-11-02 | Disposition: A | Discharge status: Home / Self Care | Payer: BC Managed Care – PPO | Source: Ambulatory Visit | Attending: Radiation Oncology | Admitting: Radiation Oncology

## 2013-11-03 ENCOUNTER — Ambulatory Visit: Payer: BC Managed Care – PPO

## 2013-11-03 ENCOUNTER — Ambulatory Visit: Payer: BC Managed Care – PPO | Admitting: Physical Therapy

## 2013-11-03 DIAGNOSIS — IMO0001 Reserved for inherently not codable concepts without codable children: Secondary | ICD-10-CM | POA: Diagnosis not present

## 2013-11-04 ENCOUNTER — Ambulatory Visit
Admission: RE | Admit: 2013-11-04 | Discharge: 2013-11-04 | Disposition: A | Discharge status: Home / Self Care | Payer: BC Managed Care – PPO | Source: Ambulatory Visit | Attending: Radiation Oncology | Admitting: Radiation Oncology

## 2013-11-07 ENCOUNTER — Ambulatory Visit: Payer: BC Managed Care – PPO | Admitting: Radiation Oncology

## 2013-11-07 ENCOUNTER — Ambulatory Visit
Admission: RE | Admit: 2013-11-07 | Discharge: 2013-11-07 | Disposition: A | Discharge status: Home / Self Care | Payer: BC Managed Care – PPO | Source: Ambulatory Visit | Attending: Radiation Oncology | Admitting: Radiation Oncology

## 2013-11-08 ENCOUNTER — Ambulatory Visit
Admission: RE | Admit: 2013-11-08 | Discharge: 2013-11-08 | Disposition: A | Discharge status: Home / Self Care | Payer: BC Managed Care – PPO | Source: Ambulatory Visit | Attending: Radiation Oncology | Admitting: Radiation Oncology

## 2013-11-08 ENCOUNTER — Ambulatory Visit
Admission: RE | Admit: 2013-11-08 | Payer: BC Managed Care – PPO | Source: Ambulatory Visit | Admitting: Radiation Oncology

## 2013-11-08 ENCOUNTER — Ambulatory Visit: Payer: BC Managed Care – PPO | Attending: General Surgery

## 2013-11-08 DIAGNOSIS — C50412 Malignant neoplasm of upper-outer quadrant of left female breast: Secondary | ICD-10-CM

## 2013-11-08 DIAGNOSIS — IMO0001 Reserved for inherently not codable concepts without codable children: Secondary | ICD-10-CM | POA: Insufficient documentation

## 2013-11-08 DIAGNOSIS — M24519 Contracture, unspecified shoulder: Secondary | ICD-10-CM | POA: Insufficient documentation

## 2013-11-08 MED ORDER — ALRA NON-METALLIC DEODORANT (RAD-ONC)
1.0000 "application " | Freq: Once | TOPICAL | Status: AC
  Administered 2013-11-08: 1 via TOPICAL

## 2013-11-08 NOTE — Progress Notes (Signed)
Weekly Management Note Current Dose:  39.6 Gy  Projected Dose: 62.4 Gy   Narrative:  The patient presents for routine under treatment assessment.  CBCT/MVCT images/Port film x-rays were reviewed.  The chart was checked. Doing well. Saw on tx machine for electron set up.   Physical Findings: Skin is slightly dark. No desquamation.   Impression:  The patient is tolerating radiation.  Plan:  Continue treatment as planned. Deflate bag on Mon/Tues for clearance for ebeam boost. Continue radiaplex.

## 2013-11-08 NOTE — Addendum Note (Signed)
Encounter addended by: Arlyss Repress, RN on: 11/08/2013  4:38 PM<BR>     Documentation filed: Inpatient MAR

## 2013-11-08 NOTE — Addendum Note (Signed)
Encounter addended by: Arlyss Repress, RN on: 11/08/2013  4:13 PM<BR>     Documentation filed: Orders

## 2013-11-09 ENCOUNTER — Ambulatory Visit
Admission: RE | Admit: 2013-11-09 | Discharge: 2013-11-09 | Disposition: A | Discharge status: Home / Self Care | Payer: BC Managed Care – PPO | Source: Ambulatory Visit | Attending: Radiation Oncology | Admitting: Radiation Oncology

## 2013-11-10 ENCOUNTER — Ambulatory Visit: Payer: BC Managed Care – PPO

## 2013-11-10 ENCOUNTER — Ambulatory Visit
Admission: RE | Admit: 2013-11-10 | Discharge: 2013-11-10 | Disposition: A | Discharge status: Home / Self Care | Payer: BC Managed Care – PPO | Source: Ambulatory Visit | Attending: Radiation Oncology | Admitting: Radiation Oncology

## 2013-11-11 ENCOUNTER — Ambulatory Visit
Admission: RE | Admit: 2013-11-11 | Discharge: 2013-11-11 | Disposition: A | Discharge status: Home / Self Care | Payer: BC Managed Care – PPO | Source: Ambulatory Visit | Attending: Radiation Oncology | Admitting: Radiation Oncology

## 2013-11-14 ENCOUNTER — Ambulatory Visit: Payer: BC Managed Care – PPO

## 2013-11-14 ENCOUNTER — Ambulatory Visit
Admission: RE | Admit: 2013-11-14 | Discharge: 2013-11-14 | Disposition: A | Discharge status: Home / Self Care | Payer: BC Managed Care – PPO | Source: Ambulatory Visit | Attending: Radiation Oncology | Admitting: Radiation Oncology

## 2013-11-14 ENCOUNTER — Other Ambulatory Visit: Payer: Self-pay | Admitting: Family Medicine

## 2013-11-15 ENCOUNTER — Ambulatory Visit: Payer: BC Managed Care – PPO | Admitting: Radiation Oncology

## 2013-11-15 ENCOUNTER — Ambulatory Visit: Payer: BC Managed Care – PPO

## 2013-11-15 ENCOUNTER — Ambulatory Visit
Admission: RE | Admit: 2013-11-15 | Discharge: 2013-11-15 | Disposition: A | Discharge status: Home / Self Care | Payer: BC Managed Care – PPO | Source: Ambulatory Visit | Attending: Radiation Oncology | Admitting: Radiation Oncology

## 2013-11-15 DIAGNOSIS — C50412 Malignant neoplasm of upper-outer quadrant of left female breast: Secondary | ICD-10-CM

## 2013-11-15 NOTE — Progress Notes (Addendum)
Weekly Management Note Current Dose:48.6   Gy  Projected Dose:63  Gy   Narrative:  The patient presents for routine under treatment assessment.  CBCT/MVCT images/Port film x-rays were reviewed.  The chart was checked. Doing well. Irritation of breast not responding to radiaplex.   Physical Findings: See on tx machine. Skin is dark over left breast.   Impression:  The patient is tolerating radiation.  Plan:  Continue treatment as planned. Continue RT. Switch to biafene for skin.

## 2013-11-16 ENCOUNTER — Ambulatory Visit
Admission: RE | Admit: 2013-11-16 | Discharge: 2013-11-16 | Disposition: A | Discharge status: Home / Self Care | Payer: BC Managed Care – PPO | Source: Ambulatory Visit | Attending: Radiation Oncology | Admitting: Radiation Oncology

## 2013-11-17 ENCOUNTER — Other Ambulatory Visit: Payer: Self-pay | Admitting: Family Medicine

## 2013-11-17 ENCOUNTER — Ambulatory Visit
Admission: RE | Admit: 2013-11-17 | Discharge: 2013-11-17 | Disposition: A | Discharge status: Home / Self Care | Payer: BC Managed Care – PPO | Source: Ambulatory Visit | Attending: Radiation Oncology | Admitting: Radiation Oncology

## 2013-11-17 ENCOUNTER — Ambulatory Visit: Payer: BC Managed Care – PPO | Admitting: Physical Therapy

## 2013-11-18 ENCOUNTER — Ambulatory Visit
Admission: RE | Admit: 2013-11-18 | Discharge: 2013-11-18 | Disposition: A | Discharge status: Home / Self Care | Payer: BC Managed Care – PPO | Source: Ambulatory Visit | Attending: Radiation Oncology | Admitting: Radiation Oncology

## 2013-11-18 ENCOUNTER — Ambulatory Visit: Payer: BC Managed Care – PPO

## 2013-11-18 NOTE — Telephone Encounter (Signed)
Zolpidem requires a paper Rx. Printed and left at front desk to be picked up at pt's convenience. Thanks! --CMS

## 2013-11-21 ENCOUNTER — Ambulatory Visit
Admission: RE | Admit: 2013-11-21 | Discharge: 2013-11-21 | Disposition: A | Discharge status: Home / Self Care | Payer: BC Managed Care – PPO | Source: Ambulatory Visit | Attending: Radiation Oncology | Admitting: Radiation Oncology

## 2013-11-21 NOTE — Telephone Encounter (Signed)
Pt informed that Rx for Ambien is ready for pick up.  Derl Barrow, RN

## 2013-11-22 ENCOUNTER — Other Ambulatory Visit: Payer: Self-pay | Admitting: *Deleted

## 2013-11-22 ENCOUNTER — Ambulatory Visit
Admission: RE | Admit: 2013-11-22 | Discharge: 2013-11-22 | Disposition: A | Discharge status: Home / Self Care | Payer: BC Managed Care – PPO | Source: Ambulatory Visit | Attending: Radiation Oncology | Admitting: Radiation Oncology

## 2013-11-22 ENCOUNTER — Ambulatory Visit: Payer: BC Managed Care – PPO

## 2013-11-22 VITALS — BP 126/81 | HR 72 | Temp 97.9°F | Wt 148.9 lb

## 2013-11-22 DIAGNOSIS — C50412 Malignant neoplasm of upper-outer quadrant of left female breast: Secondary | ICD-10-CM

## 2013-11-22 MED ORDER — RADIAPLEXRX EX GEL
Freq: Once | CUTANEOUS | Status: AC
  Administered 2013-11-22: 16:00:00 via TOPICAL

## 2013-11-22 NOTE — Progress Notes (Signed)
Weekly assessment of radiation to left chest wall.Marked hyperpigmentation with dry peeling.Patient completes treatment on Thursday.Given another tube of biafine to apply twice daily.Denies fatigue.Given appointment card to schedule one month follow up.

## 2013-11-22 NOTE — Addendum Note (Signed)
Encounter addended by: Arlyss Repress, RN on: 11/22/2013  3:57 PM<BR>     Documentation filed: Orders

## 2013-11-22 NOTE — Addendum Note (Signed)
Encounter addended by: Arlyss Repress, RN on: 11/22/2013  4:01 PM<BR>     Documentation filed: Inpatient MAR

## 2013-11-22 NOTE — Progress Notes (Signed)
Weekly Management Note Current Dose:   57.6 Gy  Projected Dose: 62.8 Gy   Narrative:  The patient presents for routine under treatment assessment.  CBCT/MVCT images/Port film x-rays were reviewed.  The chart was checked. Doing well. Some peeling underarm. Sees medical oncology tomorrow..   Physical Findings: Weight: 148 lb 14.4 oz (67.541 kg). Dry desquamation with peeling.   Impression:  The patient is tolerating radiation.  Plan:  Continue treatment as planned. Start biafene. Follow up in 2 weeks. Discussed lotion with vit e.

## 2013-11-23 ENCOUNTER — Ambulatory Visit
Admission: RE | Admit: 2013-11-23 | Discharge: 2013-11-23 | Disposition: A | Discharge status: Home / Self Care | Payer: BC Managed Care – PPO | Source: Ambulatory Visit | Attending: Radiation Oncology | Admitting: Radiation Oncology

## 2013-11-23 ENCOUNTER — Ambulatory Visit: Payer: BC Managed Care – PPO

## 2013-11-23 ENCOUNTER — Other Ambulatory Visit (HOSPITAL_BASED_OUTPATIENT_CLINIC_OR_DEPARTMENT_OTHER): Payer: BC Managed Care – PPO

## 2013-11-23 ENCOUNTER — Telehealth: Payer: Self-pay | Admitting: Oncology

## 2013-11-23 ENCOUNTER — Ambulatory Visit (HOSPITAL_BASED_OUTPATIENT_CLINIC_OR_DEPARTMENT_OTHER): Payer: BC Managed Care – PPO | Admitting: Oncology

## 2013-11-23 VITALS — BP 146/88 | HR 79 | Temp 98.1°F | Resp 18 | Ht 67.0 in | Wt 148.1 lb

## 2013-11-23 DIAGNOSIS — C50412 Malignant neoplasm of upper-outer quadrant of left female breast: Secondary | ICD-10-CM

## 2013-11-23 DIAGNOSIS — G609 Hereditary and idiopathic neuropathy, unspecified: Secondary | ICD-10-CM

## 2013-11-23 DIAGNOSIS — Z171 Estrogen receptor negative status [ER-]: Secondary | ICD-10-CM

## 2013-11-23 DIAGNOSIS — C50419 Malignant neoplasm of upper-outer quadrant of unspecified female breast: Secondary | ICD-10-CM

## 2013-11-23 DIAGNOSIS — D702 Other drug-induced agranulocytosis: Secondary | ICD-10-CM

## 2013-11-23 LAB — CBC WITH DIFFERENTIAL/PLATELET
BASO%: 0.3 % (ref 0.0–2.0)
Basophils Absolute: 0 10*3/uL (ref 0.0–0.1)
EOS ABS: 0 10*3/uL (ref 0.0–0.5)
EOS%: 0.7 % (ref 0.0–7.0)
HCT: 36.9 % (ref 34.8–46.6)
HGB: 12.2 g/dL (ref 11.6–15.9)
LYMPH%: 26.6 % (ref 14.0–49.7)
MCH: 28.4 pg (ref 25.1–34.0)
MCHC: 33.1 g/dL (ref 31.5–36.0)
MCV: 85.8 fL (ref 79.5–101.0)
MONO#: 0.3 10*3/uL (ref 0.1–0.9)
MONO%: 8.2 % (ref 0.0–14.0)
NEUT#: 2 10*3/uL (ref 1.5–6.5)
NEUT%: 64.2 % (ref 38.4–76.8)
NRBC: 0 % (ref 0–0)
Platelets: 257 10*3/uL (ref 145–400)
RBC: 4.3 10*6/uL (ref 3.70–5.45)
RDW: 13.5 % (ref 11.2–14.5)
WBC: 3 10*3/uL — AB (ref 3.9–10.3)
lymph#: 0.8 10*3/uL — ABNORMAL LOW (ref 0.9–3.3)

## 2013-11-23 LAB — COMPREHENSIVE METABOLIC PANEL (CC13)
ALBUMIN: 3.6 g/dL (ref 3.5–5.0)
ALT: 33 U/L (ref 0–55)
AST: 25 U/L (ref 5–34)
Alkaline Phosphatase: 133 U/L (ref 40–150)
Anion Gap: 9 mEq/L (ref 3–11)
BUN: 10.8 mg/dL (ref 7.0–26.0)
CHLORIDE: 107 meq/L (ref 98–109)
CO2: 26 mEq/L (ref 22–29)
Calcium: 10 mg/dL (ref 8.4–10.4)
Creatinine: 0.8 mg/dL (ref 0.6–1.1)
Glucose: 101 mg/dl (ref 70–140)
POTASSIUM: 3.8 meq/L (ref 3.5–5.1)
Sodium: 143 mEq/L (ref 136–145)
TOTAL PROTEIN: 7 g/dL (ref 6.4–8.3)
Total Bilirubin: 0.29 mg/dL (ref 0.20–1.20)

## 2013-11-23 NOTE — Progress Notes (Signed)
ID: Ana Young OB: 04/27/1961  MR#: [id number]  CSN#:[id number]  PCP: Ana See, Young GYN:   SU: Ana Young OTHER Young: Ana Young, Ana Young  CHIEF COMPLAINT:  Left  Breast Cancer/Neoadjuvant Chemotherapy  BREAST CANCER HISTORY:  Ana Young herself palpated a mass in her left breast 01/15/2013 and brought it to the attention of Ana Young, who arranged for her to have bilateral digital mammography at the breast Center 01/27/2013. The patient does have heterogeneously dense breasts. In the outer upper left breast there was an oval mass by mammography measuring approximately 1.5 cm, which by palpation was firm and measured approximately 1 cm. Ultrasound showed this to be hypoechoic and to measure 1.4 cm.  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  The patient then underwent bilateral breast MRI 02/28/2013. In the left breast upper-outer quadrant the mass in question measured 2.9 cm. There was a second mass lateral to it measuring 1.0 cm. There were no additional abnormal appearing lymph nodes and no evidence of internal mammary adenopathy.  In the right breast there were 2 areas of irregular enhancement in the superior central third of the breast, both measuring 9 mm. These were felt to be worrisome for a ductal carcinoma in situ.  The patient's case was discussed at the multidisciplinary breast cancer conference 03/02/2013. Her subsequent history is as detailed below.  INTERVAL HISTORY: Ana Young returns today accompanied by her husband Ana Young for followup of her locally advanced left breast carcinoma. She has been receiving radiation treatments and tomorrow will be her final day. She is thinking she may be able to get back to work in another 2-4 weeks. She is just beginning to do a little bit of walking for exercise  ROS:  As far as the  radiation goes she has had hyperpigmentation and now some peeling. She denies significant fatigue. There is a little bit of soreness. She is using the creams appropriately. Hot flashes can be "bad" and they do cause her to sweat at times. She is sleeping fine. Peripheral neuropathy is minimal in the hands and a little bit more in the right foot, where it feels to her like there is something under the toe. She doesn't have that sensation in the left foot. In the morning or after sitting for a while she feels stiff and that improved with activity. Aside from these issues a detailed review of systems today was noncontributory   PAST MEDICAL HISTORY: Past Medical History  Diagnosis Date  . Depression   . Vitamin D deficiency   . Anxiety   . Seasonal allergies   . HTN (hypertension)   . Insomnia   . History of migraines   . Cancer     Left breast cancer  . Breast cancer   . Anemia 06/07/2013  . Drug induced neutropenia(288.03) 08/02/2013    PAST SURGICAL HISTORY: Past Surgical History  Procedure Laterality Date  . Bunionectomy Left 2009  . Portacath placement Left 03/09/2013    Procedure: INSERTION PORT-A-CATH;  Surgeon: Ana [facility];  Location: [facility];  Service: General;  Laterality: Left;  . Partial mastectomy with needle localization and axillary sentinel lymph node bx Left 08/16/2013    Procedure: PARTIAL MASTECTOMY WITH NEEDLE LOCALIZATION AND AXILLARY SENTINEL LYMPH NODE BIOPSY AND PORT A CATH REMOVAL;  Surgeon: Ana Young;  Location: [facility];  Service: General;  Laterality: Left;    FAMILY HISTORY Family History  Problem Relation Age of Onset  . Heart attack Brother 70  . Congestive Heart Failure Mother   . Stroke Father   . Diabetes Father   . Breast cancer Maternal Grandmother 95   the patient's parents are in their 59s. The patient has one brother, no sisters. The patient's maternal grandmother was diagnosed with breast cancer the  age of 13. There is no other history of breast cancer in the family to the patient's knowledge, and no history of ovarian cancer  GYNECOLOGIC HISTORY:  Menarche age 22, first live birth age 89, the patient is GX P2. Her periods are irregular, but still every 2-3 months, and otherwise unremarkable.  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  She is currently not working. Her husband Ana Young works as a Nurse, learning disability. Son Dory Larsen lives in [place] and works in Scientist, research (medical). Daughter Jamee Keach is 36 year old and at home. The patient has no grandchildren. She attends the [employer]    ADVANCED DIRECTIVES: Not in place  HEALTH MAINTENANCE:  (Updated January 2015) History  Substance Use Topics  . Smoking status: Never Smoker   . Smokeless tobacco: Never Used  . Alcohol Use: No     Colonoscopy: - Never  PAP: Feb 2012, Dr. Buelah Manis  Bone density: - Never  Lipid panel:  Dec 2012, Dr. Zigmund Daniel   No Known Allergies  Current Outpatient Prescriptions  Medication Sig Dispense Refill  . ALPRAZolam (XANAX) 1 MG tablet Take 1 mg by mouth 2 (two) times daily as needed for sleep (1/2 tablet bid as needed).      Marland Kitchen buPROPion (WELLBUTRIN XL) 300 MG 24 hr tablet TAKE 1 TABLET (300 MG TOTAL) BY MOUTH DAILY.  30 tablet  3  . docusate sodium (COLACE) 100 MG capsule Take 1-2 capsules (100-200 mg total) by mouth every morning. As directed for constipation  30 capsule  1  . ibuprofen (ADVIL,MOTRIN) 800 MG tablet TAKE 1 TABLET (800 MG TOTAL) BY MOUTH EVERY 8 (EIGHT) HOURS AS NEEDED FOR PAIN.  30 tablet  2  . LORazepam (ATIVAN) 0.5 MG tablet Take 1 tablet (0.5 mg total) by mouth at bedtime as needed for anxiety (Nausea or vomiting).  30 tablet  0  . non-metallic deodorant (ALRA) MISC Apply 1 application topically daily as needed.      . polyethylene glycol powder (MIRALAX) powder  Take 17 g by mouth daily as needed (constipation).  255 g  1  . propranolol ER (INDERAL LA) 120 MG 24 hr capsule Take 1 capsule (120 mg total) by mouth daily.  93 capsule  1  . Wound Cleansers (RADIAPLEX EX) Apply topically.      Marland Kitchen zolpidem (AMBIEN) 5 MG tablet Take 1 tablet (5 mg total) by mouth at bedtime as needed for sleep.  30 tablet  2   No current facility-administered medications for this visit.   Facility-Administered Medications Ordered in Other Visits  Medication Dose Route Frequency Provider Last Rate Last Dose  . sodium chloride 0.9 % injection 10 mL  10 mL Intracatheter PRN Chauncey Cruel, Young   10 mL at 06/21/13 1322     Middle-aged African American woman who appears stated age 68 Vitals:   11/23/13 1132  BP: 146/88  Pulse: 79  Temp: 98.1 F (36.7 C)  Resp: 18  Body mass index is 23.19 kg/(m^2).    ECOG FS: 1 Filed Weights   11/23/13 1132  Weight: 148 lb 1.6 oz (67.178 kg)   Sclerae unicteric, EOMs intact Oropharynx clear and moist No cervical or supraclavicular adenopathy Lungs no rales or rhonchi Heart regular rate and rhythm Abd soft, nontender, positive bowel sounds MSK no focal spinal tenderness, no upper extremity lymphedema Neuro: nonfocal, well oriented, positive affect Breasts: The right breast is unremarkable. The left breast is currently receiving radiation. There is deep hyperpigmentation over the radiation port. There is minimal dry desquamation. There is no significant tenderness and there are no masses palpated. The left axilla is benign  LAB RESULTS:  Lab Results  Component Value Date   WBC 4.0 09/09/2013   NEUTROABS 2.4 09/09/2013   HGB 11.7 09/09/2013   HCT 36.0 09/09/2013   MCV 93.1 09/09/2013   PLT 260 09/09/2013      Chemistry      Component Value Date/Time   NA 141 09/09/2013 1041   NA 138 06/16/2011 0938   K 4.2 09/09/2013 1041   K 4.1 06/16/2011 0938   CL 110 06/16/2011 0938   CO2 29 09/09/2013 1041   CO2 26 06/16/2011 0938    BUN 15.3 09/09/2013 1041   BUN 10 06/16/2011 0938   CREATININE 0.8 09/09/2013 1041   CREATININE 0.81 06/16/2011 0938   CREATININE 0.74 07/26/2010 2332      Component Value Date/Time   CALCIUM 10.1 09/09/2013 1041   CALCIUM 9.5 06/16/2011 0938   ALKPHOS 117 09/09/2013 1041   ALKPHOS 78 06/16/2011 0938   AST 15 09/09/2013 1041   AST 13 06/16/2011 0938   ALT 18 09/09/2013 1041   ALT 10 06/16/2011 0938   BILITOT <0.20 09/09/2013 1041   BILITOT 0.5 06/16/2011 0938      STUDIES: No results found.  ASSESSMENT: 53 y.o. 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    (2) MRI on 03/02/2013 showed an additional nodule at the 2:00 position, 5 mm lateral to the primary in the left breast, measuring 10 x 6 x 9 mm. This was biopsied on 03/11/2013 and was consistent with high-grade carcinoma [phone]). Prognostic panel is pending.   (3) MRI on 03/02/2013 also showed 2 areas of irregularity in the right breast which were worrisome for possible DCIS. Both of these areas were biopsied on 03/04/2013, and both showed fibrocystic changes with no evidence of malignancy ([id number]).   (4) genetics testing declined by patient after consultation with genetics counselor February 2015  (5)  treated in the neoadjuvant setting with   (a) 4 dose dense cycles of doxorubicin/ cyclophosphamide, with the first dose given on 03/15/2013 and the final one 04/26/2013, with a complete radiologic response  (b) followed by 10 weekly doses of carboplatin/paclitaxel started 05/17/2013, completed 07/19/2013, final two doses omitted because of neuropathy and cytopenias   (6) left lumpectomy and sentinel lymph node sampling 08/16/2013 shows a complete pathologic response.  (7) radiation therapy to be completed 11/24/2013  PLAN: Norlene we'll complete her radiation treatments  tomorrow. She is then going to be ready to start long-term followup and that is what we discussed today.  Of course the effect of chemotherapy and radiation linger beyond the last dose. Radiation in particular can cause fatigue up to 2 and 3 months after it is completed.  The best thing to do for that his activity and she is going to be participating in the [facility] program. I went ahead and wrote her a release today.  She is having some postchemotherapy symptoms including a little bit of joint stiffness some mouth dryness, minimal taste alteration and, and so on. These will fade with time but it will take about a year before the have completely resolved. She has minimal peripheral neuropathy and hopefully that will continue to improve. She is exhibiting no symptoms of posttraumatic stress, or other adjustment problems, I think she would benefit greatly from participating in finding your new normal and I gave her the pamphlet today.  Otherwise she is a ready scheduled to Young Dr. Pablo Ledger in June. She will Young [place] again in July and then we will start seeing her on an every 3 month basis alternating with her surgeon for the next year or 2.  Thayer Headings since we are not going to be doing scans or other special studies in the absence of specific symptoms to evaluate.  Neoma Laming has a good understanding of the overall plan. She agrees with it. She does a goal of treatment in her case is cure. She will call with any problems that may develop before her next visit here. Chauncey Cruel, Young   11/23/2013

## 2013-11-23 NOTE — Telephone Encounter (Signed)
per pof to sch pt appt/print pt sch

## 2013-11-24 ENCOUNTER — Ambulatory Visit
Admission: RE | Admit: 2013-11-24 | Discharge: 2013-11-24 | Disposition: A | Discharge status: Home / Self Care | Payer: BC Managed Care – PPO | Source: Ambulatory Visit | Attending: Radiation Oncology | Admitting: Radiation Oncology

## 2013-11-24 ENCOUNTER — Encounter: Payer: Self-pay | Admitting: Radiation Oncology

## 2013-11-28 NOTE — Progress Notes (Addendum)
  Radiation Oncology         (336) [phone] ________________________________  Name: Madysyn Hanken MRN: [id number]  Date: 11/24/2013  DOB: 29-Mar-1961  End of Treatment Note  Diagnosis:   T1cN0 triple negative left breast cancer     Indication for treatment:  Curative       Radiation treatment dates:   09/29/2013-11/24/2013  Site/dose:   Left breast/ 46 Gy at 1.8 Gy per fraction x 26 fractions.  Left breast boost/ 14.4 Gy at 1.8 Gy per fraction x 8 fractions  Beams/energy:  Opposed tangents with reduced fields / 6 MV photons Enface electrons / 15 MV photons  Narrative: The patient tolerated radiation treatment relatively well.   She had som difficulty with set up initially and had to be resimulated. She was able to continue treatment after that. She had some minimal peeling in her axilla and hyperpigmentation over the left breast.   Plan: The patient has completed radiation treatment. The patient will return to radiation oncology clinic for routine followup in one month. I advised them to call or return sooner if they have any questions or concerns related to their recovery or treatment. She also has follow up scheduled with medical oncology.   ------------------------------------------------  Thea Silversmith, MD

## 2013-11-30 NOTE — Addendum Note (Signed)
Encounter addended by: Thea Silversmith, MD on: 11/30/2013  8:34 AM<BR>     Documentation filed: Notes Section

## 2013-11-30 NOTE — Progress Notes (Signed)
Name: Sidrah Harden   MRN: [id number]  Date:  10/28/13   DOB: 15-Jan-1961  Status:outpatient    DIAGNOSIS: Breast cancer.  CONSENT VERIFIED: yes   SET UP: Patient is setup supine   IMMOBILIZATION:  The following immobilization was used:Custom Moldable Pillow, breast board.   NARRATIVE: Cory Rama underwent complex simulation and treatment planning for her boost treatment today.  Her tumor volume was outlined on the planning CT scan. The depth of her cavity was felt to be appropriate for treatment with electrons    15  MeV electrons will be prescribed to the 100% isodose line.   I personally oversaw and approved the construction of a unique block which will be used for beam modification purposes.  A special port plan is requested.

## 2013-12-05 ENCOUNTER — Ambulatory Visit: Payer: BC Managed Care – PPO | Attending: General Surgery

## 2013-12-05 DIAGNOSIS — IMO0001 Reserved for inherently not codable concepts without codable children: Secondary | ICD-10-CM | POA: Insufficient documentation

## 2013-12-05 DIAGNOSIS — M24519 Contracture, unspecified shoulder: Secondary | ICD-10-CM | POA: Insufficient documentation

## 2013-12-29 ENCOUNTER — Ambulatory Visit
Admission: RE | Admit: 2013-12-29 | Discharge: 2013-12-29 | Disposition: A | Discharge status: Home / Self Care | Payer: BC Managed Care – PPO | Source: Ambulatory Visit | Attending: Radiation Oncology | Admitting: Radiation Oncology

## 2013-12-29 VITALS — BP 135/77 | HR 78 | Temp 97.8°F | Wt 151.5 lb

## 2013-12-29 DIAGNOSIS — C50412 Malignant neoplasm of upper-outer quadrant of left female breast: Secondary | ICD-10-CM

## 2013-12-30 NOTE — Progress Notes (Addendum)
   Department of Radiation Oncology  Phone:  [phone] Fax:        [phone]   Name: Ana Young MRN: [id number]  DOB: 03-06-1961  Date: 12/29/2013  Follow Up Visit Note  Diagnosis: T1cN0 triple negative left breast cancer  Summary and Interval since last radiation: 2/p 60.4 Gy completed 11/24/13  Interval History: Ana Young presents today for routine followup.  She is doing well. Her skin looks good and she has started exercising.  She is not taking antiestrogen.   Allergies: No Known Allergies  Medications:  Current Outpatient Prescriptions  Medication Sig Dispense Refill  . ALPRAZolam (XANAX) 1 MG tablet Take 1 mg by mouth 2 (two) times daily as needed for sleep (1/2 tablet bid as needed).      Marland Kitchen buPROPion (WELLBUTRIN XL) 300 MG 24 hr tablet TAKE 1 TABLET (300 MG TOTAL) BY MOUTH DAILY.  30 tablet  3  . ibuprofen (ADVIL,MOTRIN) 800 MG tablet TAKE 1 TABLET (800 MG TOTAL) BY MOUTH EVERY 8 (EIGHT) HOURS AS NEEDED FOR PAIN.  30 tablet  2  . propranolol ER (INDERAL LA) 120 MG 24 hr capsule Take 1 capsule (120 mg total) by mouth daily.  93 capsule  1  . Wound Cleansers (RADIAPLEX EX) Apply topically.      Marland Kitchen zolpidem (AMBIEN) 5 MG tablet Take 1 tablet (5 mg total) by mouth at bedtime as needed for sleep.  30 tablet  2  . docusate sodium (COLACE) 100 MG capsule Take 1-2 capsules (100-200 mg total) by mouth every morning. As directed for constipation  30 capsule  1  . LORazepam (ATIVAN) 0.5 MG tablet Take 1 tablet (0.5 mg total) by mouth at bedtime as needed for anxiety (Nausea or vomiting).  30 tablet  0  . polyethylene glycol powder (MIRALAX) powder Take 17 g by mouth daily as needed (constipation).  255 g  1   No current facility-administered medications for this encounter.   Facility-Administered Medications Ordered in Other Encounters  Medication Dose Route Frequency Provider Last Rate Last Dose  . sodium chloride 0.9 % injection 10 mL  10 mL Intracatheter PRN Chauncey Cruel,  MD   10 mL at 06/21/13 1322    Physical Exam:  Filed Vitals:   12/29/13 0838  BP: 135/77  Pulse: 78  Temp: 97.8 F (36.6 C)  Weight: 151 lb 8 oz (68.72 kg)   Slight hyperpigmentation of the chest wall. Worse laterally.   IMPRESSION: Ana Young is a 53 y.o. female triple negative left breast cancer s/p radiation with resolving acute effects of treatment  PLAN:  She is doing well. We discussed yearly mammograms and the importance of sun protection. I discussed FYNN with her.  I asked her to switch over to lotion with Vitamin E. She has follow up with surgery and medical oncology. I' happy to see her back on a PRN basis.     Thea Silversmith, MD

## 2014-01-08 ENCOUNTER — Other Ambulatory Visit: Payer: Self-pay | Admitting: Family Medicine

## 2014-01-31 ENCOUNTER — Other Ambulatory Visit (HOSPITAL_BASED_OUTPATIENT_CLINIC_OR_DEPARTMENT_OTHER): Payer: BC Managed Care – PPO

## 2014-01-31 ENCOUNTER — Ambulatory Visit (HOSPITAL_BASED_OUTPATIENT_CLINIC_OR_DEPARTMENT_OTHER): Payer: BC Managed Care – PPO | Admitting: Oncology

## 2014-01-31 ENCOUNTER — Telehealth: Payer: Self-pay | Admitting: Oncology

## 2014-01-31 VITALS — BP 140/85 | HR 68 | Temp 98.3°F | Resp 18 | Ht 67.0 in | Wt 152.7 lb

## 2014-01-31 DIAGNOSIS — C50412 Malignant neoplasm of upper-outer quadrant of left female breast: Secondary | ICD-10-CM

## 2014-01-31 DIAGNOSIS — D702 Other drug-induced agranulocytosis: Secondary | ICD-10-CM

## 2014-01-31 DIAGNOSIS — C50419 Malignant neoplasm of upper-outer quadrant of unspecified female breast: Secondary | ICD-10-CM

## 2014-01-31 DIAGNOSIS — Z171 Estrogen receptor negative status [ER-]: Secondary | ICD-10-CM

## 2014-01-31 LAB — COMPREHENSIVE METABOLIC PANEL (CC13)
ALT: 19 U/L (ref 0–55)
AST: 18 U/L (ref 5–34)
Albumin: 3.6 g/dL (ref 3.5–5.0)
Alkaline Phosphatase: 153 U/L — ABNORMAL HIGH (ref 40–150)
Anion Gap: 7 mEq/L (ref 3–11)
BILIRUBIN TOTAL: 0.25 mg/dL (ref 0.20–1.20)
BUN: 11.3 mg/dL (ref 7.0–26.0)
CALCIUM: 10.1 mg/dL (ref 8.4–10.4)
CHLORIDE: 106 meq/L (ref 98–109)
CO2: 28 mEq/L (ref 22–29)
Creatinine: 0.8 mg/dL (ref 0.6–1.1)
Glucose: 104 mg/dl (ref 70–140)
Potassium: 3.9 mEq/L (ref 3.5–5.1)
Sodium: 141 mEq/L (ref 136–145)
Total Protein: 7 g/dL (ref 6.4–8.3)

## 2014-01-31 LAB — CBC WITH DIFFERENTIAL/PLATELET
BASO%: 1 % (ref 0.0–2.0)
Basophils Absolute: 0 10*3/uL (ref 0.0–0.1)
EOS%: 1.6 % (ref 0.0–7.0)
Eosinophils Absolute: 0.1 10*3/uL (ref 0.0–0.5)
HEMATOCRIT: 39.4 % (ref 34.8–46.6)
HGB: 12.6 g/dL (ref 11.6–15.9)
LYMPH#: 1 10*3/uL (ref 0.9–3.3)
LYMPH%: 26.9 % (ref 14.0–49.7)
MCH: 27.7 pg (ref 25.1–34.0)
MCHC: 31.9 g/dL (ref 31.5–36.0)
MCV: 86.7 fL (ref 79.5–101.0)
MONO#: 0.4 10*3/uL (ref 0.1–0.9)
MONO%: 10.2 % (ref 0.0–14.0)
NEUT#: 2.2 10*3/uL (ref 1.5–6.5)
NEUT%: 60.3 % (ref 38.4–76.8)
Platelets: 263 10*3/uL (ref 145–400)
RBC: 4.54 10*6/uL (ref 3.70–5.45)
RDW: 14.3 % (ref 11.2–14.5)
WBC: 3.7 10*3/uL — ABNORMAL LOW (ref 3.9–10.3)

## 2014-01-31 NOTE — Progress Notes (Signed)
ID: [name] OB: Sep 22, 1960  MR#: [id number]  CSN#:[id number]  PCP: Christa See, MD GYN:   SU: Stark Klein OTHER MD: Thea Silversmith, Dalbert Mayotte  CHIEF COMPLAINT:  Left  Breast Cancer CURRENT TREATMENT: Observation  BREAST CANCER HISTORY:  Buford herself palpated a mass in her left breast 01/15/2013 and brought it to the attention of Dr. Lindell Noe, who arranged for her to have bilateral digital mammography at the [facility] Center 01/27/2013. The patient does have heterogeneously dense breasts. In the outer upper left breast there was an oval mass by mammography measuring approximately 1.5 cm, which by palpation was firm and measured approximately 1 cm. Ultrasound showed this to be hypoechoic and to measure 1.4 cm.  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  The patient then underwent bilateral breast MRI 02/28/2013. In the left breast upper-outer quadrant the mass in question measured 2.9 cm. There was a second mass lateral to it measuring 1.0 cm. There were no additional abnormal appearing lymph nodes and no evidence of internal mammary adenopathy.  In the right breast there were 2 areas of irregular enhancement in the superior central third of the breast, both measuring 9 mm. These were felt to be worrisome for a ductal carcinoma in situ.  The patient's case was discussed at the multidisciplinary breast cancer conference 03/02/2013. Her subsequent history is as detailed below.  INTERVAL HISTORY: Shaelin returns today accompanied by her husband Randy+Jaylene for followup of Icess's locally advanced left breast carcinoma. She has completed all her treatments and is now initiating followup. 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  ROS:  Sometimes she has a little bit of discomfort associated with the port site. Sometimes she has a little bit of soreness in her left axilla. She has a little bit of trouble reading, and has not yet had her eyes rechecked. Otherwise a detailed review of systems today was entirely negative. She is exercising regularly at the [facility] and the police department gym and [facility] [employer]  PAST MEDICAL HISTORY: Past Medical History  Diagnosis Date  . Depression   . Vitamin D deficiency   . Anxiety   . Seasonal allergies   . HTN (hypertension)   . Insomnia   . History of migraines   . Cancer     Left breast cancer  . Breast cancer   . Anemia 06/07/2013  . Drug induced neutropenia(288.03) 08/02/2013  . Radiation 09/29/13-11/24/13    Left Breast triple negative 60.4 Gy    PAST SURGICAL HISTORY: Past Surgical History  Procedure Laterality Date  . Bunionectomy Left 2009  . Portacath placement Left 03/09/2013    Procedure: INSERTION PORT-A-CATH;  Surgeon: Stark Klein, MD;  Location: [place];  Service: General;  Laterality: Left;  . Partial mastectomy with needle localization and axillary sentinel lymph node bx Left 08/16/2013    Procedure: PARTIAL MASTECTOMY WITH NEEDLE LOCALIZATION AND AXILLARY SENTINEL LYMPH NODE BIOPSY AND PORT A CATH REMOVAL;  Surgeon: Stark Klein, MD;  Location: [place];  Service: General;  Laterality: Left;    FAMILY HISTORY Family History  Problem Relation Age of Onset  . Heart attack Brother 39  . Congestive Heart Failure Mother   .  Stroke Father   . Diabetes Father   . Breast cancer Maternal Grandmother 95   the patient's parents are in their 4s. The patient has one brother, no sisters. The patient's maternal grandmother was diagnosed with breast cancer the age of 26. There is no other history of breast cancer in the family to the patient's knowledge, and no history of ovarian  cancer  GYNECOLOGIC HISTORY:  Menarche age 61, first live birth age 4, the patient is GX P2. Her periods are irregular, but still every 2-3 months, and otherwise unremarkable.  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 Son Dory Larsen lives in [place] and works in Scientist, research (medical). Daughter Elsye Mccollister is 23 year old and at home. The patient has no grandchildren. She attends the [employer] church    ADVANCED DIRECTIVES: Not in place  HEALTH MAINTENANCE:  (Updated January 2015) History  Substance Use Topics  . Smoking status: Never Smoker   . Smokeless tobacco: Never Used  . Alcohol Use: No     Colonoscopy: - Never  PAP: Feb 2012, Dr. Buelah Manis  Bone density: - Never  Lipid panel:  Dec 2012, Dr. Zigmund Daniel   No Known Allergies  Current Outpatient Prescriptions  Medication Sig Dispense Refill  . ALPRAZolam (XANAX) 1 MG tablet Take 1 mg by mouth 2 (two) times daily as needed for sleep (1/2 tablet bid as needed).      Marland Kitchen buPROPion (WELLBUTRIN XL) 300 MG 24 hr tablet TAKE 1 TABLET (300 MG TOTAL) BY MOUTH DAILY.  30 tablet  3  . docusate sodium (COLACE) 100 MG capsule Take 1-2 capsules (100-200 mg total) by mouth every morning. As directed for constipation  30 capsule  1  . ibuprofen (ADVIL,MOTRIN) 800 MG tablet TAKE 1 TABLET (800 MG TOTAL) BY MOUTH EVERY 8 (EIGHT) HOURS AS NEEDED FOR PAIN.  30 tablet  2  . LORazepam (ATIVAN) 0.5 MG tablet Take 1 tablet (0.5 mg total) by mouth at bedtime as needed for anxiety (Nausea or vomiting).  30 tablet  0  . polyethylene glycol powder (MIRALAX) powder Take 17 g by mouth daily as needed (constipation).  255 g  1  . propranolol ER (INDERAL LA) 120 MG 24 hr capsule TAKE 1 CAPSULE (120 MG TOTAL) BY MOUTH DAILY.  93 capsule  1  . Wound Cleansers (RADIAPLEX EX) Apply topically.      Marland Kitchen zolpidem  (AMBIEN) 5 MG tablet Take 1 tablet (5 mg total) by mouth at bedtime as needed for sleep.  30 tablet  2   No current facility-administered medications for this visit.   Facility-Administered Medications Ordered in Other Visits  Medication Dose Route Frequency Provider Last Rate Last Dose  . sodium chloride 0.9 % injection 10 mL  10 mL Intracatheter PRN Chauncey Cruel, MD   10 mL at 06/21/13 1322     Middle-aged African American woman in no acute distress Filed Vitals:   01/31/14 1350  BP: 140/85  Pulse: 68  Temp: 98.3 F (36.8 C)  Resp: 18       Body mass index is 23.91 kg/(m^2).    ECOG FS: 0 Filed Weights   01/31/14 1350  Weight: 152 lb 11.2 oz (69.264 kg)   Sclerae unicteric, pupils round and equal Oropharynx clear and moist No cervical or supraclavicular adenopathy Lungs no rales or rhonchi Heart regular rate and  rhythm Abd soft, nontender, positive bowel sounds MSK no focal spinal tenderness, no upper extremity lymphedema Neuro: nonfocal, well oriented, positive affect Breasts: The right breast is unremarkable. The left breast is status post lumpectomy and radiation. There is no evidence of local recurrence. The left axilla is benign  LAB RESULTS:  Lab Results  Component Value Date   WBC 3.7* 01/31/2014   NEUTROABS 2.2 01/31/2014   HGB 12.6 01/31/2014   HCT 39.4 01/31/2014   MCV 86.7 01/31/2014   PLT 263 01/31/2014      Chemistry      Component Value Date/Time   NA 143 11/23/2013 1119   NA 138 06/16/2011 0938   K 3.8 11/23/2013 1119   K 4.1 06/16/2011 0938   CL 110 06/16/2011 0938   CO2 26 11/23/2013 1119   CO2 26 06/16/2011 0938   BUN 10.8 11/23/2013 1119   BUN 10 06/16/2011 0938   CREATININE 0.8 11/23/2013 1119   CREATININE 0.81 06/16/2011 0938   CREATININE 0.74 07/26/2010 2332      Component Value Date/Time   CALCIUM 10.0 11/23/2013 1119   CALCIUM 9.5 06/16/2011 0938   ALKPHOS 133 11/23/2013 1119   ALKPHOS 78 06/16/2011 0938   AST 25 11/23/2013 1119    AST 13 06/16/2011 0938   ALT 33 11/23/2013 1119   ALT 10 06/16/2011 0938   BILITOT 0.29 11/23/2013 1119   BILITOT 0.5 06/16/2011 0938      STUDIES: No results found.  Repeat mammography will be due late August or early September  ASSESSMENT: 53 y.o. 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    (2) MRI on 03/02/2013 showed an additional nodule at the 2:00 position, 5 mm lateral to the primary in the left breast, measuring 10 x 6 x 9 mm. This was biopsied on 03/11/2013 and was consistent with high-grade carcinoma [phone]). Prognostic panel is pending.   (3) MRI on 03/02/2013 also showed 2 areas of irregularity in the right breast which were worrisome for possible DCIS. Both of these areas were biopsied on 03/04/2013, and both showed fibrocystic changes with no evidence of malignancy ([id number]).   (4) genetics testing declined by patient after consultation with genetics counselor February 2015  (5)  treated in the neoadjuvant setting with   (a) 4 dose dense cycles of doxorubicin/ cyclophosphamide, with the first dose given on 03/15/2013 and the final one 04/26/2013, with a complete radiologic response  (b) followed by 10 weekly doses of carboplatin/paclitaxel started 05/17/2013, completed 07/19/2013, final two doses omitted because of neuropathy and cytopenias   (6) left lumpectomy and sentinel lymph node sampling 08/16/2013 showed a complete pathologic response.  (7) radiation therapy completed 11/22/2013  PLAN: Latoiya has recovered very nicely from her intensive therapy. She is now ready to enter the "normal" world. We are going to observe her for the next 5 years. If she sees Dr. Barry Dienes in October, she will see me next in January. We will continue to "tacking her" every 3 months at least for the first year or 2, and then it will  be every 6 months until the completion of the 5 years of followup.  Neoma Laming has a good understanding of the overall plan. She agrees with it. She does the goal of treatment in her case is cure. She will call with any problems that may develop  before her next visit here. Chauncey Cruel, MD   01/31/2014

## 2014-01-31 NOTE — Telephone Encounter (Signed)
per pof to sch pt appt-sch and gave pt copy of sch °

## 2014-02-16 ENCOUNTER — Encounter: Payer: Self-pay | Admitting: Family Medicine

## 2014-02-16 ENCOUNTER — Telehealth: Payer: Self-pay | Admitting: Family Medicine

## 2014-02-16 NOTE — Progress Notes (Unsigned)
Placed form in MD box. Filip Luten CMA

## 2014-02-16 NOTE — Telephone Encounter (Signed)
Completed work form for Mrs. Ana Young and left with Candiss Norse, RN. Form to be scanned, then faxed to [phone], and original to be picked up by patient. Thanks! --CMS

## 2014-02-16 NOTE — Progress Notes (Unsigned)
Pt dropped off paperwork to be filled out regarding the release of psychological and medical treatment information for employment.

## 2014-02-16 NOTE — Telephone Encounter (Signed)
Pt informed that form is completed and faxed to number provided.  Form copied for scanning in pt's record.  Pt to pick up original copy.  Derl Barrow, RN

## 2014-03-07 ENCOUNTER — Ambulatory Visit (INDEPENDENT_AMBULATORY_CARE_PROVIDER_SITE_OTHER): Payer: BC Managed Care – PPO | Admitting: General Surgery

## 2014-03-15 ENCOUNTER — Other Ambulatory Visit: Payer: Self-pay | Admitting: Family Medicine

## 2014-03-21 ENCOUNTER — Other Ambulatory Visit: Payer: Self-pay | Admitting: Family Medicine

## 2014-03-22 NOTE — Telephone Encounter (Signed)
Red Team: Please call pt to let her know Rx for Ambien is ready for her to pick up at any time at the front desk. Thanks.  Christina Six: Please disregard routed message / Rx. Not sure why this was sent to you. Sorry. --CMS

## 2014-03-22 NOTE — Telephone Encounter (Signed)
Pt informed that rx is ready to be picked up at the front. Kohler Pellerito CMA

## 2014-04-17 ENCOUNTER — Other Ambulatory Visit (INDEPENDENT_AMBULATORY_CARE_PROVIDER_SITE_OTHER): Payer: Self-pay | Admitting: General Surgery

## 2014-04-17 ENCOUNTER — Ambulatory Visit (INDEPENDENT_AMBULATORY_CARE_PROVIDER_SITE_OTHER): Payer: BC Managed Care – PPO | Admitting: General Surgery

## 2014-04-17 DIAGNOSIS — C50412 Malignant neoplasm of upper-outer quadrant of left female breast: Secondary | ICD-10-CM

## 2014-04-24 ENCOUNTER — Other Ambulatory Visit: Payer: Self-pay | Admitting: *Deleted

## 2014-04-24 MED ORDER — IBUPROFEN 800 MG PO TABS
800.0000 mg | ORAL_TABLET | Freq: Three times a day (TID) | ORAL | Status: DC | PRN
Start: 2014-04-24 — End: 2014-07-12

## 2014-04-26 ENCOUNTER — Ambulatory Visit
Admission: RE | Admit: 2014-04-26 | Discharge: 2014-04-26 | Disposition: A | Discharge status: Home / Self Care | Payer: BC Managed Care – PPO | Source: Ambulatory Visit | Attending: General Surgery | Admitting: General Surgery

## 2014-04-26 DIAGNOSIS — C50412 Malignant neoplasm of upper-outer quadrant of left female breast: Secondary | ICD-10-CM

## 2014-07-05 ENCOUNTER — Telehealth: Payer: Self-pay | Admitting: Oncology

## 2014-07-05 NOTE — Telephone Encounter (Signed)
S/w pt advised appt chg from 2/1 (md pal) to 2/17 @ 1.15 with HF. Pt verbalized understanding.

## 2014-07-12 ENCOUNTER — Other Ambulatory Visit: Payer: Self-pay | Admitting: Family Medicine

## 2014-07-19 ENCOUNTER — Encounter: Payer: Self-pay | Admitting: Family Medicine

## 2014-07-19 ENCOUNTER — Telehealth: Payer: Self-pay | Admitting: Family Medicine

## 2014-07-19 ENCOUNTER — Other Ambulatory Visit: Payer: Self-pay | Admitting: Family Medicine

## 2014-07-19 ENCOUNTER — Ambulatory Visit (INDEPENDENT_AMBULATORY_CARE_PROVIDER_SITE_OTHER): Payer: BLUE CROSS/BLUE SHIELD | Admitting: Family Medicine

## 2014-07-19 ENCOUNTER — Ambulatory Visit
Admission: RE | Admit: 2014-07-19 | Discharge: 2014-07-19 | Disposition: A | Discharge status: Home / Self Care | Payer: BLUE CROSS/BLUE SHIELD | Source: Ambulatory Visit | Attending: Family Medicine | Admitting: Family Medicine

## 2014-07-19 VITALS — BP 128/80 | HR 82 | Temp 98.6°F | Ht 67.0 in | Wt 154.8 lb

## 2014-07-19 DIAGNOSIS — M546 Pain in thoracic spine: Secondary | ICD-10-CM | POA: Diagnosis not present

## 2014-07-19 DIAGNOSIS — M549 Dorsalgia, unspecified: Secondary | ICD-10-CM | POA: Insufficient documentation

## 2014-07-19 MED ORDER — CYCLOBENZAPRINE HCL 5 MG PO TABS: 5.0000 mg | ORAL_TABLET | Freq: Three times a day (TID) | ORAL | Status: DC | PRN

## 2014-07-19 MED ORDER — TRAMADOL HCL 50 MG PO TABS: 50.0000 mg | ORAL_TABLET | Freq: Three times a day (TID) | ORAL | Status: DC | PRN
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
## 2014-07-19 NOTE — Telephone Encounter (Signed)
Called and discussed XR results. No fracture or etiology shown on XR. She understands.   Laroy Apple, MD [facility] Resident, PGY-3 07/19/2014, 5:16 PM

## 2014-07-19 NOTE — Patient Instructions (Signed)
Great to meet you!  Try the tramadol for your pain Try the flexeril for muscle spasms  At night  Go get the X rays at your convenience.   Please follow up with Dr. Venetia Maxon in 3-4 weeks.

## 2014-07-24 ENCOUNTER — Other Ambulatory Visit: Payer: Self-pay | Admitting: Family Medicine

## 2014-07-25 ENCOUNTER — Other Ambulatory Visit: Payer: Self-pay | Admitting: Family Medicine

## 2014-07-31 ENCOUNTER — Other Ambulatory Visit (HOSPITAL_BASED_OUTPATIENT_CLINIC_OR_DEPARTMENT_OTHER): Payer: BLUE CROSS/BLUE SHIELD

## 2014-07-31 DIAGNOSIS — C50419 Malignant neoplasm of upper-outer quadrant of unspecified female breast: Secondary | ICD-10-CM

## 2014-07-31 DIAGNOSIS — C50412 Malignant neoplasm of upper-outer quadrant of left female breast: Secondary | ICD-10-CM

## 2014-07-31 DIAGNOSIS — D702 Other drug-induced agranulocytosis: Secondary | ICD-10-CM

## 2014-07-31 LAB — COMPREHENSIVE METABOLIC PANEL (CC13)
ALBUMIN: 3.8 g/dL (ref 3.5–5.0)
ALT: 18 U/L (ref 0–55)
AST: 15 U/L (ref 5–34)
Alkaline Phosphatase: 154 U/L — ABNORMAL HIGH (ref 40–150)
Anion Gap: 7 mEq/L (ref 3–11)
BUN: 13.1 mg/dL (ref 7.0–26.0)
CHLORIDE: 107 meq/L (ref 98–109)
CO2: 29 meq/L (ref 22–29)
CREATININE: 0.8 mg/dL (ref 0.6–1.1)
Calcium: 9.8 mg/dL (ref 8.4–10.4)
EGFR: >90 mL/min/{1.73_m2} (ref >90)
GLUCOSE: 93 mg/dL (ref 70–140)
Potassium: 4.4 mEq/L (ref 3.5–5.1)
SODIUM: 143 meq/L (ref 136–145)
Total Bilirubin: 0.32 mg/dL (ref 0.20–1.20)
Total Protein: 7.1 g/dL (ref 6.4–8.3)

## 2014-07-31 LAB — CBC WITH DIFFERENTIAL/PLATELET
BASO%: 0.9 % (ref 0.0–2.0)
Basophils Absolute: 0 10*3/uL (ref 0.0–0.1)
EOS%: 0.8 % (ref 0.0–7.0)
Eosinophils Absolute: 0 10*3/uL (ref 0.0–0.5)
HEMATOCRIT: 42 % (ref 34.8–46.6)
HGB: 13.2 g/dL (ref 11.6–15.9)
LYMPH%: 31.8 % (ref 14.0–49.7)
MCH: 27.6 pg (ref 25.1–34.0)
MCHC: 31.5 g/dL (ref 31.5–36.0)
MCV: 87.6 fL (ref 79.5–101.0)
MONO#: 0.5 10*3/uL (ref 0.1–0.9)
MONO%: 10.5 % (ref 0.0–14.0)
NEUT#: 2.5 10*3/uL (ref 1.5–6.5)
NEUT%: 56 % (ref 38.4–76.8)
PLATELETS: 244 10*3/uL (ref 145–400)
RBC: 4.8 10*6/uL (ref 3.70–5.45)
RDW: 13.8 % (ref 11.2–14.5)
WBC: 4.4 10*3/uL (ref 3.9–10.3)
lymph#: 1.4 10*3/uL (ref 0.9–3.3)

## 2014-08-07 ENCOUNTER — Ambulatory Visit: Payer: BC Managed Care – PPO | Admitting: Oncology

## 2014-08-10 ENCOUNTER — Ambulatory Visit: Payer: BLUE CROSS/BLUE SHIELD | Admitting: Family Medicine

## 2014-08-23 ENCOUNTER — Ambulatory Visit
Admission: RE | Admit: 2014-08-23 | Discharge: 2014-08-23 | Disposition: A | Discharge status: Home / Self Care | Payer: BLUE CROSS/BLUE SHIELD | Source: Ambulatory Visit | Attending: Nurse Practitioner | Admitting: Nurse Practitioner

## 2014-08-23 ENCOUNTER — Other Ambulatory Visit: Payer: Self-pay | Admitting: Nurse Practitioner

## 2014-08-23 ENCOUNTER — Ambulatory Visit (HOSPITAL_BASED_OUTPATIENT_CLINIC_OR_DEPARTMENT_OTHER): Payer: BLUE CROSS/BLUE SHIELD | Admitting: Nurse Practitioner

## 2014-08-23 ENCOUNTER — Encounter: Payer: Self-pay | Admitting: Nurse Practitioner

## 2014-08-23 ENCOUNTER — Telehealth: Payer: Self-pay | Admitting: Nurse Practitioner

## 2014-08-23 VITALS — BP 120/73 | HR 71 | Temp 98.3°F | Resp 18 | Ht 67.0 in | Wt 153.7 lb

## 2014-08-23 DIAGNOSIS — C50412 Malignant neoplasm of upper-outer quadrant of left female breast: Secondary | ICD-10-CM

## 2014-08-23 DIAGNOSIS — Z171 Estrogen receptor negative status [ER-]: Secondary | ICD-10-CM

## 2014-08-23 DIAGNOSIS — N63 Unspecified lump in breast: Secondary | ICD-10-CM

## 2014-08-23 DIAGNOSIS — R2232 Localized swelling, mass and lump, left upper limb: Secondary | ICD-10-CM | POA: Insufficient documentation

## 2014-08-23 NOTE — Telephone Encounter (Signed)
, °

## 2014-08-23 NOTE — Progress Notes (Signed)
ID: [name] OB: [birth year]  MR#: [id number]  CSN#:[id number]  PCP: Ana See, Young GYN:   SU: Ana Young OTHER Young: Ana Young, Ana Young  CHIEF COMPLAINT:  Left  Breast Cancer CURRENT TREATMENT: Observation  BREAST CANCER HISTORY:  Ana Young herself palpated a mass in her left breast 01/15/2013 and brought it to the attention of Ana Young, who arranged for her to have bilateral digital mammography at the breast Center 01/27/2013. The patient does have heterogeneously dense breasts. In the outer upper left breast there was an oval mass by mammography measuring approximately 1.5 cm, which by palpation was firm and measured approximately 1 cm. Ultrasound showed this to be hypoechoic and to measure 1.4 cm.  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  The patient then underwent bilateral breast MRI 02/28/2013. In the left breast upper-outer quadrant the mass in question measured 2.9 cm. There was a second mass lateral to it measuring 1.0 cm. There were no additional abnormal appearing lymph nodes and no evidence of internal mammary adenopathy.  In the right breast there were 2 areas of irregular enhancement in the superior central third of the breast, both measuring 9 mm. These were felt to be worrisome for a ductal carcinoma in situ.  The patient's case was discussed at the multidisciplinary breast cancer conference 03/02/2013. Her subsequent history is as detailed below.  INTERVAL HISTORY: Ana Young returns today for follow up of her breast cancer, accompanied by her husband Ana Young. For the past week she has palpated 1-3 small pea-sized masses to her left axilla. They come and go. She is only able to locate one today. She has a history of left upper extremity edema and wears a compression sleeve full time. She has not felt any of these  passes prior to last Saturday. Her mammogram in October was completely benign. Otherwise the interval history is unremarkable. She does not work and has time to exercise, but deems herself "too lazy" to go out and do much.   ROS:  Ana Young denies fevers, chills, nausea or vomiting. She experiences some constipation but takes stool softeners to rectify this. She denies shortness of breath, chest pain, cough, or palpitations. She is eating, drinking, and sleeping well. She denies headaches, dizziness, unexplained weight loss, fatigue, bruising, or bleeding. She has no night sweats, vaginal changes, or pain. A detailed review of systems is otherwise stable.  PAST MEDICAL HISTORY: Past Medical History  Diagnosis Date  . Depression   . Vitamin D deficiency   . Anxiety   . Seasonal allergies   . HTN (hypertension)   . Insomnia   . History of migraines   . Cancer     Left breast cancer  . Breast cancer   . Anemia 06/07/2013  . Drug induced neutropenia(288.03) 08/02/2013  . Radiation 09/29/13-11/24/13    Left Breast triple negative 60.4 Gy    PAST SURGICAL HISTORY: Past Surgical History  Procedure Laterality Date  . Bunionectomy Left 2009  . Portacath placement Left 03/09/2013    Procedure: INSERTION PORT-A-CATH;  Surgeon: Ana Young;  Location: [facility];  Service: General;  Laterality: Left;  . Partial mastectomy with needle localization and axillary sentinel lymph node bx Left 08/16/2013    Procedure: PARTIAL MASTECTOMY WITH NEEDLE LOCALIZATION AND AXILLARY SENTINEL LYMPH NODE BIOPSY AND PORT A CATH REMOVAL;  Surgeon: Ana Young;  Location: [facility];  Service: General;  Laterality: Left;    FAMILY HISTORY Family History  Problem Relation Age of Onset  . Heart attack Brother 68  . Congestive Heart Failure Mother   . Stroke Father   . Diabetes Father   . Breast cancer Maternal Grandmother 95   the patient's parents are in their 78s. The patient  has one brother, no sisters. The patient's maternal grandmother was diagnosed with breast cancer the age of 84. There is no other history of breast cancer in the family to the patient's knowledge, and no history of ovarian cancer  GYNECOLOGIC HISTORY:  Menarche age 65, first live birth age 84, the patient is GX P2. Her periods are irregular, but still every 2-3 months, and otherwise unremarkable.  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 Son Ana Young lives in [place] and works in Scientist, research (medical). Daughter Ana Young is 55 year old and at home. The patient has no grandchildren. She attends the [employer]    ADVANCED DIRECTIVES: Not in place  HEALTH MAINTENANCE:  (Updated January 2015) History  Substance Use Topics  . Smoking status: Never Smoker   . Smokeless tobacco: Never Used  . Alcohol Use: No     Colonoscopy: - Never  PAP: Feb 2012, Dr. Buelah Young  Bone density: - Never  Lipid panel:  Dec 2012, Dr. Zigmund Young   No Known Allergies  Current Outpatient Prescriptions  Medication Sig Dispense Refill  . buPROPion (WELLBUTRIN XL) 300 MG 24 hr tablet TAKE 1 TABLET (300 MG TOTAL) BY MOUTH DAILY. 30 tablet 3  . docusate sodium (COLACE) 100 MG capsule Take 1-2 capsules (100-200 mg total) by mouth every morning. As directed for constipation 30 capsule 1  . ibuprofen (ADVIL,MOTRIN) 800 MG tablet TAKE 1 TABLET BY MOUTH EVERY 8 HOURS AS NEEDED FOR PAIN 50 tablet 2  . polyethylene glycol powder (MIRALAX) powder Take 17 g by mouth daily as needed (constipation). 255 g 1  . propranolol ER (INDERAL LA) 120 MG 24 hr capsule TAKE 1 CAPSULE (120 MG TOTAL) BY MOUTH DAILY. 93 capsule 0  . zolpidem (AMBIEN) 5 MG tablet TAKE 1 TABLET BY MOUTH AT BEDTIME AS NEEDED FOR SLEEP 30 tablet 3   No current facility-administered medications for  this visit.     Middle-aged [place] American woman in no acute distress Filed Vitals:   08/23/14 1315  BP: 120/73  Pulse: 71  Temp: 98.3 F (36.8 C)  Resp: 18       Body mass index is 24.07 kg/(m^2).    ECOG FS: 0 Filed Weights   08/23/14 1315  Weight: 153 lb 11.2 oz (69.718 kg)   Skin: warm, dry  HEENT: sclerae anicteric, conjunctivae pink, oropharynx clear. No thrush or mucositis.  Lymph Nodes: No cervical or supraclavicular lymphadenopathy  Lungs: clear to auscultation bilaterally, no rales, wheezes, or rhonci  Heart: regular rate and rhythm  Abdomen: round, soft, non tender, positive bowel sounds  Musculoskeletal: No focal spinal tenderness, no peripheral edema  Neuro: non focal, well oriented, positive affect  Breast: left breast status post lumpectomy and radiation. No evidence of recurrent disease within the breast. Left axilla positive for 1cm mobile mass, possibly inflamed lymph node. Right breast unremarkable.  LAB RESULTS:  Lab Results  Component Value Date   WBC 4.4 07/31/2014   NEUTROABS 2.5 07/31/2014  HGB 13.2 07/31/2014   HCT 42.0 07/31/2014   MCV 87.6 07/31/2014   PLT 244 07/31/2014      Chemistry      Component Value Date/Time   NA 143 07/31/2014 0809   NA 138 06/16/2011 0938   K 4.4 07/31/2014 0809   K 4.1 06/16/2011 0938   CL 110 06/16/2011 0938   CO2 29 07/31/2014 0809   CO2 26 06/16/2011 0938   BUN 13.1 07/31/2014 0809   BUN 10 06/16/2011 0938   CREATININE 0.8 07/31/2014 0809   CREATININE 0.81 06/16/2011 0938   CREATININE 0.74 07/26/2010 2332      Component Value Date/Time   CALCIUM 9.8 07/31/2014 0809   CALCIUM 9.5 06/16/2011 0938   ALKPHOS 154* 07/31/2014 0809   ALKPHOS 78 06/16/2011 0938   AST 15 07/31/2014 0809   AST 13 06/16/2011 0938   ALT 18 07/31/2014 0809   ALT 10 06/16/2011 0938   BILITOT 0.32 07/31/2014 0809   BILITOT 0.5 06/16/2011 0938      STUDIES: Most recent mammogram on 04/26/14 was  unremarkable.  ASSESSMENT: 54 y.o. [place] woman   (1)  status post left breast upper outer quadrant biopsy 02/22/2013 for a clinical T2 N0, stage IIA invasive ductal carcinoma, grade 3, triple negative, with an MIB-1 of 100%.    (2) MRI on 03/02/2013 showed an additional nodule at the 2:00 position, 5 mm lateral to the primary in the left breast, measuring 10 x 6 x 9 mm. This was biopsied on 03/11/2013 and was consistent with high-grade carcinoma [phone]). Prognostic panel is pending.   (3) MRI on 03/02/2013 also showed 2 areas of irregularity in the right breast which were worrisome for possible DCIS. Both of these areas were biopsied on 03/04/2013, and both showed fibrocystic changes with no evidence of malignancy ([id number]).   (4) genetics testing declined by patient after consultation with genetics counselor February 2015  (5)  treated in the neoadjuvant setting with   (a) 4 dose dense cycles of doxorubicin/ cyclophosphamide, with the first dose given on 03/15/2013 and the final one 04/26/2013, with a complete radiologic response  (b) followed by 10 weekly doses of carboplatin/paclitaxel started 05/17/2013, completed 07/19/2013, final two doses omitted because of neuropathy and cytopenias   (6) left lumpectomy and sentinel lymph node sampling 08/16/2013 showed a complete pathologic response.  (7) radiation therapy completed 11/22/2013  PLAN: The labs were reviewed in detail and were entirely stable. Physically she is also doing well, however, I was able to palpate a pea sized mass to her left axilla. As it has been less than 6 months since her mammogram, I have placed orders for an ultrasound of her left breast/axilla to be performed to investigate further.   Tentatively Elesia will return in 6 months for labs and a follow up visit, with a visit with Dr. Barry Dienes sometime in the interim. Of course, if the result of her ultrasound return suspicious, she will return sooner. I have  advised her to call for the results within the next week. She understands and agrees with this plan. She has been encouraged to call with any issues that might arise before her next visit here.    Marcelino Duster, NP   08/23/2014

## 2014-09-06 ENCOUNTER — Other Ambulatory Visit: Payer: Self-pay | Admitting: *Deleted

## 2014-09-07 MED ORDER — ZOLPIDEM TARTRATE 5 MG PO TABS: 5.0000 mg | ORAL_TABLET | Freq: Every evening | ORAL | Status: DC | PRN

## 2014-09-07 NOTE — Telephone Encounter (Signed)
Covering for PCP, refilled ambien X 1 month.   Laroy Apple, MD [place] Resident, PGY-3 09/07/2014, 8:33 AM

## 2014-09-07 NOTE — Telephone Encounter (Signed)
Patient informed. 

## 2014-10-16 ENCOUNTER — Other Ambulatory Visit: Payer: Self-pay | Admitting: Family Medicine

## 2014-10-21 ENCOUNTER — Other Ambulatory Visit: Payer: Self-pay | Admitting: Family Medicine

## 2014-10-24 NOTE — Telephone Encounter (Signed)
Tried calling patient, no answer, voicemail not set up. Will try again later.

## 2014-10-24 NOTE — Telephone Encounter (Signed)
Red Team: Please call pt to let her know she can pick up a printed Rx for Ambien at her convenience. Thanks! --CMS

## 2014-10-26 NOTE — Telephone Encounter (Signed)
Pt is aware that rx is ready for pick up. Jazmin Hartsell,CMA

## 2014-11-09 ENCOUNTER — Other Ambulatory Visit: Payer: Self-pay | Admitting: Family Medicine

## 2014-12-05 ENCOUNTER — Ambulatory Visit (INDEPENDENT_AMBULATORY_CARE_PROVIDER_SITE_OTHER): Payer: BLUE CROSS/BLUE SHIELD | Admitting: Family Medicine

## 2014-12-05 ENCOUNTER — Encounter: Payer: Self-pay | Admitting: Family Medicine

## 2014-12-05 VITALS — BP 121/76 | HR 76 | Temp 98.0°F | Ht 66.0 in | Wt 146.3 lb

## 2014-12-05 DIAGNOSIS — Z853 Personal history of malignant neoplasm of breast: Secondary | ICD-10-CM | POA: Insufficient documentation

## 2014-12-05 DIAGNOSIS — Z136 Encounter for screening for cardiovascular disorders: Secondary | ICD-10-CM | POA: Diagnosis not present

## 2014-12-05 DIAGNOSIS — R202 Paresthesia of skin: Secondary | ICD-10-CM | POA: Diagnosis not present

## 2014-12-05 LAB — LIPID PANEL
Cholesterol: 172 mg/dL (ref 0–200)
HDL: 75 mg/dL (ref >=46)
LDL CALC: 82 mg/dL (ref 0–99)
Total CHOL/HDL Ratio: 2.3 Ratio
Triglycerides: 75 mg/dL (ref <150)
VLDL: 15 mg/dL (ref 0–40)

## 2014-12-05 NOTE — Progress Notes (Signed)
   Subjective:    Patient ID: Ana Young, female    DOB: 1961/05/20, 54 y.o.   MRN: [id number]   HPI: Pt presents to clinic for SDA for migraine-type headaches and blurred vision that happened about 1 week ago. She had severe frontal headache-type pain bad enough to also caused some nausea and vomiting. This happened about 7 days ago and lasted 3-4 hours. Around the same time, she had some tingling discomfort in her left arm and neck / face, along with some tingling in her lower left side (all the way to her foot). This is some better but still persistent. She denies frank pain but feels like her sensation is altered. She does not have any weakness. She denies difficulty speaking or understanding language, but she is unsure if during the episode mentioned if she had any dysarthria or trouble speaking (she thinks her daughter may have had trouble understanding her for a brief period). She has had no further headaches or vision trouble. She has had some dizziness but no falls or frank weakness.  Of note, she is status post chemotherapy for left breast cancer, now following up only yearly with oncology. She is following what they think a lymph node under her left arm with serial ultrasounds, for now; this was noticed on a mammogram in February after she palpated a small lump under her left arm. She had some paresthesias in her fingers and toes secondary to her chemotherapy, but these more or less resolved after stopping chemo and her current symptoms feel different.  Review of Systems: As above. She has no fever, chills, further nausea / vomiting, belly pain, change in bowel / bladder habits, blood in stool, or cough / SOB.     Objective:   Physical Exam BP 121/76 mmHg  Pulse 76  Temp(Src) 98 F (36.7 C) (Oral)  Ht 5\' 6"  (1.676 m)  Wt 146 lb 5 oz (66.367 kg)  BMI 23.63 kg/m2 Gen: well-appearing adult female in NAD HEENT: [place]/AT, EOMI, PERRLA, MMM Neck: supple, normal ROM, no  lymphadenopathy Cardio: RRR, no murmur appreciated Pulm: CTAB, no wheezes, normal WOB Abd: soft, nontender, BS+ Ext: warm, well-perfused, no LE edema Neuro: alert, oriented, CN II-XII grossly intact with exception of diminished sensation in all 3 divisions of CN V on the LEFT  Strength 5/5 and symmetric bilaterally throughout extremities  Sensation grossly intact with exception of diminished sensation throughout dermatomes of LEFT upper and lower extremities     Assessment & Plan:  54yo female with paresthesias of left face, upper extremity, and lower extremity; precepted with Dr. Ree Kida - DDx broad, including but not limited to migraine with neurologic features, stroke / TIA, and brain mass (especially given hx of breast cancer) - ordered for MRI / MRA to eval for source and / or evidence of stroke as well as mass - counseled that if symptoms are related to migraine, they may clear on their own with time - will check lipid panel today to see if pt would benefit from statin therapy, but may elect to start this regardless (?secondary prevention) - specific f/u will depend on results; if stroke seen, may consider referral to neurology - otherwise, f/u PRN  Emmaline Kluver, MD PGY-3, [facility] 12/05/2014, 1:54 PM

## 2014-12-05 NOTE — Patient Instructions (Signed)
Thank you for coming in, today!  Your symptoms may be related to a migraine but this could have been a stroke or "mini-stroke" Your symptoms may get better on their own, over time.  We will check your blood today to see if you should be on a cholesterol medicine, to reduce the risk of stroke or heart attack in the future. We will also schedule an MRI to look at your brain and the blood vessels in your neck and head to see if this could have been a stroke.  Depending on what these results say, I will let you know when you should come back. If you have worse or similar symptoms again, go to the emergency room. Otherwise, continue your regular medications.  Please feel free to call with any questions or concerns at any time, at [phone]. --Dr. Venetia Maxon

## 2014-12-07 ENCOUNTER — Telehealth: Payer: Self-pay | Admitting: Family Medicine

## 2014-12-07 NOTE — Telephone Encounter (Signed)
Called pt to discuss lipid panel; numbers are actually in good ranges. Pt is not a diabetic, is a non-smoker, and is not on treatment for HTN, so doubt primary need for statin therapy at this time. Discussed possibly starting a statin mediation for (presumed) secondary prevention of stroke either now before MRI (scheduled on the 15th) or waiting until after the MRI to see if her recent symptoms were definitely a stroke or not (see office visit 5/31). Pt prefers to wait to see what MRI shows, first. Will f/u with pt likely by phone at that time and decide on further work-up, etc. Pt voiced understanding.  Ana Kluver, MD PGY-3, [facility] 12/07/2014, 1:26 PM

## 2014-12-20 ENCOUNTER — Telehealth: Payer: Self-pay | Admitting: *Deleted

## 2014-12-20 ENCOUNTER — Ambulatory Visit (HOSPITAL_COMMUNITY)
Admission: RE | Admit: 2014-12-20 | Discharge: 2014-12-20 | Disposition: A | Discharge status: Home / Self Care | Payer: BLUE CROSS/BLUE SHIELD | Source: Ambulatory Visit | Attending: Family Medicine | Admitting: Family Medicine

## 2014-12-20 ENCOUNTER — Ambulatory Visit (HOSPITAL_COMMUNITY): Admission: RE | Admit: 2014-12-20 | Payer: BLUE CROSS/BLUE SHIELD | Source: Ambulatory Visit

## 2014-12-20 ENCOUNTER — Telehealth: Payer: Self-pay | Admitting: Family Medicine

## 2014-12-20 DIAGNOSIS — R51 Headache: Secondary | ICD-10-CM | POA: Insufficient documentation

## 2014-12-20 DIAGNOSIS — R202 Paresthesia of skin: Secondary | ICD-10-CM | POA: Diagnosis not present

## 2014-12-20 DIAGNOSIS — R200 Anesthesia of skin: Secondary | ICD-10-CM | POA: Insufficient documentation

## 2014-12-20 DIAGNOSIS — Z853 Personal history of malignant neoplasm of breast: Secondary | ICD-10-CM | POA: Diagnosis not present

## 2014-12-20 LAB — POCT I-STAT CREATININE: Creatinine, Ser: 0.9 mg/dL (ref 0.44–1.00)

## 2014-12-20 MED ORDER — GADOBENATE DIMEGLUMINE 529 MG/ML IV SOLN
15.0000 mL | Freq: Once | INTRAVENOUS | Status: AC | PRN
Start: 2014-12-20 — End: 2014-12-20
  Administered 2014-12-20: 13 mL via INTRAVENOUS

## 2014-12-20 NOTE — Telephone Encounter (Signed)
Crystal from [facility] called to get verbal order to draw a creatine level due to pt's history of hypertension.  Verbal order given by Dr. Sharyn Lull, RN

## 2014-12-20 NOTE — Telephone Encounter (Signed)
Called pt to relay normal results. She is still having some "off and on" symptoms in her left side, with tingling but no pain. Expect this could be the result of some radiculopathy (resolving) but odd that it is both upper and lower extremities. Will watch and wait, for now. She will follow up as needed or if anything worsens or persists. --CMS

## 2014-12-20 NOTE — Telephone Encounter (Signed)
Attempted to call pt to relay normal MRI results and discuss any further paresthesias and to rediscuss statin therapy. Will reattempt call later today. --CMS

## 2014-12-31 ENCOUNTER — Other Ambulatory Visit: Payer: Self-pay | Admitting: Family Medicine

## 2015-01-09 ENCOUNTER — Other Ambulatory Visit: Payer: Self-pay | Admitting: Family Medicine

## 2015-02-09 ENCOUNTER — Other Ambulatory Visit: Payer: Self-pay | Admitting: Family Medicine

## 2015-02-20 ENCOUNTER — Other Ambulatory Visit: Payer: Self-pay | Admitting: *Deleted

## 2015-02-20 DIAGNOSIS — C50412 Malignant neoplasm of upper-outer quadrant of left female breast: Secondary | ICD-10-CM

## 2015-02-21 ENCOUNTER — Other Ambulatory Visit: Payer: BLUE CROSS/BLUE SHIELD

## 2015-02-21 ENCOUNTER — Ambulatory Visit: Payer: BLUE CROSS/BLUE SHIELD | Admitting: Oncology

## 2015-03-28 ENCOUNTER — Other Ambulatory Visit: Payer: Self-pay

## 2015-03-28 ENCOUNTER — Other Ambulatory Visit: Payer: Self-pay | Admitting: General Surgery

## 2015-03-28 DIAGNOSIS — Z853 Personal history of malignant neoplasm of breast: Secondary | ICD-10-CM

## 2015-04-11 ENCOUNTER — Other Ambulatory Visit: Payer: Self-pay | Admitting: *Deleted

## 2015-04-11 MED ORDER — IBUPROFEN 800 MG PO TABS: 800.0000 mg | ORAL_TABLET | Freq: Three times a day (TID) | ORAL | Status: DC | PRN

## 2015-04-15 ENCOUNTER — Other Ambulatory Visit: Payer: Self-pay | Admitting: Family Medicine

## 2015-05-02 ENCOUNTER — Ambulatory Visit
Admission: RE | Admit: 2015-05-02 | Discharge: 2015-05-02 | Disposition: A | Discharge status: Home / Self Care | Payer: BLUE CROSS/BLUE SHIELD | Source: Ambulatory Visit | Attending: General Surgery | Admitting: General Surgery

## 2015-05-02 DIAGNOSIS — Z853 Personal history of malignant neoplasm of breast: Secondary | ICD-10-CM

## 2015-05-07 ENCOUNTER — Encounter (HOSPITAL_COMMUNITY): Payer: Self-pay | Admitting: Emergency Medicine

## 2015-05-07 ENCOUNTER — Emergency Department (HOSPITAL_COMMUNITY)
Admission: EM | Admit: 2015-05-07 | Discharge: 2015-05-07 | Disposition: A | Discharge status: Home / Self Care | Payer: BLUE CROSS/BLUE SHIELD | Attending: Emergency Medicine | Admitting: Emergency Medicine

## 2015-05-07 DIAGNOSIS — Y9389 Activity, other specified: Secondary | ICD-10-CM | POA: Insufficient documentation

## 2015-05-07 DIAGNOSIS — S3992XA Unspecified injury of lower back, initial encounter: Secondary | ICD-10-CM | POA: Diagnosis not present

## 2015-05-07 DIAGNOSIS — Y998 Other external cause status: Secondary | ICD-10-CM | POA: Insufficient documentation

## 2015-05-07 DIAGNOSIS — S199XXA Unspecified injury of neck, initial encounter: Secondary | ICD-10-CM | POA: Diagnosis not present

## 2015-05-07 DIAGNOSIS — Z923 Personal history of irradiation: Secondary | ICD-10-CM | POA: Diagnosis not present

## 2015-05-07 DIAGNOSIS — M545 Low back pain, unspecified: Secondary | ICD-10-CM

## 2015-05-07 DIAGNOSIS — M62830 Muscle spasm of back: Secondary | ICD-10-CM | POA: Diagnosis not present

## 2015-05-07 DIAGNOSIS — Z79899 Other long term (current) drug therapy: Secondary | ICD-10-CM | POA: Diagnosis not present

## 2015-05-07 DIAGNOSIS — F419 Anxiety disorder, unspecified: Secondary | ICD-10-CM | POA: Insufficient documentation

## 2015-05-07 DIAGNOSIS — Z8639 Personal history of other endocrine, nutritional and metabolic disease: Secondary | ICD-10-CM | POA: Diagnosis not present

## 2015-05-07 DIAGNOSIS — Y92410 Unspecified street and highway as the place of occurrence of the external cause: Secondary | ICD-10-CM | POA: Insufficient documentation

## 2015-05-07 DIAGNOSIS — I10 Essential (primary) hypertension: Secondary | ICD-10-CM | POA: Insufficient documentation

## 2015-05-07 DIAGNOSIS — G43909 Migraine, unspecified, not intractable, without status migrainosus: Secondary | ICD-10-CM | POA: Diagnosis not present

## 2015-05-07 DIAGNOSIS — S5011XA Contusion of right forearm, initial encounter: Secondary | ICD-10-CM | POA: Insufficient documentation

## 2015-05-07 DIAGNOSIS — Z862 Personal history of diseases of the blood and blood-forming organs and certain disorders involving the immune mechanism: Secondary | ICD-10-CM | POA: Insufficient documentation

## 2015-05-07 DIAGNOSIS — G4700 Insomnia, unspecified: Secondary | ICD-10-CM | POA: Insufficient documentation

## 2015-05-07 DIAGNOSIS — F329 Major depressive disorder, single episode, unspecified: Secondary | ICD-10-CM | POA: Diagnosis not present

## 2015-05-07 DIAGNOSIS — M542 Cervicalgia: Secondary | ICD-10-CM

## 2015-05-07 DIAGNOSIS — S59911A Unspecified injury of right forearm, initial encounter: Secondary | ICD-10-CM | POA: Diagnosis present

## 2015-05-07 DIAGNOSIS — S40021A Contusion of right upper arm, initial encounter: Secondary | ICD-10-CM

## 2015-05-07 MED ORDER — CYCLOBENZAPRINE HCL 10 MG PO TABS: 10.0000 mg | ORAL_TABLET | Freq: Three times a day (TID) | ORAL | Status: DC | PRN

## 2015-05-07 MED ORDER — NAPROXEN 250 MG PO TABS
500.0000 mg | ORAL_TABLET | Freq: Once | ORAL | Status: AC
  Administered 2015-05-07: 500 mg via ORAL
  Filled 2015-05-07: qty 2

## 2015-05-07 MED ORDER — NAPROXEN 500 MG PO TABS: 500.0000 mg | ORAL_TABLET | Freq: Two times a day (BID) | ORAL | Status: DC | PRN

## 2015-05-07 NOTE — ED Notes (Signed)
Called patient to triage. Unable to locate patient at this time.
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
## 2015-05-07 NOTE — ED Notes (Signed)
Declined W/C at D/C and was escorted to lobby by RN. 
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
## 2015-05-11 ENCOUNTER — Other Ambulatory Visit: Payer: Self-pay | Admitting: Family Medicine

## 2015-05-16 ENCOUNTER — Ambulatory Visit: Payer: BLUE CROSS/BLUE SHIELD | Admitting: Internal Medicine

## 2015-05-16 NOTE — Telephone Encounter (Signed)
Patient has an appointment scheduled tomorrow. Will refill if appropriate at that time.

## 2015-05-17 ENCOUNTER — Encounter: Payer: Self-pay | Admitting: Internal Medicine

## 2015-05-17 ENCOUNTER — Ambulatory Visit (INDEPENDENT_AMBULATORY_CARE_PROVIDER_SITE_OTHER): Payer: BLUE CROSS/BLUE SHIELD | Admitting: Internal Medicine

## 2015-05-17 VITALS — BP 130/88 | HR 68 | Temp 98.1°F | Ht 66.0 in | Wt 140.0 lb

## 2015-05-17 DIAGNOSIS — F411 Generalized anxiety disorder: Secondary | ICD-10-CM | POA: Diagnosis not present

## 2015-05-17 DIAGNOSIS — F329 Major depressive disorder, single episode, unspecified: Secondary | ICD-10-CM

## 2015-05-17 DIAGNOSIS — I10 Essential (primary) hypertension: Secondary | ICD-10-CM | POA: Diagnosis not present

## 2015-05-17 DIAGNOSIS — Z0000 Encounter for general adult medical examination without abnormal findings: Secondary | ICD-10-CM

## 2015-05-17 DIAGNOSIS — F32A Depression, unspecified: Secondary | ICD-10-CM

## 2015-05-17 DIAGNOSIS — G4700 Insomnia, unspecified: Secondary | ICD-10-CM

## 2015-05-17 DIAGNOSIS — M549 Dorsalgia, unspecified: Secondary | ICD-10-CM

## 2015-05-17 MED ORDER — BUPROPION HCL ER (XL) 300 MG PO TB24: ORAL_TABLET | ORAL | Status: DC

## 2015-05-17 MED ORDER — PROPRANOLOL HCL ER 120 MG PO CP24: ORAL_CAPSULE | ORAL | Status: DC

## 2015-05-17 MED ORDER — NAPROXEN 500 MG PO TABS: 500.0000 mg | ORAL_TABLET | Freq: Two times a day (BID) | ORAL | Status: DC | PRN

## 2015-05-17 NOTE — Assessment & Plan Note (Signed)
Stable. Patient compliant with medication and symptoms are well-controlled.  - Continue Wellbutrin
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
## 2015-05-17 NOTE — Assessment & Plan Note (Signed)
Stable. BP 130/88 today. Compliant with medications.  - Continue propranolol
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
## 2015-05-17 NOTE — Assessment & Plan Note (Signed)
Stable. Patient taking Ambien nightly. Discussed goal of using only PRN and habits of good sleep hygiene.  - Continue Ambien
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
## 2015-05-18 LAB — HEPATITIS C ANTIBODY: HCV Ab: NEGATIVE

## 2015-06-28 ENCOUNTER — Other Ambulatory Visit: Payer: Self-pay | Admitting: Family Medicine

## 2015-07-03 ENCOUNTER — Other Ambulatory Visit: Payer: Self-pay | Admitting: Internal Medicine

## 2015-07-03 MED ORDER — ZOLPIDEM TARTRATE 5 MG PO TABS: 5.0000 mg | ORAL_TABLET | Freq: Every evening | ORAL | Status: DC | PRN

## 2015-07-03 NOTE — Telephone Encounter (Signed)
Pt needs a refill on zolpidem (AMBIEN) 5 MG tablet  Sent to regular pharmacy. Ana Young, ASA

## 2015-07-23 ENCOUNTER — Ambulatory Visit (INDEPENDENT_AMBULATORY_CARE_PROVIDER_SITE_OTHER): Payer: Managed Care, Other (non HMO)

## 2015-07-23 ENCOUNTER — Ambulatory Visit (INDEPENDENT_AMBULATORY_CARE_PROVIDER_SITE_OTHER): Payer: Managed Care, Other (non HMO) | Admitting: Podiatry

## 2015-07-23 ENCOUNTER — Encounter: Payer: Self-pay | Admitting: Podiatry

## 2015-07-23 VITALS — BP 122/76 | HR 69 | Resp 12

## 2015-07-23 DIAGNOSIS — M79672 Pain in left foot: Secondary | ICD-10-CM

## 2015-07-23 DIAGNOSIS — Q828 Other specified congenital malformations of skin: Secondary | ICD-10-CM
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
## 2015-07-25 ENCOUNTER — Ambulatory Visit: Payer: Self-pay | Admitting: Podiatry

## 2015-08-22 ENCOUNTER — Ambulatory Visit: Payer: Managed Care, Other (non HMO) | Attending: General Surgery | Admitting: Physical Therapy

## 2015-08-22 DIAGNOSIS — M25612 Stiffness of left shoulder, not elsewhere classified: Secondary | ICD-10-CM | POA: Diagnosis present

## 2015-08-22 DIAGNOSIS — Z9189 Other specified personal risk factors, not elsewhere classified: Secondary | ICD-10-CM

## 2015-08-22 DIAGNOSIS — R29898 Other symptoms and signs involving the musculoskeletal system: Secondary | ICD-10-CM | POA: Diagnosis present

## 2015-08-22 DIAGNOSIS — M629 Disorder of muscle, unspecified: Secondary | ICD-10-CM | POA: Insufficient documentation
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
## 2015-08-24 ENCOUNTER — Ambulatory Visit: Payer: Managed Care, Other (non HMO) | Admitting: Physical Therapy

## 2015-08-24 DIAGNOSIS — M629 Disorder of muscle, unspecified: Secondary | ICD-10-CM | POA: Diagnosis not present

## 2015-08-24 DIAGNOSIS — M25612 Stiffness of left shoulder, not elsewhere classified: Secondary | ICD-10-CM

## 2015-08-24 DIAGNOSIS — Z9189 Other specified personal risk factors, not elsewhere classified: Secondary | ICD-10-CM

## 2015-08-24 DIAGNOSIS — R29898 Other symptoms and signs involving the musculoskeletal system: Secondary | ICD-10-CM
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
## 2015-08-24 NOTE — Patient Instructions (Signed)
[email or web address] Lymphatic flow series with Shoosh Lettick Crotzer 

## 2015-08-29 ENCOUNTER — Encounter: Payer: Managed Care, Other (non HMO) | Admitting: Physical Therapy

## 2015-08-29 ENCOUNTER — Ambulatory Visit: Payer: Managed Care, Other (non HMO)

## 2015-08-29 DIAGNOSIS — R29898 Other symptoms and signs involving the musculoskeletal system: Secondary | ICD-10-CM

## 2015-08-29 DIAGNOSIS — M2420 Disorder of ligament, unspecified site: Secondary | ICD-10-CM

## 2015-08-29 DIAGNOSIS — M25612 Stiffness of left shoulder, not elsewhere classified: Secondary | ICD-10-CM

## 2015-08-29 DIAGNOSIS — Z9189 Other specified personal risk factors, not elsewhere classified: Secondary | ICD-10-CM

## 2015-08-29 DIAGNOSIS — M629 Disorder of muscle, unspecified: Secondary | ICD-10-CM | POA: Diagnosis not present
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
## 2015-08-31 ENCOUNTER — Ambulatory Visit: Payer: Managed Care, Other (non HMO) | Admitting: Physical Therapy

## 2015-08-31 DIAGNOSIS — Z9189 Other specified personal risk factors, not elsewhere classified: Secondary | ICD-10-CM

## 2015-08-31 DIAGNOSIS — M629 Disorder of muscle, unspecified: Secondary | ICD-10-CM | POA: Diagnosis not present

## 2015-08-31 DIAGNOSIS — M25612 Stiffness of left shoulder, not elsewhere classified: Secondary | ICD-10-CM

## 2015-08-31 DIAGNOSIS — R29898 Other symptoms and signs involving the musculoskeletal system: Secondary | ICD-10-CM
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
## 2015-09-03 ENCOUNTER — Ambulatory Visit (INDEPENDENT_AMBULATORY_CARE_PROVIDER_SITE_OTHER): Payer: Managed Care, Other (non HMO) | Admitting: Podiatry

## 2015-09-03 ENCOUNTER — Encounter: Payer: Self-pay | Admitting: Podiatry

## 2015-09-03 DIAGNOSIS — Q828 Other specified congenital malformations of skin: Secondary | ICD-10-CM

## 2015-09-03 NOTE — Progress Notes (Signed)
She presents today for follow-up of a callus and plantar aspect of her left foot.  Objective: Pulses are strongly palpable. No calf pain. Evaluation of the callus demonstrates much decrease in size and thickness from previous evaluation.  Assessment: Well-healing callus plantar flexed fourth metatarsal left foot.  Plan: Debrided today mechanically and placed salicylic acid under occlusion will follow up with her in 1 month at which time we will scan her first set of orthotics. We sought clarification of whether or not insurance will help her cover this and at this point it covers 90%.

## 2015-09-04 ENCOUNTER — Ambulatory Visit: Payer: Managed Care, Other (non HMO) | Admitting: Physical Therapy

## 2015-09-04 ENCOUNTER — Encounter: Payer: Self-pay | Admitting: Physical Therapy

## 2015-09-04 DIAGNOSIS — M629 Disorder of muscle, unspecified: Secondary | ICD-10-CM

## 2015-09-04 DIAGNOSIS — R29898 Other symptoms and signs involving the musculoskeletal system: Secondary | ICD-10-CM

## 2015-09-04 DIAGNOSIS — M25612 Stiffness of left shoulder, not elsewhere classified: Secondary | ICD-10-CM
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
## 2015-09-04 NOTE — Patient Instructions (Signed)
Over Head Pull: Narrow Grip     [place] [phone]   On back, knees bent, feet flat, band across thighs, elbows straight but relaxed. Pull hands apart (start). Keeping elbows straight, bring arms up and over head, hands toward floor. Keep pull steady on band. Hold momentarily. Return slowly, keeping pull steady, back to start. Repeat __10_ times. Band color ___yellow ___   Side Pull: Double Arm   On back, knees bent, feet flat. Arms perpendicular to body, shoulder level, elbows straight but relaxed. Pull arms out to sides, elbows straight. Resistance band comes across collarbones, hands toward floor. Hold momentarily. Slowly return to starting position. Repeat _10__ times. Band color __yellow___   Sash   On back, knees bent, feet flat, left hand on left hip, right hand above left. Pull right arm DIAGONALLY (hip to shoulder) across chest. Bring right arm along head toward floor. Hold momentarily. Slowly return to starting position. Repeat __10_ times. Do with left arm. Band color ___yellow__   Shoulder Rotation: Double Arm   On back, knees bent, feet flat, elbows tucked at sides, bent 90, hands palms up. Pull hands apart and down toward floor, keeping elbows near sides. Hold momentarily. Slowly return to starting position. Repeat _10__ times. Band color __yellow____    

## 2015-09-05 ENCOUNTER — Ambulatory Visit: Payer: Managed Care, Other (non HMO) | Attending: General Surgery

## 2015-09-05 DIAGNOSIS — M629 Disorder of muscle, unspecified: Secondary | ICD-10-CM | POA: Diagnosis present

## 2015-09-05 DIAGNOSIS — R29898 Other symptoms and signs involving the musculoskeletal system: Secondary | ICD-10-CM | POA: Diagnosis present

## 2015-09-05 DIAGNOSIS — M542 Cervicalgia: Secondary | ICD-10-CM | POA: Diagnosis present

## 2015-09-05 DIAGNOSIS — M25612 Stiffness of left shoulder, not elsewhere classified: Secondary | ICD-10-CM | POA: Diagnosis present

## 2015-09-05 DIAGNOSIS — Z9189 Other specified personal risk factors, not elsewhere classified: Secondary | ICD-10-CM | POA: Diagnosis present
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
## 2015-09-06 ENCOUNTER — Ambulatory Visit: Payer: Managed Care, Other (non HMO) | Admitting: Physical Therapy

## 2015-09-11 ENCOUNTER — Ambulatory Visit: Payer: Managed Care, Other (non HMO) | Admitting: Physical Therapy

## 2015-09-11 DIAGNOSIS — Z9189 Other specified personal risk factors, not elsewhere classified: Secondary | ICD-10-CM

## 2015-09-11 DIAGNOSIS — M2420 Disorder of ligament, unspecified site: Secondary | ICD-10-CM

## 2015-09-11 DIAGNOSIS — M629 Disorder of muscle, unspecified: Secondary | ICD-10-CM | POA: Diagnosis not present

## 2015-09-11 DIAGNOSIS — R29898 Other symptoms and signs involving the musculoskeletal system: Secondary | ICD-10-CM

## 2015-09-11 DIAGNOSIS — M25612 Stiffness of left shoulder, not elsewhere classified: Secondary | ICD-10-CM
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
## 2015-09-14 ENCOUNTER — Encounter: Payer: Self-pay | Admitting: Internal Medicine

## 2015-09-14 ENCOUNTER — Ambulatory Visit (INDEPENDENT_AMBULATORY_CARE_PROVIDER_SITE_OTHER): Payer: Managed Care, Other (non HMO) | Admitting: Internal Medicine

## 2015-09-14 ENCOUNTER — Ambulatory Visit: Payer: Managed Care, Other (non HMO) | Admitting: Physical Therapy

## 2015-09-14 VITALS — BP 134/53 | HR 71 | Temp 98.3°F | Wt 145.2 lb

## 2015-09-14 DIAGNOSIS — R296 Repeated falls: Secondary | ICD-10-CM | POA: Diagnosis not present

## 2015-09-14 DIAGNOSIS — M2420 Disorder of ligament, unspecified site: Secondary | ICD-10-CM

## 2015-09-14 DIAGNOSIS — R29898 Other symptoms and signs involving the musculoskeletal system: Secondary | ICD-10-CM

## 2015-09-14 DIAGNOSIS — M629 Disorder of muscle, unspecified: Secondary | ICD-10-CM

## 2015-09-14 DIAGNOSIS — Z9189 Other specified personal risk factors, not elsewhere classified: Secondary | ICD-10-CM

## 2015-09-14 DIAGNOSIS — M25612 Stiffness of left shoulder, not elsewhere classified: Secondary | ICD-10-CM
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
## 2015-09-14 NOTE — Assessment & Plan Note (Signed)
Possibly related to leg pain/radiculopathy, however patient does not specifically correlate pain and falling and cannot describe what causes her to fall. Symptoms do not necessarily match classic description of sciatica but this is on differential. Given the patient's age and relative physical fitness as well as physical therapy twice weekly, would not expect such frequent falls. As such, there is concern for an underlying neurological process as cause of her falls.  - Referral to neuro for evaluation and EMG - Considered gabapentin for symptomatic relief, however will not prescribe at this time as not to mask symptoms and potentially skew neuro evaluation

## 2015-09-14 NOTE — Patient Instructions (Addendum)
It was nice seeing you again today Ana Young!  For your leg pain and falls, I have placed a referral to a neurologist for you. They will call you with the date and time of this appointment.   In the meantime, please perform the exercises listed below every day. Hopefully this will help with your symptoms and improve your leg strength.  If you have any questions or concerns, please feel free to call the office at any time.   Be well,  Dr. Avon Gully  EXERCISES  RANGE OF MOTION (ROM) AND STRETCHING EXERCISES - Sciatica Most people with sciatic will find that their symptoms worsen with either excessive bending forward (flexion) or arching at the low back (extension). The exercises which will help resolve your symptoms will focus on the opposite motion. Your physician, physical therapist or athletic trainer will help you determine which exercises will be most helpful to resolve your low back pain. Do not complete any exercises without first consulting with your clinician. Discontinue any exercises which worsen your symptoms until you speak to your clinician. If you have pain, numbness or tingling which travels down into your buttocks, leg or foot, the goal of the therapy is for these symptoms to move closer to your back and eventually resolve. Occasionally, these leg symptoms will get better, but your low back pain may worsen; this is typically an indication of progress in your rehabilitation. Be certain to be very alert to any changes in your symptoms and the activities in which you participated in the 24 hours prior to the change. Sharing this information with your clinician will allow him/her to most efficiently treat your condition. These exercises may help you when beginning to rehabilitate your injury. Your symptoms may resolve with or without further involvement from your physician, physical therapist or athletic trainer. While completing these exercises, remember:   Restoring tissue flexibility  helps normal motion to return to the joints. This allows healthier, less painful movement and activity.  An effective stretch should be held for at least 30 seconds.  A stretch should never be painful. You should only feel a gentle lengthening or release in the stretched tissue. FLEXION RANGE OF MOTION AND STRETCHING EXERCISES: STRETCH - Flexion, Single Knee to Chest   Lie on a firm bed or floor with both legs extended in front of you.  Keeping one leg in contact with the floor, bring your opposite knee to your chest. Hold your leg in place by either grabbing behind your thigh or at your knee.  Pull until you feel a gentle stretch in your low back.   Slowly release your grasp and repeat the exercise with the opposite side.  STRETCH - Flexion, Double Knee to Chest  Lie on a firm bed or floor with both legs extended in front of you.  Keeping one leg in contact with the floor, bring your opposite knee to your chest.  Tense your stomach muscles to support your back and then lift your other knee to your chest. Hold your legs in place by either grabbing behind your thighs or at your knees.  Pull both knees toward your chest until you feel a gentle stretch in your low back.   Tense your stomach muscles and slowly return one leg at a time to the floor.  STRETCH - Low Trunk Rotation   Lie on a firm bed or floor. Keeping your legs in front of you, bend your knees so they are both pointed toward the ceiling and  your feet are flat on the floor.  Extend your arms out to the side. This will stabilize your upper body by keeping your shoulders in contact with the floor.  Gently and slowly drop both knees together to one side until you feel a gentle stretch in your low back.   Tense your stomach muscles to support your low back as you bring your knees back to the starting position. Repeat the exercise to the other side.  EXTENSION RANGE OF MOTION AND FLEXIBILITY EXERCISES: STRETCH - Extension,  Prone on Elbows  Lie on your stomach on the floor, a bed will be too soft. Place your palms about shoulder width apart and at the height of your head.  Place your elbows under your shoulders. If this is too painful, stack pillows under your chest.  Allow your body to relax so that your hips drop lower and make contact more completely with the floor.  Slowly return to lying flat on the floor.  RANGE OF MOTION - Extension, Prone Press Ups  Lie on your stomach on the floor, a bed will be too soft. Place your palms about shoulder width apart and at the height of your head.  Keeping your back as relaxed as possible, slowly straighten your elbows while keeping your hips on the floor. You may adjust the placement of your hands to maximize your comfort. As you gain motion, your hands will come more underneath your shoulders.  Slowly return to lying flat on the floor.   STRENGTHENING EXERCISES - Sciatica  These exercises may help you when beginning to rehabilitate your injury. These exercises should be done near your "sweet spot." This is the neutral, low-back arch, somewhere between fully rounded and fully arched, that is your least painful position. When performed in this safe range of motion, these exercises can be used for people who have either a flexion or extension based injury. These exercises may resolve your symptoms with or without further involvement from your physician, physical therapist or athletic trainer. While completing these exercises, remember:   Muscles can gain both the endurance and the strength needed for everyday activities through controlled exercises.  Complete these exercises as instructed by your physician, physical therapist or athletic trainer. Progress with the resistance and repetition exercises only as your caregiver advises.  You may experience muscle soreness or fatigue, but the pain or discomfort you are trying to eliminate should never worsen during these  exercises. If this pain does worsen, stop and make certain you are following the directions exactly. If the pain is still present after adjustments, discontinue the exercise until you can discuss the trouble with your clinician. STRENGTHENING - Deep Abdominals, Pelvic Tilt   Lie on a firm bed or floor. Keeping your legs in front of you, bend your knees so they are both pointed toward the ceiling and your feet are flat on the floor.  Tense your lower abdominal muscles to press your low back into the floor. This motion will rotate your pelvis so that your tail bone is scooping upwards rather than pointing at your feet or into the floor.  STRENGTHENING - Abdominals, Crunches   Lie on a firm bed or floor. Keeping your legs in front of you, bend your knees so they are both pointed toward the ceiling and your feet are flat on the floor. Cross your arms over your chest.  Slightly tip your chin down without bending your neck.  Tense your abdominals and slowly lift your trunk high  enough to just clear your shoulder blades. Lifting higher can put excessive stress on the low back and does not further strengthen your abdominal muscles.  Control your return to the starting position.  STRENGTHENING - Quadruped, Opposite UE/LE Lift  Assume a hands and knees position on a firm surface. Keep your hands under your shoulders and your knees under your hips. You may place padding under your knees for comfort.  Find your neutral spine and gently tense your abdominal muscles so that you can maintain this position. Your shoulders and hips should form a rectangle that is parallel with the floor and is not twisted.  Keeping your trunk steady, lift your right hand no higher than your shoulder and then your left leg no higher than your hip. Make sure you are not holding your breath. Hold this position __________ seconds.  Continuing to keep your abdominal muscles tense and your back steady, slowly return to your  starting position. Repeat with the opposite arm and leg.  STRENGTHENING - Abdominals and Quadriceps, Straight Leg Raise   Lie on a firm bed or floor with both legs extended in front of you.  Keeping one leg in contact with the floor, bend the other knee so that your foot can rest flat on the floor.  Find your neutral spine, and tense your abdominal muscles to maintain your spinal position throughout the exercise.  Slowly lift your straight leg off the floor about 6 inches for a count of 15, making sure to not hold your breath.  Still keeping your neutral spine, slowly lower your leg all the way to the floor.  POSTURE AND BODY MECHANICS CONSIDERATIONS - Sciatica Keeping correct posture when sitting, standing or completing your activities will reduce the stress put on different body tissues, allowing injured tissues a chance to heal and limiting painful experiences. The following are general guidelines for improved posture. Your physician or physical therapist will provide you with any instructions specific to your needs. While reading these guidelines, remember:  The exercises prescribed by your provider will help you have the flexibility and strength to maintain correct postures.  The correct posture provides the optimal environment for your joints to work. All of your joints have less wear and tear when properly supported by a spine with good posture. This means you will experience a healthier, less painful body.  Correct posture must be practiced with all of your activities, especially prolonged sitting and standing. Correct posture is as important when doing repetitive low-stress activities (typing) as it is when doing a single heavy-load activity (lifting). RESTING POSITIONS Consider which positions are most painful for you when choosing a resting position. If you have pain with flexion-based activities (sitting, bending, stooping, squatting), choose a position that allows you to rest in a  less flexed posture. You would want to avoid curling into a fetal position on your side. If your pain worsens with extension-based activities (prolonged standing, working overhead), avoid resting in an extended position such as sleeping on your stomach. Most people will find more comfort when they rest with their spine in a more neutral position, neither too rounded nor too arched. Lying on a non-sagging bed on your side with a pillow between your knees, or on your back with a pillow under your knees will often provide some relief. Keep in mind, being in any one position for a prolonged period of time, no matter how correct your posture, can still lead to stiffness. PROPER SITTING POSTURE In order to  minimize stress and discomfort on your spine, you must sit with correct posture Sitting with good posture should be effortless for a healthy body. Returning to good posture is a gradual process. Many people can work toward this most comfortably by using various supports until they have the flexibility and strength to maintain this posture on their own. When sitting with proper posture, your ears will fall over your shoulders and your shoulders will fall over your hips. You should use the back of the chair to support your upper back. Your low back will be in a neutral position, just slightly arched. You may place a small pillow or folded towel at the base of your low back for support.  When working at a desk, create an environment that supports good, upright posture. Without extra support, muscles fatigue and lead to excessive strain on joints and other tissues. Keep these recommendations in mind: CHAIR:   A chair should be able to slide under your desk when your back makes contact with the back of the chair. This allows you to work closely.  The chair's height should allow your eyes to be level with the upper part of your monitor and your hands to be slightly lower than your elbows. BODY POSITION  Your feet  should make contact with the floor. If this is not possible, use a foot rest.  Keep your ears over your shoulders. This will reduce stress on your neck and low back. INCORRECT SITTING POSTURES   If you are feeling tired and unable to assume a healthy sitting posture, do not slouch or slump. This puts excessive strain on your back tissues, causing more damage and pain. Healthier options include:  Using more support, like a lumbar pillow.  Switching tasks to something that requires you to be upright or walking.  Talking a brief walk.  Lying down to rest in a neutral-spine position. PROLONGED STANDING WHILE SLIGHTLY LEANING FORWARD  When completing a task that requires you to lean forward while standing in one place for a long time, place either foot up on a stationary 2-4 inch high object to help maintain the best posture. When both feet are on the ground, the low back tends to lose its slight inward curve. If this curve flattens (or becomes too large), then the back and your other joints will experience too much stress, fatigue more quickly and can cause pain.  CORRECT STANDING POSTURES Proper standing posture should be assumed with all daily activities, even if they only take a few moments, like when brushing your teeth. As in sitting, your ears should fall over your shoulders and your shoulders should fall over your hips. You should keep a slight tension in your abdominal muscles to brace your spine. Your tailbone should point down to the ground, not behind your body, resulting in an over-extended swayback posture.  INCORRECT STANDING POSTURES  Common incorrect standing postures include a forward head, locked knees and/or an excessive swayback. WALKING Walk with an upright posture. Your ears, shoulders and hips should all line-up. PROLONGED ACTIVITY IN A FLEXED POSITION When completing a task that requires you to bend forward at your waist or lean over a low surface, try to find a way to  stabilize 3 of 4 of your limbs. You can place a hand or elbow on your thigh or rest a knee on the surface you are reaching across. This will provide you more stability so that your muscles do not fatigue as quickly. By keeping your  knees relaxed, or slightly bent, you will also reduce stress across your low back. CORRECT LIFTING TECHNIQUES DO :   Assume a wide stance. This will provide you more stability and the opportunity to get as close as possible to the object which you are lifting.  Tense your abdominals to brace your spine; then bend at the knees and hips. Keeping your back locked in a neutral-spine position, lift using your leg muscles. Lift with your legs, keeping your back straight.  Test the weight of unknown objects before attempting to lift them.  Try to keep your elbows locked down at your sides in order get the best strength from your shoulders when carrying an object.  Always ask for help when lifting heavy or awkward objects. INCORRECT LIFTING TECHNIQUES DO NOT:   Lock your knees when lifting, even if it is a small object.  Bend and twist. Pivot at your feet or move your feet when needing to change directions.  Assume that you cannot safely pick up a paperclip without proper posture.   This information is not intended to replace advice given to you by your health care provider. Make sure you discuss any questions you have with your health care provider.   Document Released: 06/23/2005 Document Revised: 11/07/2014 Document Reviewed: 10/05/2008 Elsevier Interactive Patient Education [employer].
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
## 2015-09-17 ENCOUNTER — Other Ambulatory Visit: Payer: Self-pay | Admitting: *Deleted

## 2015-09-17 DIAGNOSIS — R269 Unspecified abnormalities of gait and mobility: Secondary | ICD-10-CM

## 2015-09-18 ENCOUNTER — Ambulatory Visit: Payer: Managed Care, Other (non HMO)

## 2015-09-18 VITALS — BP 150/98

## 2015-09-18 DIAGNOSIS — M629 Disorder of muscle, unspecified: Secondary | ICD-10-CM | POA: Diagnosis not present

## 2015-09-18 DIAGNOSIS — Z9189 Other specified personal risk factors, not elsewhere classified: Secondary | ICD-10-CM

## 2015-09-18 DIAGNOSIS — M25612 Stiffness of left shoulder, not elsewhere classified: Secondary | ICD-10-CM

## 2015-09-18 DIAGNOSIS — M2420 Disorder of ligament, unspecified site: Secondary | ICD-10-CM

## 2015-09-18 DIAGNOSIS — R29898 Other symptoms and signs involving the musculoskeletal system: Secondary | ICD-10-CM
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
## 2015-09-19 ENCOUNTER — Encounter: Payer: Self-pay | Admitting: Gastroenterology

## 2015-09-21 ENCOUNTER — Ambulatory Visit: Payer: Managed Care, Other (non HMO)

## 2015-09-21 DIAGNOSIS — M2420 Disorder of ligament, unspecified site: Secondary | ICD-10-CM

## 2015-09-21 DIAGNOSIS — M629 Disorder of muscle, unspecified: Secondary | ICD-10-CM | POA: Diagnosis not present

## 2015-09-21 DIAGNOSIS — R29898 Other symptoms and signs involving the musculoskeletal system: Secondary | ICD-10-CM

## 2015-09-21 DIAGNOSIS — Z9189 Other specified personal risk factors, not elsewhere classified: Secondary | ICD-10-CM

## 2015-09-21 DIAGNOSIS — M25612 Stiffness of left shoulder, not elsewhere classified: Secondary | ICD-10-CM
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
## 2015-09-25 ENCOUNTER — Ambulatory Visit: Payer: Managed Care, Other (non HMO)

## 2015-09-25 DIAGNOSIS — M629 Disorder of muscle, unspecified: Secondary | ICD-10-CM | POA: Diagnosis not present

## 2015-09-25 DIAGNOSIS — R29898 Other symptoms and signs involving the musculoskeletal system: Secondary | ICD-10-CM

## 2015-09-25 DIAGNOSIS — M25612 Stiffness of left shoulder, not elsewhere classified: Secondary | ICD-10-CM

## 2015-09-25 DIAGNOSIS — Z9189 Other specified personal risk factors, not elsewhere classified: Secondary | ICD-10-CM
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
## 2015-09-27 ENCOUNTER — Other Ambulatory Visit: Payer: Self-pay | Admitting: *Deleted

## 2015-09-27 MED ORDER — BUPROPION HCL ER (XL) 300 MG PO TB24: ORAL_TABLET | ORAL | Status: DC

## 2015-09-28 ENCOUNTER — Ambulatory Visit: Payer: Managed Care, Other (non HMO) | Admitting: Physical Therapy

## 2015-09-28 DIAGNOSIS — M629 Disorder of muscle, unspecified: Secondary | ICD-10-CM | POA: Diagnosis not present

## 2015-09-28 DIAGNOSIS — M25612 Stiffness of left shoulder, not elsewhere classified: Secondary | ICD-10-CM

## 2015-09-28 DIAGNOSIS — Z9189 Other specified personal risk factors, not elsewhere classified: Secondary | ICD-10-CM

## 2015-09-28 DIAGNOSIS — M2420 Disorder of ligament, unspecified site: Secondary | ICD-10-CM

## 2015-09-28 DIAGNOSIS — R29898 Other symptoms and signs involving the musculoskeletal system: Secondary | ICD-10-CM
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
## 2015-10-02 ENCOUNTER — Ambulatory Visit (INDEPENDENT_AMBULATORY_CARE_PROVIDER_SITE_OTHER): Payer: Managed Care, Other (non HMO) | Admitting: Neurology

## 2015-10-02 DIAGNOSIS — R269 Unspecified abnormalities of gait and mobility: Secondary | ICD-10-CM

## 2015-10-02 NOTE — Procedures (Signed)
[facility]  [place], [place]  [place], [place] [address] Tel: [phone] Fax:  [phone] Test Date:  10/02/2015  Patient: Ana Young DOB: [birth year] Physician: Narda Amber, DO  Sex: Female Height: 5\' 7"  Ref Phys: Ruthann Cancer, M.D.  ID#: [id number] Temp: 36.0C Technician: Jerilynn Mages. Dean   Patient Complaints: This is a 55 year old female referred for evaluation of low back pain radiating into the right hip and leg.  NCV & EMG Findings: Extensive electrodiagnostic testing of the right lower extremity and additional studies of the left shows: 1. Bilateral sural and superficial peroneal sensory responses are within normal limits. 2. Bilateral peroneal and tibial motor responses are within normal limits. 3. Bilateral tibial H reflex studies are within normal limits. 4. There is no evidence of active or chronic motor axon loss changes affecting any of the tested muscles. Motor unit configuration and recruitment pattern is within normal limits.  Impression: This is a normal study of the lower extremities. In particular, there is no evidence of a lumbosacral radiculopathy or sensorimotor polyneuropathy.   ___________________________ Narda Amber, DO    Nerve Conduction Studies Anti Sensory Summary Table   Site NR Peak (ms) Norm Peak (ms) P-T Amp (V) Norm P-T Amp  Left Sup Peroneal Anti Sensory (Ant Lat Mall)  36C  12 cm    2.9 <4.6 16.7 >4  Right Sup Peroneal Anti Sensory (Ant Lat Mall)  36C  12 cm    2.5 <4.6 13.6 >4  Left Sural Anti Sensory (Lat Mall)  36C  Calf    3.3 <4.6 11.7 >4  Right Sural Anti Sensory (Lat Mall)  36C  Calf    3.8 <4.6 12.9 >4   Motor Summary Table   Site NR Onset (ms) Norm Onset (ms) O-P Amp (mV) Norm O-P Amp Site1 Site2 Delta-0 (ms) Dist (cm) Vel (m/s) Norm Vel (m/s)  Left Peroneal Motor (Ext Dig Brev)  36C  Ankle    3.4 <6.0 6.6 >2.5 B Fib Ankle 6.0 30.0 50 >40  B Fib    9.4  5.6  Poplt B Fib 1.9 10.0 53 >40  Poplt     11.3  4.3         Right Peroneal Motor (Ext Dig Brev)  36C  Ankle    3.0 <6.0 5.5 >2.5 B Fib Ankle 6.5 30.0 46 >40  B Fib    9.5  4.8  Poplt B Fib 1.6 10.0 63 >40  Poplt    11.1  4.6         Left Tibial Motor (Abd Hall Brev)  36C  Ankle    3.8 <6.0 10.3 >4 Knee Ankle 7.5 37.0 49 >40  Knee    11.3  5.5         Right Tibial Motor (Abd Hall Brev)  36C  Ankle    3.4 <6.0 10.1 >4 Knee Ankle 7.4 37.0 50 >40  Knee    10.8  6.6          H Reflex Studies   NR H-Lat (ms) Lat Norm (ms) L-R H-Lat (ms)  Left Tibial ([place])  36C     32.65 <35 0.82  Right Tibial ([place])  36C     33.47 <35 0.82   EMG   Side Muscle Ins Act Fibs Psw Fasc Number Recrt Dur Dur. Amp Amp. Poly Poly. Comment  Right AntTibialis Nml Nml Nml Nml Nml Nml Nml Nml Nml Nml Nml Nml N/A  Right Gastroc Nml Nml Nml  Nml Nml Nml Nml Nml Nml Nml Nml Nml N/A  Right Flex Dig Long Nml Nml Nml Nml Nml Nml Nml Nml Nml Nml Nml Nml N/A  Right GluteusMed Nml Nml Nml Nml Nml Nml Nml Nml Nml Nml Nml Nml N/A  Right BicepsFemS Nml Nml Nml Nml Nml Nml Nml Nml Nml Nml Nml Nml N/A  Right VastusLat Nml Nml Nml Nml Nml Nml Nml Nml Nml Nml Nml Nml N/A  Left VastusLat Nml Nml Nml Nml Nml Nml Nml Nml Nml Nml Nml Nml N/A  Left AntTibialis Nml Nml Nml Nml Nml Nml Nml Nml Nml Nml Nml Nml N/A  Left Gastroc Nml Nml Nml Nml Nml Nml Nml Nml Nml Nml Nml Nml N/A  Left GluteusMed Nml Nml Nml Nml Nml Nml Nml Nml Nml Nml Nml Nml N/A      Waveforms:

## 2015-10-03 ENCOUNTER — Ambulatory Visit: Payer: Managed Care, Other (non HMO) | Admitting: Physical Therapy

## 2015-10-03 DIAGNOSIS — Z9189 Other specified personal risk factors, not elsewhere classified: Secondary | ICD-10-CM

## 2015-10-03 DIAGNOSIS — M2420 Disorder of ligament, unspecified site: Secondary | ICD-10-CM

## 2015-10-03 DIAGNOSIS — R29898 Other symptoms and signs involving the musculoskeletal system: Secondary | ICD-10-CM

## 2015-10-03 DIAGNOSIS — M25612 Stiffness of left shoulder, not elsewhere classified: Secondary | ICD-10-CM

## 2015-10-03 DIAGNOSIS — M629 Disorder of muscle, unspecified: Secondary | ICD-10-CM | POA: Diagnosis not present
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
## 2015-10-05 ENCOUNTER — Ambulatory Visit: Payer: Managed Care, Other (non HMO) | Admitting: Physical Therapy

## 2015-10-05 DIAGNOSIS — M542 Cervicalgia: Secondary | ICD-10-CM

## 2015-10-05 DIAGNOSIS — M25612 Stiffness of left shoulder, not elsewhere classified: Secondary | ICD-10-CM

## 2015-10-05 DIAGNOSIS — M629 Disorder of muscle, unspecified: Secondary | ICD-10-CM | POA: Diagnosis not present
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
## 2015-10-10 ENCOUNTER — Encounter: Payer: Managed Care, Other (non HMO) | Admitting: Physical Therapy

## 2015-10-10 ENCOUNTER — Ambulatory Visit: Payer: Managed Care, Other (non HMO) | Attending: General Surgery | Admitting: Physical Therapy

## 2015-10-10 DIAGNOSIS — M6281 Muscle weakness (generalized): Secondary | ICD-10-CM | POA: Diagnosis present

## 2015-10-10 DIAGNOSIS — Z9189 Other specified personal risk factors, not elsewhere classified: Secondary | ICD-10-CM | POA: Insufficient documentation

## 2015-10-10 DIAGNOSIS — M629 Disorder of muscle, unspecified: Secondary | ICD-10-CM | POA: Diagnosis present

## 2015-10-10 DIAGNOSIS — M25612 Stiffness of left shoulder, not elsewhere classified: Secondary | ICD-10-CM | POA: Diagnosis present

## 2015-10-10 DIAGNOSIS — M542 Cervicalgia: Secondary | ICD-10-CM

## 2015-10-10 DIAGNOSIS — L599 Disorder of the skin and subcutaneous tissue related to radiation, unspecified: Secondary | ICD-10-CM | POA: Diagnosis present
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
## 2015-10-11 ENCOUNTER — Ambulatory Visit: Payer: Managed Care, Other (non HMO) | Admitting: Physical Therapy

## 2015-10-12 ENCOUNTER — Encounter: Payer: Managed Care, Other (non HMO) | Admitting: Physical Therapy

## 2015-10-15 ENCOUNTER — Telehealth: Payer: Self-pay | Admitting: Internal Medicine

## 2015-10-15 ENCOUNTER — Telehealth: Payer: Self-pay | Admitting: Neurology

## 2015-10-15 NOTE — Telephone Encounter (Signed)
Pt is calling because she had some test done at the Urologist office and they said that her PCP would call with her results. She has not heard anything and would like to know what they are. Ana Young

## 2015-10-15 NOTE — Telephone Encounter (Signed)
Pt needs to talk to someone about her test results please call [phone] she states that her Dr never got the result please call [phone]

## 2015-10-15 NOTE — Telephone Encounter (Signed)
Spoke with patient and informed her that I will re-send EMG.

## 2015-10-15 NOTE — Telephone Encounter (Signed)
Spoke with patient regarding results from neurology appointment. Do not see any records/notes from neurology appointment which patient reports was about two weeks ago. Patient to call neurologist's office to ask for results or to ask for office to fax results to [facility].

## 2015-10-16 ENCOUNTER — Ambulatory Visit: Payer: Managed Care, Other (non HMO) | Admitting: Physical Therapy

## 2015-10-16 ENCOUNTER — Telehealth: Payer: Self-pay | Admitting: Neurology

## 2015-10-16 DIAGNOSIS — M6281 Muscle weakness (generalized): Secondary | ICD-10-CM

## 2015-10-16 DIAGNOSIS — M542 Cervicalgia: Secondary | ICD-10-CM

## 2015-10-16 DIAGNOSIS — M25612 Stiffness of left shoulder, not elsewhere classified: Secondary | ICD-10-CM

## 2015-10-16 NOTE — Telephone Encounter (Signed)
Confirmed with clinic they are on epic. Spoke with [place]. Will send message to provider that records are in epic.
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
## 2015-10-16 NOTE — Patient Instructions (Addendum)
Trigger Point Dry Needling  . What is Trigger Point Dry Needling (DN)? o DN is a physical therapy technique used to treat muscle pain and dysfunction. Specifically, DN helps deactivate muscle trigger points (muscle knots).  o A thin filiform needle is used to penetrate the skin and stimulate the underlying trigger point. The goal is for a local twitch response (LTR) to occur and for the trigger point to relax. No medication of any kind is injected during the procedure.   . What Does Trigger Point Dry Needling Feel Like?  o The procedure feels different for each individual patient. Some patients report that they do not actually feel the needle enter the skin and overall the process is not painful. Very mild bleeding may occur. However, many patients feel a deep cramping in the muscle in which the needle was inserted. This is the local twitch response.   Marland Kitchen How Will I feel after the treatment? o Soreness is normal, and the onset of soreness may not occur for a few hours. Typically this soreness does not last longer than two days.  o Bruising is uncommon, however; ice can be used to decrease any possible bruising.  o In rare cases feeling tired or nauseous after the treatment is normal. In addition, your symptoms may get worse before they get better, this period will typically not last longer than 24 hours.   . What Can I do After My Treatment? o Increase your hydration by drinking more water for the next 24 hours. o You may place ice or heat on the areas treated that have become sore, however, do not use heat on inflamed or bruised areas. Heat often brings more relief post needling. o You can continue your regular activities, but vigorous activity is not recommended initially after the treatment for 24 hours. o DN is best combined with other physical therapy such as strengthening, stretching, and other therapies.   Levator Stretch  All the stretches hold onto chair with left hand to increase  stretch.   Grasp seat or sit on hand on side to be stretched. Turn head toward other side and look down. Use hand on head to gently stretch neck in that position. Hold  30____ seconds. Repeat on other side. Repeat __2-3__ times. Do __2-3__ sessions per day.  http://gt2.exer.us/30   Copyright  VHI. All rights reserved.  Side-Bending   One hand on opposite side of head, pull head to side as far as is comfortable. Stop if there is pain. Hold 30____ seconds. Repeat with other hand to other side. Repeat __2-3__ times. Do _2-3___ sessions per day.   Copyright  VHI. All rights reserved.  Chin Protraction / Retraction   Slide head forward keeping chin level. Slide head back, pulling chin in. Hold each position 30___ seconds. Repeat 2-3___ times. Do _2-3__ sessions per day.  Copyright  VHI. All rights reserved.  Voncille Lo, PT 10/16/2015 8:49 AM Phone: [phone] Fax: [phone]

## 2015-10-16 NOTE — Telephone Encounter (Signed)
Sent x2  

## 2015-10-16 NOTE — Telephone Encounter (Signed)
Patient's neurology office called stating that patient's results are in EPIC.  They have sent the results to PCP several times and should be in provider's in basket.  Derl Barrow, RN

## 2015-10-16 NOTE — Telephone Encounter (Signed)
Ana Young [birth year] she is a patient of Dr. Posey Pronto. She needs her EMG results sent to [facility].  Her call back number is [phone]. The EMG was done on 10/02/15. Thank you

## 2015-10-17 ENCOUNTER — Ambulatory Visit: Payer: Managed Care, Other (non HMO) | Admitting: Physical Therapy

## 2015-10-17 ENCOUNTER — Telehealth: Payer: Self-pay | Admitting: Internal Medicine

## 2015-10-17 DIAGNOSIS — M542 Cervicalgia: Secondary | ICD-10-CM | POA: Diagnosis not present

## 2015-10-17 DIAGNOSIS — M25612 Stiffness of left shoulder, not elsewhere classified: Secondary | ICD-10-CM

## 2015-10-17 DIAGNOSIS — L599 Disorder of the skin and subcutaneous tissue related to radiation, unspecified: Secondary | ICD-10-CM

## 2015-10-17 NOTE — Telephone Encounter (Signed)
Called patient regarding results of EMG from recent neurology appt. Explained that no neurological abnormalities were seen on EMG. Patient voiced understanding.   Adin Hector, MD PGY-1 [facility] Pager [phone]
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
## 2015-10-22 ENCOUNTER — Ambulatory Visit: Payer: Managed Care, Other (non HMO) | Admitting: Podiatry

## 2015-10-23 ENCOUNTER — Ambulatory Visit: Payer: Managed Care, Other (non HMO) | Admitting: Physical Therapy

## 2015-10-23 DIAGNOSIS — M542 Cervicalgia: Secondary | ICD-10-CM
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
## 2015-10-25 ENCOUNTER — Ambulatory Visit (AMBULATORY_SURGERY_CENTER): Payer: Self-pay

## 2015-10-25 ENCOUNTER — Ambulatory Visit: Payer: Managed Care, Other (non HMO) | Admitting: Physical Therapy

## 2015-10-25 VITALS — Ht 67.0 in | Wt 150.2 lb

## 2015-10-25 DIAGNOSIS — Z1211 Encounter for screening for malignant neoplasm of colon: Secondary | ICD-10-CM

## 2015-10-25 DIAGNOSIS — L599 Disorder of the skin and subcutaneous tissue related to radiation, unspecified: Secondary | ICD-10-CM

## 2015-10-25 DIAGNOSIS — M542 Cervicalgia: Secondary | ICD-10-CM | POA: Diagnosis not present
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
## 2015-10-25 NOTE — Progress Notes (Signed)
No allergies to eggs or soy No diet meds No home oxygen No past problems with anesthesia  Has email   And internet; registered for emmi  Has seen neurologist for mult falls and found "nothing"

## 2015-10-30 ENCOUNTER — Ambulatory Visit: Payer: Managed Care, Other (non HMO) | Admitting: Physical Therapy

## 2015-11-01 ENCOUNTER — Ambulatory Visit: Payer: Managed Care, Other (non HMO) | Admitting: Physical Therapy

## 2015-11-01 DIAGNOSIS — M542 Cervicalgia: Secondary | ICD-10-CM

## 2015-11-01 DIAGNOSIS — M629 Disorder of muscle, unspecified: Secondary | ICD-10-CM

## 2015-11-01 DIAGNOSIS — M2420 Disorder of ligament, unspecified site: Secondary | ICD-10-CM

## 2015-11-01 DIAGNOSIS — M25612 Stiffness of left shoulder, not elsewhere classified: Secondary | ICD-10-CM

## 2015-11-01 DIAGNOSIS — M6281 Muscle weakness (generalized): Secondary | ICD-10-CM

## 2015-11-01 NOTE — Patient Instructions (Signed)
Perform SCALENE Stretch as shown in clinic.  2 x 30 second hold  Place hands on sternum, Rotate neck to the left and extend neck until feeling comfortable stretch.  Voncille Lo, PT 11/01/2015 8:14 AM Phone: [phone] Fax: [phone]
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
## 2015-11-07 ENCOUNTER — Ambulatory Visit: Payer: Managed Care, Other (non HMO) | Attending: General Surgery

## 2015-11-07 DIAGNOSIS — M25612 Stiffness of left shoulder, not elsewhere classified: Secondary | ICD-10-CM | POA: Diagnosis present

## 2015-11-07 DIAGNOSIS — M6281 Muscle weakness (generalized): Secondary | ICD-10-CM | POA: Diagnosis present

## 2015-11-07 DIAGNOSIS — Z9189 Other specified personal risk factors, not elsewhere classified: Secondary | ICD-10-CM | POA: Diagnosis present

## 2015-11-07 DIAGNOSIS — M2420 Disorder of ligament, unspecified site: Secondary | ICD-10-CM

## 2015-11-07 DIAGNOSIS — M542 Cervicalgia: Secondary | ICD-10-CM

## 2015-11-07 DIAGNOSIS — M629 Disorder of muscle, unspecified: Secondary | ICD-10-CM | POA: Insufficient documentation
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
## 2015-11-08 ENCOUNTER — Ambulatory Visit (AMBULATORY_SURGERY_CENTER): Payer: Managed Care, Other (non HMO) | Admitting: Gastroenterology

## 2015-11-08 ENCOUNTER — Encounter: Payer: Self-pay | Admitting: Gastroenterology

## 2015-11-08 VITALS — BP 138/60 | HR 64 | Temp 97.8°F | Resp 14 | Ht 67.0 in | Wt 150.0 lb

## 2015-11-08 DIAGNOSIS — Z1211 Encounter for screening for malignant neoplasm of colon: Secondary | ICD-10-CM | POA: Diagnosis not present

## 2015-11-08 MED ORDER — SODIUM CHLORIDE 0.9 % IV SOLN: 500.0000 mL | INTRAVENOUS | Status: DC

## 2015-11-08 NOTE — Op Note (Signed)
[place] Patient Name: Ana Young Procedure Date: 11/08/2015 9:10 AM MRN: [id number] Endoscopist: Mallie Mussel L. Loletha Carrow , MD Age: 55 Date of Birth: [birth year] Gender: Female Procedure:                Colonoscopy Indications:              Screening for colorectal malignant neoplasm Medicines:                Monitored Anesthesia Care Procedure:                Pre-Anesthesia Assessment:                           - Prior to the procedure, a History and Physical                            was performed, and patient medications and                            allergies were reviewed. The patient's tolerance of                            previous anesthesia was also reviewed. The risks                            and benefits of the procedure and the sedation                            options and risks were discussed with the patient.                            All questions were answered, and informed consent                            was obtained. Prior Anticoagulants: The patient has                            taken no previous anticoagulant or antiplatelet                            agents. ASA Grade Assessment: II - A patient with                            mild systemic disease. After reviewing the risks                            and benefits, the patient was deemed in                            satisfactory condition to undergo the procedure.                           After obtaining informed consent, the colonoscope  was passed under direct vision. Throughout the                            procedure, the patient's blood pressure, pulse, and                            oxygen saturations were monitored continuously. The                            Model CF-HQ190L [id number]) scope was introduced                            through the anus and advanced to the the cecum,                            identified by appendiceal orifice and ileocecal                     valve. The colonoscopy was performed without                            difficulty. The patient tolerated the procedure                            well. The quality of the bowel preparation was                            excellent. The ileocecal valve, appendiceal                            orifice, and rectum were photographed. Scope In: 9:24:17 AM Scope Out: 9:35:52 AM Scope Withdrawal Time: 0 hours 8 minutes 17 seconds  Total Procedure Duration: 0 hours 11 minutes 35 seconds  Findings:                 The perianal and digital rectal examinations were                            normal.                           The entire examined colon appeared normal on direct                            and retroflexion views. Complications:            No immediate complications. Estimated Blood Loss:     Estimated blood loss: none. Impression:               - The entire examined colon is normal on direct and                            retroflexion views.                           - No specimens collected. Recommendation:           -  Patient has a contact number available for                            emergencies. The signs and symptoms of potential                            delayed complications were discussed with the                            patient. Return to normal activities tomorrow.                            Written discharge instructions were provided to the                            patient.                           - Resume previous diet.                           - Continue present medications.                           - Repeat colonoscopy in 10 years for screening                            purposes. Ileah Falkenstein L. Loletha Carrow, MD 11/08/2015 9:38:24 AM This report has been signed electronically.

## 2015-11-08 NOTE — Patient Instructions (Signed)
YOU HAD AN ENDOSCOPIC PROCEDURE TODAY AT [facility]:   Refer to the procedure report that was given to you for any specific questions about what was found during the examination.  If the procedure report does not answer your questions, please call your gastroenterologist to clarify.  If you requested that your care partner not be given the details of your procedure findings, then the procedure report has been included in a sealed envelope for you to review at your convenience later.  YOU SHOULD EXPECT: Some feelings of bloating in the abdomen. Passage of more gas than usual.  Walking can help get rid of the air that was put into your GI tract during the procedure and reduce the bloating. If you had a lower endoscopy (such as a colonoscopy or flexible sigmoidoscopy) you may notice spotting of blood in your stool or on the toilet paper. If you underwent a bowel prep for your procedure, you may not have a normal bowel movement for a few days.  Please Note:  You might notice some irritation and congestion in your nose or some drainage.  This is from the oxygen used during your procedure.  There is no need for concern and it should clear up in a day or so.  SYMPTOMS TO REPORT IMMEDIATELY:   Following lower endoscopy (colonoscopy or flexible sigmoidoscopy):  Excessive amounts of blood in the stool  Significant tenderness or worsening of abdominal pains  Swelling of the abdomen that is new, acute  Fever of 100F or higher   For urgent or emergent issues, a gastroenterologist can be reached at any hour by calling [phone].   DIET: Your first meal following the procedure should be a small meal and then it is ok to progress to your normal diet. Heavy or fried foods are harder to digest and may make you feel nauseous or bloated.  Likewise, meals heavy in dairy and vegetables can increase bloating.  Drink plenty of fluids but you should avoid alcoholic beverages for 24  hours.  ACTIVITY:  You should plan to take it easy for the rest of today and you should NOT DRIVE or use heavy machinery until tomorrow (because of the sedation medicines used during the test).    FOLLOW UP: Our staff will call the number listed on your records the next business day following your procedure to check on you and address any questions or concerns that you may have regarding the information given to you following your procedure. If we do not reach you, we will leave a message.  However, if you are feeling well and you are not experiencing any problems, there is no need to return our call.  We will assume that you have returned to your regular daily activities without incident.  If any biopsies were taken you will be contacted by phone or by letter within the next 1-3 weeks.  Please call us at [phone] if you have not heard about the biopsies in 3 weeks.    SIGNATURES/CONFIDENTIALITY: You and/or your care partner have signed paperwork which will be entered into your electronic medical record.  These signatures attest to the fact that that the information above on your After Visit Summary has been reviewed and is understood.  Full responsibility of the confidentiality of this discharge information lies with you and/or your care-partner.  Normal colonoscopy.  Repeat in 10 years-2027.

## 2015-11-08 NOTE — Progress Notes (Signed)
A/ox3 pleased with MAC, report to Report to [employer]

## 2015-11-09 ENCOUNTER — Ambulatory Visit: Payer: Managed Care, Other (non HMO) | Admitting: Physical Therapy

## 2015-11-09 ENCOUNTER — Other Ambulatory Visit: Payer: Self-pay | Admitting: Internal Medicine

## 2015-11-09 ENCOUNTER — Telehealth: Payer: Self-pay

## 2015-11-09 DIAGNOSIS — M542 Cervicalgia: Secondary | ICD-10-CM | POA: Diagnosis not present

## 2015-11-09 DIAGNOSIS — M2420 Disorder of ligament, unspecified site: Secondary | ICD-10-CM

## 2015-11-09 DIAGNOSIS — M25612 Stiffness of left shoulder, not elsewhere classified: Secondary | ICD-10-CM

## 2015-11-09 DIAGNOSIS — M629 Disorder of muscle, unspecified: Secondary | ICD-10-CM
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
## 2015-11-09 NOTE — Patient Instructions (Signed)
Hands in Loop of yellow band facing wall: 1. Wall walk up and down 5-10 times  2 Elbows back 5-10 times 3. Slide forearms up and down wall 5-10 times   Progress push ups.  Start with 5 and add one per day    Bird dog:   On hands and knees, raise opposite arm and leg. Work toward keep abdomen tight and staightening arm and leg   Squat will wall taps +++++++++++++++++++++++++++++++++

## 2015-11-09 NOTE — Telephone Encounter (Signed)
  Follow up Call-  Call back number 11/08/2015  Post procedure Call Back phone  # [phone]  Permission to leave phone message Yes     Patient questions:  Do you have a fever, pain , or abdominal swelling? No. Pain Score  0 *  Have you tolerated food without any problems? Yes.    Have you been able to return to your normal activities? Yes.    Do you have any questions about your discharge instructions: Diet   No. Medications  No. Follow up visit  No.  Do you have questions or concerns about your Care? No.  Actions: * If pain score is 4 or above: No action needed, pain <4.

## 2015-11-13 ENCOUNTER — Ambulatory Visit: Payer: Managed Care, Other (non HMO) | Admitting: Physical Therapy

## 2015-11-14 ENCOUNTER — Ambulatory Visit: Payer: Managed Care, Other (non HMO) | Admitting: Physical Therapy

## 2015-11-14 DIAGNOSIS — M542 Cervicalgia: Secondary | ICD-10-CM

## 2015-11-14 DIAGNOSIS — M6281 Muscle weakness (generalized): Secondary | ICD-10-CM

## 2015-11-14 DIAGNOSIS — M629 Disorder of muscle, unspecified: Secondary | ICD-10-CM

## 2015-11-14 DIAGNOSIS — M25612 Stiffness of left shoulder, not elsewhere classified: Secondary | ICD-10-CM
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
## 2015-11-15 ENCOUNTER — Encounter: Payer: Managed Care, Other (non HMO) | Admitting: Physical Therapy

## 2015-11-16 ENCOUNTER — Encounter: Payer: Managed Care, Other (non HMO) | Admitting: Physical Therapy

## 2015-11-20 ENCOUNTER — Encounter: Payer: Managed Care, Other (non HMO) | Admitting: Physical Therapy

## 2015-12-05 ENCOUNTER — Other Ambulatory Visit: Payer: Self-pay | Admitting: Internal Medicine

## 2015-12-11 ENCOUNTER — Encounter: Payer: Managed Care, Other (non HMO) | Admitting: Physical Therapy

## 2016-02-06 ENCOUNTER — Other Ambulatory Visit: Payer: Self-pay | Admitting: Internal Medicine

## 2016-03-09 ENCOUNTER — Other Ambulatory Visit: Payer: Self-pay | Admitting: Internal Medicine

## 2016-03-26 ENCOUNTER — Other Ambulatory Visit: Payer: Self-pay | Admitting: Internal Medicine

## 2016-04-10 ENCOUNTER — Other Ambulatory Visit: Payer: Self-pay | Admitting: General Surgery

## 2016-04-10 DIAGNOSIS — Z853 Personal history of malignant neoplasm of breast: Secondary | ICD-10-CM

## 2016-05-02 ENCOUNTER — Ambulatory Visit
Admission: RE | Admit: 2016-05-02 | Discharge: 2016-05-02 | Disposition: A | Discharge status: Home / Self Care | Payer: Managed Care, Other (non HMO) | Source: Ambulatory Visit | Attending: General Surgery | Admitting: General Surgery

## 2016-05-02 DIAGNOSIS — Z853 Personal history of malignant neoplasm of breast: Secondary | ICD-10-CM

## 2016-05-15 ENCOUNTER — Ambulatory Visit: Payer: BC Managed Care – PPO

## 2016-05-30 ENCOUNTER — Other Ambulatory Visit: Payer: Self-pay | Admitting: Internal Medicine

## 2016-06-11 ENCOUNTER — Other Ambulatory Visit: Payer: Self-pay | Admitting: Internal Medicine

## 2016-08-10 ENCOUNTER — Other Ambulatory Visit: Payer: Self-pay | Admitting: Internal Medicine

## 2016-09-05 DIAGNOSIS — C50412 Malignant neoplasm of upper-outer quadrant of left female breast: Secondary | ICD-10-CM | POA: Diagnosis not present

## 2016-09-22 ENCOUNTER — Other Ambulatory Visit: Payer: Self-pay | Admitting: Internal Medicine

## 2016-09-24 ENCOUNTER — Other Ambulatory Visit: Payer: Self-pay | Admitting: *Deleted

## 2016-09-24 NOTE — Telephone Encounter (Signed)
Patient has not been seen for insomnia since November 2016. Please let patient know she needs to schedule an appointment before I will send in any refills. Thanks! - AJL

## 2016-09-25 NOTE — Telephone Encounter (Signed)
Left message on voicemail for patient to call back and schedule appointment.  

## 2016-09-26 ENCOUNTER — Encounter: Payer: Self-pay | Admitting: Internal Medicine

## 2016-09-26 ENCOUNTER — Ambulatory Visit (INDEPENDENT_AMBULATORY_CARE_PROVIDER_SITE_OTHER): Payer: BLUE CROSS/BLUE SHIELD | Admitting: Internal Medicine

## 2016-09-26 VITALS — BP 101/60 | HR 61 | Temp 98.5°F | Ht 67.0 in | Wt 173.2 lb

## 2016-09-26 DIAGNOSIS — F411 Generalized anxiety disorder: Secondary | ICD-10-CM | POA: Diagnosis not present

## 2016-09-26 DIAGNOSIS — I10 Essential (primary) hypertension: Secondary | ICD-10-CM | POA: Diagnosis not present

## 2016-09-26 DIAGNOSIS — F5101 Primary insomnia: Secondary | ICD-10-CM | POA: Diagnosis not present

## 2016-09-26 MED ORDER — ZOLPIDEM TARTRATE 5 MG PO TABS: 5.0000 mg | ORAL_TABLET | Freq: Every evening | ORAL | 1 refills | Status: DC | PRN

## 2016-09-26 NOTE — Progress Notes (Signed)
   Subjective:   Patient: Ana Young       Birthdate: Oct 18, 1960       MRN: [id number]      HPI  Ana Young is a 56 y.o. female presenting for med refills.   HTN Has been taking propranolol ER 120mg  daily as prescribed. Does not check BP at home or at drugstore. Denies vision changes.   Anxiety/depression Has been taking Wellbutrin 300mg  daily as prescribed. Denies recent changes in mood. Denies anxiety or depression/feelings of sadness. Denies medication side effects. Feels medication at current dose is working well for her.   Insomnia Does not occur every night. Occurs at most 2 times per week, but sometimes does not occur at all during a week. Patient cannot identify anything that is different about days that she has trouble sleeping. Says that she has difficulty falling asleep rather than staying asleep. If someone wakes her up during the night, she is not able to fall back asleep aftewrards. She is using Ambien up to twice a week but does not use at all some weeks. Says that she watches TV in bed prior to turning off the light. Normally goes to bed between 10:30-11PM and wakes up at 6 AM. Only source of caffeine is sweet tea which she does not drink daily. If she does drink a glass it is around 5 PM. She sleeps in a dark cool room that she says is quiet. Sometimes she has back pain if she has been sitting for an extended period of time (ex on a car ride) which can make it difficult to fall asleep.   Smoking status reviewed. Patient is never smoker.   Review of Systems See HPI.     Objective:  Physical Exam  Constitutional: She is oriented to person, place, and time and well-developed, well-nourished, and in no distress.  HENT:  Head: Normocephalic and atraumatic.  Eyes: Conjunctivae and EOM are normal. Right eye exhibits no discharge. Left eye exhibits no discharge.  Pulmonary/Chest: Effort normal. No respiratory distress.  Musculoskeletal: Normal range of motion.    Neurological: She is alert and oriented to person, place, and time.  Skin: Skin is warm and dry.  Psychiatric: Affect and judgment normal.      Assessment & Plan:  Insomnia Using Ambien up to twice weekly but not at all during some week. Has asked for minimal refills since last seen for this issue in Nov 2016. Discussed sleep hygiene, primarily discontinuing watching TV in bed and turning off screens for 30 minutes before bed. Encouraged patient to continue with minimal caffeine intake and dark cool quiet room. Will refill prescription #30 tablets with one refill. Prescription printed and given to patient today.   Essential hypertension Well-controlled on Inderal. BP 120/60 in office today. No recent BMP on file, so will obtain today to monitor Cr.  - BMP - Continue Inderal 120mg  qd  Generalized anxiety disorder Mood stable and symptoms well-controlled on Wellbutrin 300mg  qd. Denies SI or HI.  - Continue Wellbutrin 300mg  qd   Adin Hector, MD, MPH PGY-2 [facility] Pager [phone]

## 2016-09-26 NOTE — Assessment & Plan Note (Signed)
Using Ambien up to twice weekly but not at all during some week. Has asked for minimal refills since last seen for this issue in Nov 2016. Discussed sleep hygiene, primarily discontinuing watching TV in bed and turning off screens for 30 minutes before bed. Encouraged patient to continue with minimal caffeine intake and dark cool quiet room. Will refill prescription #30 tablets with one refill. Prescription printed and given to patient today.

## 2016-09-26 NOTE — Assessment & Plan Note (Signed)
Mood stable and symptoms well-controlled on Wellbutrin 300mg  qd. Denies SI or HI.  - Continue Wellbutrin 300mg  qd

## 2016-09-26 NOTE — Patient Instructions (Addendum)
It was nice seeing you again today Ana Young!  To help with your difficulty sleeping, I recommend not watching TV in bed at all. We recommend not looking at any screen (TV, phone, computer, tablet, etc) for 30 minutes before bedtime, and using the bed only for sleeping.   I will let you know if there are any abnormalities with your bloodwork.   If you have any questions or concerns, please feel free to call the clinic.   Be well,  Dr. Avon Gully

## 2016-09-26 NOTE — Assessment & Plan Note (Signed)
Well-controlled on Inderal. BP 120/60 in office today. No recent BMP on file, so will obtain today to monitor Cr.  - BMP - Continue Inderal 120mg  qd

## 2016-09-27 LAB — BASIC METABOLIC PANEL
BUN / CREAT RATIO: 14 (ref 9–23)
BUN: 11 mg/dL (ref 6–24)
CO2: 27 mmol/L (ref 18–29)
Calcium: 9.3 mg/dL (ref 8.7–10.2)
Chloride: 104 mmol/L (ref 96–106)
Creatinine, Ser: 0.77 mg/dL (ref 0.57–1.00)
GFR, EST AFRICAN AMERICAN: 102 mL/min/{1.73_m2} (ref >59)
GFR, EST NON AFRICAN AMERICAN: 88 mL/min/{1.73_m2} (ref >59)
Glucose: 100 mg/dL — ABNORMAL HIGH (ref 65–99)
POTASSIUM: 4.4 mmol/L (ref 3.5–5.2)
SODIUM: 144 mmol/L (ref 134–144)

## 2016-10-17 ENCOUNTER — Ambulatory Visit (INDEPENDENT_AMBULATORY_CARE_PROVIDER_SITE_OTHER): Payer: BLUE CROSS/BLUE SHIELD | Admitting: Neurology

## 2016-10-17 ENCOUNTER — Encounter: Payer: Self-pay | Admitting: Neurology

## 2016-10-17 ENCOUNTER — Ambulatory Visit: Payer: BLUE CROSS/BLUE SHIELD | Admitting: Neurology

## 2016-10-17 VITALS — BP 110/80 | HR 82 | Ht 67.0 in | Wt 169.2 lb

## 2016-10-17 DIAGNOSIS — R29898 Other symptoms and signs involving the musculoskeletal system: Secondary | ICD-10-CM

## 2016-10-17 DIAGNOSIS — M545 Low back pain: Secondary | ICD-10-CM

## 2016-10-17 DIAGNOSIS — R202 Paresthesia of skin: Secondary | ICD-10-CM

## 2016-10-17 DIAGNOSIS — G8929 Other chronic pain: Secondary | ICD-10-CM

## 2016-10-17 MED ORDER — CYCLOBENZAPRINE HCL 5 MG PO TABS: 5.0000 mg | ORAL_TABLET | Freq: Every evening | ORAL | 3 refills | Status: DC | PRN

## 2016-10-17 MED ORDER — GABAPENTIN 300 MG PO CAPS: 300.0000 mg | ORAL_CAPSULE | Freq: Every day | ORAL | 5 refills | Status: DC
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
## 2016-10-17 NOTE — Patient Instructions (Signed)
1.  For low back pain, start flexeril 5mg  at bedtime as needed 2.  For your foot pain, start gabapentin 300mg  at bedtime everyday  Return to clinic in 3 months

## 2016-10-30 ENCOUNTER — Other Ambulatory Visit: Payer: Self-pay | Admitting: *Deleted

## 2016-10-30 DIAGNOSIS — R200 Anesthesia of skin: Secondary | ICD-10-CM

## 2016-11-04 ENCOUNTER — Telehealth: Payer: Self-pay | Admitting: *Deleted

## 2016-11-04 ENCOUNTER — Ambulatory Visit (INDEPENDENT_AMBULATORY_CARE_PROVIDER_SITE_OTHER): Payer: BLUE CROSS/BLUE SHIELD | Admitting: Neurology

## 2016-11-04 DIAGNOSIS — R200 Anesthesia of skin: Secondary | ICD-10-CM | POA: Diagnosis not present

## 2016-11-04 NOTE — Procedures (Signed)
[facility]  [address], [address]  [place], [place] [address] Tel: [phone] Fax:  [phone] Test Date:  11/04/2016  Patient: Ana Young DOB: 08-Oct-1960 Physician: Narda Amber, DO  Sex: Female Height: 5\' 7"  Ref Phys: Narda Amber, DO  ID#: [id number] Temp: 37.2C Technician:    Patient Complaints: This is a 56 year-old female referred for evaluation of right arm weakness.  NCV & EMG Findings: Extensive electrodiagnostic testing of the right upper extremity shows:  1. Right median, ulnar, radial, and mixed palmer sensory responses are within normal limits. 2. Right median and ulnar motor responses are within normal limits. 3. There is no evidence of active or chronic motor axon loss changes affecting any of the tested muscles. Motor unit configuration and recruitment pattern is within normal limits.  Impression: This is a normal study.   In particular, there is no evidence of a cervical radiculopathy, myopathy, or carpal tunnel syndrome affecting the right upper extremity.   ___________________________ Narda Amber, DO    Nerve Conduction Studies Anti Sensory Summary Table   Site NR Peak (ms) Norm Peak (ms) P-T Amp (V) Norm P-T Amp  Right Median Anti Sensory (2nd Digit)  37.2C  Wrist    2.7 <3.6 26.1 >15  Right Radial Anti Sensory (Base 1st Digit)  37.2C  Wrist    1.9 <2.7 25.1 >14  Right Ulnar Anti Sensory (5th Digit)  37.2C  Wrist    2.4 <3.1 24.5 >10   Motor Summary Table   Site NR Onset (ms) Norm Onset (ms) O-P Amp (mV) Norm O-P Amp Site1 Site2 Delta-0 (ms) Dist (cm) Vel (m/s) Norm Vel (m/s)  Right Median Motor (Abd Poll Brev)  37.2C  Wrist    2.6 <4.0 8.4 >6 Elbow Wrist 4.2 25.0 60 >50  Elbow    6.8  7.7         Right Ulnar Motor (Abd Dig Minimi)  37.2C  Wrist    1.9 <3.1 8.9 >7 B Elbow Wrist 3.2 22.5 70 >50  B Elbow    5.1  8.1  A Elbow B Elbow 1.5 10.0 67 >50  A Elbow    6.6  7.8          Comparison Summary Table   Site NR  Peak (ms) Norm Peak (ms) P-T Amp (V) Site1 Site2 Delta-P (ms) Norm Delta (ms)  Right Median/Ulnar Palm Comparison (Wrist - 8cm)  37.2C  Median Palm    1.3 <2.2 46.3 Median Palm Ulnar Palm 0.3   Ulnar Palm    1.6 <2.2 8.8       EMG   Side Muscle Ins Act Fibs Psw Fasc Number Recrt Dur Dur. Amp Amp. Poly Poly. Comment  Right 1stDorInt Nml Nml Nml Nml Nml Nml Nml Nml Nml Nml Nml Nml N/A  Right Ext Indicis Nml Nml Nml Nml Nml Nml Nml Nml Nml Nml Nml Nml N/A  Right PronatorTeres Nml Nml Nml Nml Nml Nml Nml Nml Nml Nml Nml Nml N/A  Right Biceps Nml Nml Nml Nml Nml Nml Nml Nml Nml Nml Nml Nml N/A  Right Triceps Nml Nml Nml Nml Nml Nml Nml Nml Nml Nml Nml Nml N/A  Right Deltoid Nml Nml Nml Nml Nml Nml Nml Nml Nml Nml Nml Nml N/A      Waveforms:

## 2016-11-04 NOTE — Telephone Encounter (Signed)
Patient given results and would like to try PT.  Will fax referral.

## 2016-11-04 NOTE — Telephone Encounter (Signed)
-----   Message from Alda Berthold, DO sent at 11/04/2016  2:51 PM EDT ----- Please inform patient that her nerve testing was normal. No evidence of nerve or muscle injury to explain her weakness. If she is to have these spells, recommend starting physical therapy and if no improvement with PT, MRI cervical spine the next step. Thanks.

## 2016-11-07 ENCOUNTER — Other Ambulatory Visit: Payer: Self-pay | Admitting: Internal Medicine

## 2016-11-15 ENCOUNTER — Other Ambulatory Visit: Payer: Self-pay | Admitting: Internal Medicine

## 2016-11-20 DIAGNOSIS — M25551 Pain in right hip: Secondary | ICD-10-CM | POA: Diagnosis not present

## 2016-11-20 DIAGNOSIS — M79601 Pain in right arm: Secondary | ICD-10-CM | POA: Diagnosis not present

## 2016-11-20 DIAGNOSIS — M545 Low back pain: Secondary | ICD-10-CM | POA: Diagnosis not present

## 2016-11-24 DIAGNOSIS — M25551 Pain in right hip: Secondary | ICD-10-CM | POA: Diagnosis not present

## 2016-11-24 DIAGNOSIS — M545 Low back pain: Secondary | ICD-10-CM | POA: Diagnosis not present

## 2016-11-24 DIAGNOSIS — M79601 Pain in right arm: Secondary | ICD-10-CM | POA: Diagnosis not present

## 2016-11-27 DIAGNOSIS — M545 Low back pain: Secondary | ICD-10-CM | POA: Diagnosis not present

## 2016-11-27 DIAGNOSIS — M25551 Pain in right hip: Secondary | ICD-10-CM | POA: Diagnosis not present

## 2016-11-27 DIAGNOSIS — M79601 Pain in right arm: Secondary | ICD-10-CM | POA: Diagnosis not present

## 2016-11-30 ENCOUNTER — Other Ambulatory Visit: Payer: Self-pay | Admitting: Internal Medicine

## 2016-12-02 DIAGNOSIS — M25551 Pain in right hip: Secondary | ICD-10-CM | POA: Diagnosis not present

## 2016-12-02 DIAGNOSIS — M545 Low back pain: Secondary | ICD-10-CM | POA: Diagnosis not present

## 2016-12-02 DIAGNOSIS — M79601 Pain in right arm: Secondary | ICD-10-CM | POA: Diagnosis not present

## 2016-12-04 DIAGNOSIS — M25551 Pain in right hip: Secondary | ICD-10-CM | POA: Diagnosis not present

## 2016-12-04 DIAGNOSIS — M545 Low back pain: Secondary | ICD-10-CM | POA: Diagnosis not present

## 2016-12-04 DIAGNOSIS — M79601 Pain in right arm: Secondary | ICD-10-CM | POA: Diagnosis not present

## 2016-12-08 DIAGNOSIS — M79601 Pain in right arm: Secondary | ICD-10-CM | POA: Diagnosis not present

## 2016-12-08 DIAGNOSIS — M545 Low back pain: Secondary | ICD-10-CM | POA: Diagnosis not present

## 2016-12-08 DIAGNOSIS — M25551 Pain in right hip: Secondary | ICD-10-CM | POA: Diagnosis not present

## 2016-12-12 DIAGNOSIS — M545 Low back pain: Secondary | ICD-10-CM | POA: Diagnosis not present

## 2016-12-12 DIAGNOSIS — M79601 Pain in right arm: Secondary | ICD-10-CM | POA: Diagnosis not present

## 2016-12-12 DIAGNOSIS — M25551 Pain in right hip: Secondary | ICD-10-CM | POA: Diagnosis not present

## 2016-12-16 DIAGNOSIS — M545 Low back pain: Secondary | ICD-10-CM | POA: Diagnosis not present

## 2016-12-16 DIAGNOSIS — M79601 Pain in right arm: Secondary | ICD-10-CM | POA: Diagnosis not present

## 2016-12-16 DIAGNOSIS — M25551 Pain in right hip: Secondary | ICD-10-CM | POA: Diagnosis not present

## 2016-12-18 DIAGNOSIS — M79601 Pain in right arm: Secondary | ICD-10-CM | POA: Diagnosis not present

## 2016-12-18 DIAGNOSIS — M25551 Pain in right hip: Secondary | ICD-10-CM | POA: Diagnosis not present

## 2016-12-18 DIAGNOSIS — M545 Low back pain: Secondary | ICD-10-CM | POA: Diagnosis not present

## 2016-12-22 DIAGNOSIS — M545 Low back pain: Secondary | ICD-10-CM | POA: Diagnosis not present

## 2016-12-22 DIAGNOSIS — M79601 Pain in right arm: Secondary | ICD-10-CM | POA: Diagnosis not present

## 2016-12-22 DIAGNOSIS — M25551 Pain in right hip: Secondary | ICD-10-CM | POA: Diagnosis not present

## 2016-12-26 DIAGNOSIS — M545 Low back pain: Secondary | ICD-10-CM | POA: Diagnosis not present

## 2016-12-26 DIAGNOSIS — M79601 Pain in right arm: Secondary | ICD-10-CM | POA: Diagnosis not present

## 2016-12-26 DIAGNOSIS — M25551 Pain in right hip: Secondary | ICD-10-CM | POA: Diagnosis not present

## 2016-12-29 ENCOUNTER — Other Ambulatory Visit: Payer: Self-pay | Admitting: Nurse Practitioner

## 2017-01-01 DIAGNOSIS — M79601 Pain in right arm: Secondary | ICD-10-CM | POA: Diagnosis not present

## 2017-01-01 DIAGNOSIS — M25551 Pain in right hip: Secondary | ICD-10-CM | POA: Diagnosis not present

## 2017-01-01 DIAGNOSIS — M545 Low back pain: Secondary | ICD-10-CM | POA: Diagnosis not present

## 2017-01-10 ENCOUNTER — Other Ambulatory Visit: Payer: Self-pay | Admitting: Internal Medicine

## 2017-02-15 ENCOUNTER — Other Ambulatory Visit: Payer: Self-pay | Admitting: Neurology

## 2017-02-25 ENCOUNTER — Other Ambulatory Visit: Payer: Self-pay | Admitting: Internal Medicine

## 2017-03-07 ENCOUNTER — Other Ambulatory Visit: Payer: Self-pay | Admitting: Nurse Practitioner

## 2017-03-09 ENCOUNTER — Other Ambulatory Visit: Payer: Self-pay | Admitting: Neurology

## 2017-03-15 ENCOUNTER — Other Ambulatory Visit: Payer: Self-pay | Admitting: Internal Medicine

## 2017-03-22 ENCOUNTER — Other Ambulatory Visit: Payer: Self-pay | Admitting: Internal Medicine

## 2017-04-01 DIAGNOSIS — Z029 Encounter for administrative examinations, unspecified: Secondary | ICD-10-CM

## 2017-04-14 ENCOUNTER — Other Ambulatory Visit: Payer: Self-pay | Admitting: Neurology

## 2017-05-07 ENCOUNTER — Other Ambulatory Visit: Payer: Self-pay | Admitting: Internal Medicine

## 2017-05-07 DIAGNOSIS — Z853 Personal history of malignant neoplasm of breast: Secondary | ICD-10-CM

## 2017-05-13 ENCOUNTER — Ambulatory Visit
Admission: RE | Admit: 2017-05-13 | Discharge: 2017-05-13 | Disposition: A | Discharge status: Home / Self Care | Payer: BLUE CROSS/BLUE SHIELD | Source: Ambulatory Visit | Attending: Family Medicine | Admitting: Family Medicine

## 2017-05-13 DIAGNOSIS — Z853 Personal history of malignant neoplasm of breast: Secondary | ICD-10-CM

## 2017-05-13 DIAGNOSIS — R922 Inconclusive mammogram: Secondary | ICD-10-CM | POA: Diagnosis not present

## 2017-05-13 HISTORY — DX: Personal history of antineoplastic chemotherapy: Z92.21

## 2017-05-13 HISTORY — DX: Personal history of irradiation: Z92.3

## 2017-05-22 ENCOUNTER — Other Ambulatory Visit: Payer: Self-pay | Admitting: Internal Medicine

## 2017-06-09 ENCOUNTER — Other Ambulatory Visit: Payer: Self-pay | Admitting: Internal Medicine

## 2017-07-07 ENCOUNTER — Other Ambulatory Visit: Payer: Self-pay | Admitting: Internal Medicine

## 2017-07-08 ENCOUNTER — Other Ambulatory Visit: Payer: Self-pay | Admitting: Neurology

## 2017-07-08 NOTE — Telephone Encounter (Signed)
Rx filled with 3 refills and note stating that she needs a follow up appointment.

## 2017-07-20 ENCOUNTER — Other Ambulatory Visit: Payer: Self-pay | Admitting: Internal Medicine

## 2017-07-20 ENCOUNTER — Telehealth: Payer: Self-pay | Admitting: Internal Medicine

## 2017-07-20 ENCOUNTER — Telehealth: Payer: Self-pay

## 2017-07-20 MED ORDER — IBUPROFEN 800 MG PO TABS: 800.0000 mg | ORAL_TABLET | Freq: Three times a day (TID) | ORAL | 0 refills | Status: DC | PRN

## 2017-07-20 NOTE — Telephone Encounter (Signed)
Called patient to inform her that a one month supply of ibuprofen had been refilled for her and that she needed to make an appointment per Dr. Avon Gully.Patient stated that she does not need it and she does not need an appointment to come in either. Says she is fine. I told her that if her status changed that she could give [place] a call back.Ozella Almond

## 2017-07-20 NOTE — Telephone Encounter (Signed)
Received refill request for ibuprofen 800mg . Patient consistently requesting refills for this. Will send in one additional refill for one month, however if she is having this much pain daily, she needs to schedule an appt to discuss this. Will not write additional refills until patient is seen in office.   Adin Hector, MD, MPH PGY-3 [facility] Pager [phone]

## 2017-07-24 ENCOUNTER — Other Ambulatory Visit: Payer: Self-pay | Admitting: *Deleted

## 2017-08-05 ENCOUNTER — Other Ambulatory Visit: Payer: Self-pay | Admitting: *Deleted

## 2017-08-07 ENCOUNTER — Other Ambulatory Visit: Payer: Self-pay

## 2017-08-10 MED ORDER — BUPROPION HCL ER (XL) 300 MG PO TB24: 300.0000 mg | ORAL_TABLET | Freq: Every day | ORAL | 3 refills | Status: DC

## 2017-10-14 ENCOUNTER — Other Ambulatory Visit: Payer: Self-pay | Admitting: Neurology

## 2017-10-27 DIAGNOSIS — H2511 Age-related nuclear cataract, right eye: Secondary | ICD-10-CM | POA: Diagnosis not present

## 2017-10-27 DIAGNOSIS — H40013 Open angle with borderline findings, low risk, bilateral: Secondary | ICD-10-CM | POA: Diagnosis not present

## 2017-10-27 DIAGNOSIS — H2512 Age-related nuclear cataract, left eye: Secondary | ICD-10-CM | POA: Diagnosis not present

## 2017-10-27 DIAGNOSIS — H52203 Unspecified astigmatism, bilateral: Secondary | ICD-10-CM | POA: Diagnosis not present

## 2017-10-27 DIAGNOSIS — H25041 Posterior subcapsular polar age-related cataract, right eye: Secondary | ICD-10-CM | POA: Diagnosis not present

## 2017-10-27 DIAGNOSIS — H5213 Myopia, bilateral: Secondary | ICD-10-CM | POA: Diagnosis not present

## 2017-10-27 DIAGNOSIS — H524 Presbyopia: Secondary | ICD-10-CM | POA: Diagnosis not present

## 2017-11-02 DIAGNOSIS — C50412 Malignant neoplasm of upper-outer quadrant of left female breast: Secondary | ICD-10-CM | POA: Diagnosis not present

## 2017-11-02 DIAGNOSIS — M25511 Pain in right shoulder: Secondary | ICD-10-CM | POA: Diagnosis not present

## 2017-11-10 ENCOUNTER — Other Ambulatory Visit: Payer: Self-pay | Admitting: Neurology

## 2017-12-02 DIAGNOSIS — H2511 Age-related nuclear cataract, right eye: Secondary | ICD-10-CM | POA: Diagnosis not present

## 2017-12-02 DIAGNOSIS — H25041 Posterior subcapsular polar age-related cataract, right eye: Secondary | ICD-10-CM | POA: Diagnosis not present

## 2017-12-02 DIAGNOSIS — H2512 Age-related nuclear cataract, left eye: Secondary | ICD-10-CM | POA: Diagnosis not present

## 2017-12-18 DIAGNOSIS — H2512 Age-related nuclear cataract, left eye: Secondary | ICD-10-CM | POA: Diagnosis not present

## 2018-01-01 DIAGNOSIS — C50919 Malignant neoplasm of unspecified site of unspecified female breast: Secondary | ICD-10-CM | POA: Diagnosis not present

## 2018-02-18 ENCOUNTER — Other Ambulatory Visit: Payer: Self-pay | Admitting: Neurology

## 2018-03-02 ENCOUNTER — Other Ambulatory Visit: Payer: Self-pay | Admitting: Neurology

## 2018-03-06 ENCOUNTER — Other Ambulatory Visit: Payer: Self-pay | Admitting: Neurology

## 2018-03-19 ENCOUNTER — Other Ambulatory Visit: Payer: Self-pay | Admitting: Internal Medicine

## 2018-03-28 ENCOUNTER — Other Ambulatory Visit: Payer: Self-pay | Admitting: Neurology

## 2018-03-29 ENCOUNTER — Other Ambulatory Visit: Payer: Self-pay

## 2018-03-29 NOTE — Telephone Encounter (Signed)
Ana Young is calling to have her Flexeril refilled. Ottis Stain, CMA

## 2018-03-30 MED ORDER — CYCLOBENZAPRINE HCL 5 MG PO TABS: 5.0000 mg | ORAL_TABLET | Freq: Every evening | ORAL | 3 refills | Status: DC | PRN

## 2018-04-30 ENCOUNTER — Encounter: Payer: Self-pay | Admitting: Family Medicine

## 2018-04-30 ENCOUNTER — Ambulatory Visit: Payer: BLUE CROSS/BLUE SHIELD | Admitting: Family Medicine

## 2018-04-30 ENCOUNTER — Other Ambulatory Visit: Payer: Self-pay

## 2018-04-30 VITALS — BP 118/80 | HR 66 | Temp 98.3°F | Ht 67.0 in | Wt 178.2 lb

## 2018-04-30 DIAGNOSIS — I10 Essential (primary) hypertension: Secondary | ICD-10-CM | POA: Diagnosis not present

## 2018-04-30 DIAGNOSIS — F3289 Other specified depressive episodes: Secondary | ICD-10-CM | POA: Diagnosis not present

## 2018-04-30 DIAGNOSIS — Z0000 Encounter for general adult medical examination without abnormal findings: Secondary | ICD-10-CM

## 2018-04-30 DIAGNOSIS — F411 Generalized anxiety disorder: Secondary | ICD-10-CM

## 2018-04-30 MED ORDER — BUPROPION HCL ER (XL) 300 MG PO TB24: 300.0000 mg | ORAL_TABLET | Freq: Every day | ORAL | 3 refills | Status: DC

## 2018-04-30 MED ORDER — PROPRANOLOL HCL ER 120 MG PO CP24: ORAL_CAPSULE | ORAL | 3 refills | Status: DC

## 2018-04-30 MED ORDER — HYDROXYZINE HCL 10 MG PO TABS: 10.0000 mg | ORAL_TABLET | Freq: Every day | ORAL | 0 refills | Status: DC

## 2018-04-30 MED ORDER — SERTRALINE HCL 50 MG PO TABS: 50.0000 mg | ORAL_TABLET | Freq: Every day | ORAL | 3 refills | Status: DC

## 2018-04-30 MED ORDER — GABAPENTIN 300 MG PO CAPS: ORAL_CAPSULE | ORAL | 3 refills | Status: DC

## 2018-04-30 NOTE — Patient Instructions (Signed)
It was great to meet you today! Thank you for letting me participate in your care!  Today, we discussed your medications. I have given you a new medication to help with your anxiety and depression called Zoloft. Please take it as prescribed. I have also given you a medication called Atarax to take only as needed at night to help you sleep.   I have refilled Wellbutrin, Gabapentin, and Propanolol. I will see you in two weeks for follow up.  Be well, Harolyn Rutherford, DO PGY-2, [facility]

## 2018-04-30 NOTE — Assessment & Plan Note (Signed)
Stable. Patient is taking medication and PHQ-9 score indicates it is well controlled.  - Cont Wellbutrin

## 2018-04-30 NOTE — Assessment & Plan Note (Signed)
Well controlled today. Has been over one year since we have checked blood work so will check today. - Continue Propanolol 120mg  daily

## 2018-04-30 NOTE — Progress Notes (Signed)
Subjective: Chief Complaint  Patient presents with  . Medication Refill     HPI: Ana Young is a 57 y.o. presenting to clinic today to discuss the following:  Depression/Anxiety Patient expresses she is doing "ok" when asked if she feels her anxiety and depression are well controlled on Wellbutrin. She states she has "been on it a long time" and has tried other medications in the past but cannot recall what they were. She denies any harmful intent to herself or others and is denies any suicidal ideation. She is interested in trying another medication as she feels her symptoms are poorly controlled due to recent interactions at work with an employee. 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 PHQ-9 score of 5 GAD-7 score of 18  Med Refills Refills for gabapentin for neuropathic pain following Chemotherapy for Breast Cancer. Refill on Propanolol for HTN. Stopped Ambien after discussing risks/benefits with patient.    ROS noted in HPI.   Past Medical, Surgical, Social, and Family History Reviewed & Updated per EMR.   Pertinent Historical Findings include:   Social History   Tobacco Use  Smoking Status Never Smoker  Smokeless Tobacco Never Used    Objective: BP 118/80   Pulse 66   Temp 98.3 F (36.8 C) (Oral)   Ht 5\' 7"  (1.702 m)   Wt 178 lb 3.2 oz (80.8 kg)   SpO2 99%   BMI 27.91 kg/m  Vitals and nursing notes reviewed  Physical Exam Gen: Alert and Oriented x 3, NAD HEENT: Normocephalic, atraumatic, PERRLA, EOMI CV: RRR, no murmurs, normal S1, S2 split Resp: CTAB, no wheezing, rales, or rhonchi, comfortable work of breathing Ext: no clubbing, cyanosis, or edema Skin: warm, dry, intact, no rashes  No results found for this or any previous visit (from the past 72 hour(s)).  Assessment/Plan:  Essential hypertension Well controlled today. Has been over one year since we have checked blood work so will check today. - Continue Propanolol  120mg  daily  Depression Stable. Patient is taking medication and PHQ-9 score indicates it is well controlled.  - Cont Wellbutrin  Generalized anxiety disorder GAD-7 score of 18 today indicates not well controlled. Added Sertraline 50mg  today to help reduce anxiety although I suspect some of this is from a difficult work situation that was just recently resolved. I will see her back in 2 weeks and titrate up if needed. - Start Sertraline 50mg  daily - Start Atarax 10mg  at night as needed - Cont Wellbutrin 300mg  daily   PATIENT EDUCATION PROVIDED: See AVS    Diagnosis and plan along with any newly prescribed medication(s) were discussed in detail with this patient today. The patient verbalized understanding and agreed with the plan. Patient advised if symptoms worsen return to clinic or ER.   Health Maintainance:   Orders Placed This Encounter  Procedures  . CBC with Differential  . CMP and Liver  . HIV antibody (with reflex)    Meds ordered this encounter  Medications  . sertraline (ZOLOFT) 50 MG tablet    Sig: Take 1 tablet (50 mg total) by mouth daily.    Dispense:  30 tablet    Refill:  3  . buPROPion (WELLBUTRIN XL) 300 MG 24 hr tablet    Sig: Take 1 tablet (300 mg total) by mouth daily.    Dispense:  30 tablet    Refill:  3  . gabapentin (NEURONTIN) 300 MG capsule    Sig: TAKE  1 CAPSULE BY MOUTH EVERYDAY AT BEDTIME    Dispense:  30 capsule    Refill:  3    Patient needs an appointment.  . propranolol ER (INDERAL LA) 120 MG 24 hr capsule    Sig: TAKE 1 CAPSULE BY MOUTH EVERY DAY    Dispense:  90 capsule    Refill:  3  . hydrOXYzine (ATARAX/VISTARIL) 10 MG tablet    Sig: Take 1 tablet (10 mg total) by mouth at bedtime.    Dispense:  30 tablet    Refill:  0    Harolyn Rutherford, DO 04/30/2018, 8:58 AM PGY-2 [place]

## 2018-04-30 NOTE — Assessment & Plan Note (Signed)
GAD-7 score of 18 today indicates not well controlled. Added Sertraline 50mg  today to help reduce anxiety although I suspect some of this is from a difficult work situation that was just recently resolved. I will see her back in 2 weeks and titrate up if needed. - Start Sertraline 50mg  daily - Start Atarax 10mg  at night as needed - Cont Wellbutrin 300mg  daily

## 2018-05-01 LAB — CBC WITH DIFFERENTIAL/PLATELET
BASOS ABS: 0 10*3/uL (ref 0.0–0.2)
Basos: 1 %
EOS (ABSOLUTE): 0.1 10*3/uL (ref 0.0–0.4)
Eos: 1 %
Hematocrit: 39 % (ref 34.0–46.6)
Hemoglobin: 12.8 g/dL (ref 11.1–15.9)
IMMATURE GRANS (ABS): 0 10*3/uL (ref 0.0–0.1)
Immature Granulocytes: 0 %
LYMPHS: 40 %
Lymphocytes Absolute: 1.6 10*3/uL (ref 0.7–3.1)
MCH: 27.3 pg (ref 26.6–33.0)
MCHC: 32.8 g/dL (ref 31.5–35.7)
MCV: 83 fL (ref 79–97)
MONOCYTES: 9 %
Monocytes Absolute: 0.4 10*3/uL (ref 0.1–0.9)
NEUTROS ABS: 1.9 10*3/uL (ref 1.4–7.0)
Neutrophils: 49 %
PLATELETS: 300 10*3/uL (ref 150–450)
RBC: 4.69 x10E6/uL (ref 3.77–5.28)
RDW: 13.3 % (ref 12.3–15.4)
WBC: 3.9 10*3/uL (ref 3.4–10.8)

## 2018-05-01 LAB — CMP AND LIVER
ALBUMIN: 4.1 g/dL (ref 3.5–5.5)
ALT: 14 IU/L (ref 0–32)
AST: 16 IU/L (ref 0–40)
Alkaline Phosphatase: 115 IU/L (ref 39–117)
BUN: 13 mg/dL (ref 6–24)
Bilirubin Total: <0.2 mg/dL (ref 0.0–1.2)
Bilirubin, Direct: 0.08 mg/dL (ref 0.00–0.40)
CO2: 25 mmol/L (ref 20–29)
CREATININE: 0.83 mg/dL (ref 0.57–1.00)
Calcium: 9.7 mg/dL (ref 8.7–10.2)
Chloride: 102 mmol/L (ref 96–106)
GFR calc Af Amer: 92 mL/min/{1.73_m2} (ref >59)
GFR, EST NON AFRICAN AMERICAN: 80 mL/min/{1.73_m2} (ref >59)
GLUCOSE: 91 mg/dL (ref 65–99)
POTASSIUM: 4.4 mmol/L (ref 3.5–5.2)
Sodium: 142 mmol/L (ref 134–144)
Total Protein: 7.1 g/dL (ref 6.0–8.5)

## 2018-05-01 LAB — HIV ANTIBODY (ROUTINE TESTING W REFLEX): HIV Screen 4th Generation wRfx: NONREACTIVE

## 2018-05-03 ENCOUNTER — Other Ambulatory Visit: Payer: Self-pay | Admitting: Family Medicine

## 2018-05-03 DIAGNOSIS — Z853 Personal history of malignant neoplasm of breast: Secondary | ICD-10-CM

## 2018-05-04 ENCOUNTER — Encounter: Payer: Self-pay | Admitting: Family Medicine

## 2018-05-04 DIAGNOSIS — Z961 Presence of intraocular lens: Secondary | ICD-10-CM | POA: Diagnosis not present

## 2018-05-04 NOTE — Progress Notes (Signed)
Normal labs, sending letter to patient

## 2018-05-07 ENCOUNTER — Ambulatory Visit: Payer: BLUE CROSS/BLUE SHIELD | Admitting: Family Medicine

## 2018-05-17 ENCOUNTER — Ambulatory Visit
Admission: RE | Admit: 2018-05-17 | Discharge: 2018-05-17 | Disposition: A | Discharge status: Home / Self Care | Payer: BLUE CROSS/BLUE SHIELD | Source: Ambulatory Visit | Attending: Family Medicine | Admitting: Family Medicine

## 2018-05-17 ENCOUNTER — Other Ambulatory Visit: Payer: Self-pay | Admitting: Family Medicine

## 2018-05-17 DIAGNOSIS — N6310 Unspecified lump in the right breast, unspecified quadrant: Secondary | ICD-10-CM

## 2018-05-17 DIAGNOSIS — Z853 Personal history of malignant neoplasm of breast: Secondary | ICD-10-CM

## 2018-05-17 DIAGNOSIS — N6314 Unspecified lump in the right breast, lower inner quadrant: Secondary | ICD-10-CM | POA: Diagnosis not present

## 2018-05-17 DIAGNOSIS — N6312 Unspecified lump in the right breast, upper inner quadrant: Secondary | ICD-10-CM | POA: Diagnosis not present

## 2018-05-17 DIAGNOSIS — R922 Inconclusive mammogram: Secondary | ICD-10-CM | POA: Diagnosis not present

## 2018-05-18 ENCOUNTER — Encounter: Payer: Self-pay | Admitting: Family Medicine

## 2018-05-18 ENCOUNTER — Ambulatory Visit (INDEPENDENT_AMBULATORY_CARE_PROVIDER_SITE_OTHER): Payer: BLUE CROSS/BLUE SHIELD | Admitting: Family Medicine

## 2018-05-18 VITALS — BP 120/70 | HR 74 | Temp 98.4°F | Wt 179.6 lb

## 2018-05-18 DIAGNOSIS — N63 Unspecified lump in unspecified breast: Secondary | ICD-10-CM

## 2018-05-18 DIAGNOSIS — F411 Generalized anxiety disorder: Secondary | ICD-10-CM | POA: Diagnosis not present

## 2018-05-18 DIAGNOSIS — Z853 Personal history of malignant neoplasm of breast: Secondary | ICD-10-CM | POA: Diagnosis not present

## 2018-05-18 MED ORDER — SERTRALINE HCL 50 MG PO TABS: 50.0000 mg | ORAL_TABLET | Freq: Every day | ORAL | 1 refills | Status: DC

## 2018-05-18 NOTE — Assessment & Plan Note (Signed)
Improved. Patient stable on Sertraline 50mg  daily - Continue Sertraline 50mg  daily

## 2018-05-18 NOTE — Assessment & Plan Note (Signed)
Suspicious nodule found on mammogram and then with subsequent ultrasound of the breast. - Biopsy scheduled for 05/20/2018 - Will follow results

## 2018-05-18 NOTE — Patient Instructions (Signed)
It was great to see you today! Thank you for letting me participate in your care!  Today, we discussed your anxiety and I am so pleased that you are feeling better and responding to Sertraline. Please continue to take this once a day every day and call me if you have any side effects.  I will await the results of the breat biopsy and will follow along with you and you have my prayers that the results will be normal. If you need anything please do not hesitate to call me.  Be well, Harolyn Rutherford, DO PGY-2, [facility]

## 2018-05-18 NOTE — Progress Notes (Addendum)
     Subjective: Chief Complaint  Patient presents with  . Anxiety   HPI: Ana Young is a 57 y.o. presenting to clinic today to discuss the following:  Follow up for Anxiety At last appointment patient was seen for anxiety and had a GAD-7 score of 18. Today her mood is much improved and her GAD-7 score was 6. She was started on Sertraline 50mg  daily and she states since then she has felt much better. She attributes part of her improvement based on that fact that her job was stressful and she is no longer in the stressful situation. She also feels like Sertraline has helped her and she has been taking it as prescribed. She denies any side effects and has no suicidal or harmful ideation toward her self or others at this time.  Abnormal Mammogram Patient had suspicious lesion finding on screening mammogram that was further defined with breast ultrasound. She is having an ultrasound guided biopsy this Thursday and I will follow up with her once I have a copy of the results.  Health Maintenance: declined flu shot, pap at next visit     ROS noted in HPI.   Past Medical, Surgical, Social, and Family History Reviewed & Updated per EMR.   Pertinent Historical Findings include:   Social History   Tobacco Use  Smoking Status Never Smoker  Smokeless Tobacco Never Used   Objective: BP 120/70   Pulse 74   Temp 98.4 F (36.9 C)   Wt 179 lb 9.6 oz (81.5 kg)   SpO2 97%   BMI 28.13 kg/m  Vitals and nursing notes reviewed  Physical Exam Gen: Alert and Oriented x 3, NAD HEENT: Normocephalic, atraumatic CV: RRR, no murmurs, normal S1, S2 split Resp: CTAB, no wheezing, rales, or rhonchi, comfortable work of breathing Ext: no clubbing, cyanosis, or edema Neuro: No gross deficits Skin: warm, dry, intact, no rashes Psych: normal mood and affect, normal behavior and thought content, pleasant and calm  No results found for this or any previous visit (from the past 72  hour(s)).  Assessment/Plan:  Generalized anxiety disorder Improved. Patient stable on Sertraline 50mg  daily - Continue Sertraline 50mg  daily   Hx of breast cancer Suspicious nodule found on mammogram and then with subsequent ultrasound of the breast. - Biopsy scheduled for 05/20/2018 - Will follow results   PATIENT EDUCATION PROVIDED: See AVS    Diagnosis and plan along with any newly prescribed medication(s) were discussed in detail with this patient today. The patient verbalized understanding and agreed with the plan. Patient advised if symptoms worsen return to clinic or ER.   Health Maintainance:   No orders of the defined types were placed in this encounter.   Meds ordered this encounter  Medications  . sertraline (ZOLOFT) 50 MG tablet    Sig: Take 1 tablet (50 mg total) by mouth daily.    Dispense:  90 tablet    Refill:  Parrott, DO 05/18/2018, 11:14 AM PGY-2 [place]

## 2018-05-20 ENCOUNTER — Ambulatory Visit
Admission: RE | Admit: 2018-05-20 | Discharge: 2018-05-20 | Disposition: A | Discharge status: Home / Self Care | Payer: BLUE CROSS/BLUE SHIELD | Source: Ambulatory Visit | Attending: Family Medicine | Admitting: Family Medicine

## 2018-05-20 ENCOUNTER — Other Ambulatory Visit: Payer: Self-pay | Admitting: Family Medicine

## 2018-05-20 DIAGNOSIS — Z853 Personal history of malignant neoplasm of breast: Secondary | ICD-10-CM

## 2018-05-20 DIAGNOSIS — L905 Scar conditions and fibrosis of skin: Secondary | ICD-10-CM | POA: Diagnosis not present

## 2018-05-20 DIAGNOSIS — D241 Benign neoplasm of right breast: Secondary | ICD-10-CM | POA: Diagnosis not present

## 2018-05-20 DIAGNOSIS — N6310 Unspecified lump in the right breast, unspecified quadrant: Secondary | ICD-10-CM

## 2018-05-20 DIAGNOSIS — N6315 Unspecified lump in the right breast, overlapping quadrants: Secondary | ICD-10-CM | POA: Diagnosis not present

## 2018-05-20 DIAGNOSIS — N6312 Unspecified lump in the right breast, upper inner quadrant: Secondary | ICD-10-CM | POA: Diagnosis not present

## 2018-05-25 ENCOUNTER — Other Ambulatory Visit: Payer: Self-pay | Admitting: Family Medicine

## 2018-06-18 ENCOUNTER — Other Ambulatory Visit: Payer: Self-pay | Admitting: Family Medicine

## 2018-08-19 ENCOUNTER — Other Ambulatory Visit: Payer: Self-pay | Admitting: Family Medicine

## 2018-09-10 ENCOUNTER — Other Ambulatory Visit: Payer: Self-pay | Admitting: Family Medicine

## 2018-09-22 ENCOUNTER — Telehealth: Payer: Self-pay

## 2018-09-22 NOTE — Telephone Encounter (Signed)
Patient would like to start HCTZ again. Please call her at [phone].  Danley Danker, RN [facility] RN)

## 2018-09-27 ENCOUNTER — Other Ambulatory Visit: Payer: Self-pay | Admitting: Family Medicine

## 2018-09-27 DIAGNOSIS — I10 Essential (primary) hypertension: Secondary | ICD-10-CM

## 2018-09-27 MED ORDER — HYDROCHLOROTHIAZIDE 25 MG PO TABS: 25.0000 mg | ORAL_TABLET | Freq: Every day | ORAL | 3 refills | Status: DC

## 2018-09-27 NOTE — Progress Notes (Signed)
Patient states she would like to restart a fluid pill as she has "gained weight" and BP is elevated. She denies any LE edema and has not had any SOB.  Sending prescription for HCTZ to her pharmacy and patient will come to clinic in am for labs.

## 2018-09-28 ENCOUNTER — Other Ambulatory Visit: Payer: Self-pay

## 2018-09-28 ENCOUNTER — Other Ambulatory Visit: Payer: BLUE CROSS/BLUE SHIELD

## 2018-09-28 DIAGNOSIS — I10 Essential (primary) hypertension: Secondary | ICD-10-CM

## 2018-09-29 LAB — BASIC METABOLIC PANEL
BUN/Creatinine Ratio: 18 (ref 9–23)
BUN: 17 mg/dL (ref 6–24)
CO2: 27 mmol/L (ref 20–29)
CREATININE: 0.96 mg/dL (ref 0.57–1.00)
Calcium: 9.6 mg/dL (ref 8.7–10.2)
Chloride: 94 mmol/L — ABNORMAL LOW (ref 96–106)
GFR calc Af Amer: 77 mL/min/{1.73_m2} (ref >59)
GFR calc non Af Amer: 67 mL/min/{1.73_m2} (ref >59)
GLUCOSE: 108 mg/dL — AB (ref 65–99)
Potassium: 3.1 mmol/L — ABNORMAL LOW (ref 3.5–5.2)
Sodium: 139 mmol/L (ref 134–144)

## 2018-09-30 ENCOUNTER — Other Ambulatory Visit: Payer: Self-pay | Admitting: Family Medicine

## 2018-09-30 DIAGNOSIS — I10 Essential (primary) hypertension: Secondary | ICD-10-CM

## 2018-09-30 NOTE — Progress Notes (Signed)
Called patient b/c last Potassium is 3.1 and she was started on HCTZ. Informed patient to return to clinic in two weeks to get BMP.

## 2018-10-10 ENCOUNTER — Other Ambulatory Visit: Payer: Self-pay | Admitting: Family Medicine

## 2018-11-07 ENCOUNTER — Other Ambulatory Visit: Payer: Self-pay | Admitting: Family Medicine

## 2018-12-22 ENCOUNTER — Other Ambulatory Visit: Payer: Self-pay | Admitting: Family Medicine

## 2019-01-14 ENCOUNTER — Telehealth (INDEPENDENT_AMBULATORY_CARE_PROVIDER_SITE_OTHER): Payer: Self-pay | Admitting: Family Medicine

## 2019-01-14 ENCOUNTER — Other Ambulatory Visit: Payer: Self-pay

## 2019-01-14 DIAGNOSIS — R509 Fever, unspecified: Secondary | ICD-10-CM

## 2019-01-14 NOTE — Progress Notes (Signed)
[place] Telemedicine Visit  Patient consented to have virtual visit. Method of visit: Telephone  Encounter participants: Patient: Shamiya Demeritt - located at home Provider: Daisy Floro - located at clinic Others (if applicable): none  Chief Complaint: fever  HPI: The patient reports she has been having a headache for 2 days now that was then follow by chills. Out of curiosity she took her temperature at home and it read 102*F yesterday, and today 100*F. She has also been feeling more tired than usual. Otherwise she denies shortness of breath, cough, nausea, vomiting, weight loss, or myalgias. Has been taking ibuprofen for symptom relief.  She denies sick contacts, has been to [employer] for groceries, she was with her family outside on July 4th.  ROS: per HPI  Pertinent PMHx: history of breast cancer treated with chemotherapy and radiation therapy  Exam:  Respiratory: normal work   Assessment/Plan: Fever and chills -Tylenol 500mg  with 400mg  Ibuprofen as needed every 6 hours -Stay away from other people -Will order COVID testing -Patient told to contact clinic or go to ED should her symptoms worsen (fever not responding to medications, shortness of breath, body aches/pains, etc).     Time spent during visit with patient: 16:00 minutes  Milus Banister, [place], PGY-2 01/14/2019 4:15 PM

## 2019-01-14 NOTE — Assessment & Plan Note (Signed)
-  Tylenol 500mg  with 400mg  Ibuprofen as needed every 6 hours -Stay away from other people -Will order COVID testing -Patient told to contact clinic or go to ED should her symptoms worsen (fever not responding to medications, shortness of breath, body aches/pains, etc).

## 2019-01-16 ENCOUNTER — Telehealth: Payer: Self-pay

## 2019-01-16 DIAGNOSIS — Z20822 Contact with and (suspected) exposure to covid-19: Secondary | ICD-10-CM

## 2019-01-16 NOTE — Telephone Encounter (Signed)
-----   Message from Daisy Floro, DO sent at 01/14/2019  5:39 PM EDT ----- Regarding: COVID TESTING Please call patient and schedule for COVID Testing, has fever and chills. Thanks!  Milus Banister, [place], PGY-2 01/14/2019 5:40 PM

## 2019-01-16 NOTE — Telephone Encounter (Signed)
Called pt and LM on VM to call back to schedule Covid testing.

## 2019-01-16 NOTE — Addendum Note (Signed)
Addended by: Carlisle Beers on: 01/16/2019 05:28 PM   Modules accepted: Orders

## 2019-01-16 NOTE — Telephone Encounter (Signed)
Pt scheduled for 12:45 pm at [facility] 01/17/19. Pt advised of address, to wear mask to testing site and to stay in car for testing.

## 2019-01-17 ENCOUNTER — Other Ambulatory Visit: Payer: Self-pay

## 2019-01-17 DIAGNOSIS — Z20822 Contact with and (suspected) exposure to covid-19: Secondary | ICD-10-CM

## 2019-01-21 LAB — NOVEL CORONAVIRUS, NAA: SARS-CoV-2, NAA: NOT DETECTED

## 2019-02-17 ENCOUNTER — Other Ambulatory Visit: Payer: Self-pay

## 2019-02-19 MED ORDER — HYDROXYZINE HCL 10 MG PO TABS: ORAL_TABLET | ORAL | 0 refills | Status: DC

## 2019-03-07 ENCOUNTER — Other Ambulatory Visit: Payer: Self-pay | Admitting: Family Medicine

## 2019-03-11 ENCOUNTER — Other Ambulatory Visit: Payer: Self-pay

## 2019-03-11 MED ORDER — HYDROXYZINE HCL 10 MG PO TABS: ORAL_TABLET | ORAL | 0 refills | Status: DC

## 2019-03-16 ENCOUNTER — Other Ambulatory Visit: Payer: Self-pay

## 2019-03-16 MED ORDER — SERTRALINE HCL 50 MG PO TABS: 50.0000 mg | ORAL_TABLET | Freq: Every day | ORAL | 1 refills | Status: DC

## 2019-03-16 MED ORDER — BUPROPION HCL ER (XL) 300 MG PO TB24: 300.0000 mg | ORAL_TABLET | Freq: Every day | ORAL | 3 refills | Status: DC

## 2019-03-16 MED ORDER — GABAPENTIN 300 MG PO CAPS: ORAL_CAPSULE | ORAL | 3 refills | Status: DC

## 2019-06-14 ENCOUNTER — Telehealth: Payer: Self-pay

## 2019-06-14 NOTE — Telephone Encounter (Signed)
Pt calling to find out what she can do to help with tingling in her toes and tips of her fingers. Also, her feet are ice cold. She is taking Gabapentin but that is it. Any ideas on what she can do to help this?  Please call [phone]. Ottis Stain, CMA

## 2019-06-17 ENCOUNTER — Other Ambulatory Visit: Payer: Self-pay | Admitting: Family Medicine

## 2019-07-18 ENCOUNTER — Encounter: Payer: Self-pay | Admitting: Family Medicine

## 2019-07-18 ENCOUNTER — Other Ambulatory Visit: Payer: Self-pay | Admitting: Family Medicine

## 2019-07-18 ENCOUNTER — Other Ambulatory Visit: Payer: Self-pay

## 2019-07-18 ENCOUNTER — Ambulatory Visit (INDEPENDENT_AMBULATORY_CARE_PROVIDER_SITE_OTHER): Payer: BC Managed Care – PPO | Admitting: Family Medicine

## 2019-07-18 VITALS — BP 101/70 | HR 71 | Wt 176.2 lb

## 2019-07-18 DIAGNOSIS — G629 Polyneuropathy, unspecified: Secondary | ICD-10-CM

## 2019-07-18 DIAGNOSIS — E1122 Type 2 diabetes mellitus with diabetic chronic kidney disease: Secondary | ICD-10-CM

## 2019-07-18 DIAGNOSIS — G6289 Other specified polyneuropathies: Secondary | ICD-10-CM

## 2019-07-18 HISTORY — DX: Type 2 diabetes mellitus with diabetic chronic kidney disease: E11.22

## 2019-07-18 HISTORY — DX: Polyneuropathy, unspecified: G62.9

## 2019-07-18 LAB — POCT HEMOGLOBIN: Hemoglobin: 12.6 g/dL (ref 11–14.6)

## 2019-07-18 MED ORDER — METFORMIN HCL ER 500 MG PO TB24: 500.0000 mg | ORAL_TABLET | Freq: Every day | ORAL | 3 refills | Status: DC

## 2019-07-18 MED ORDER — HYDROCHLOROTHIAZIDE 25 MG PO TABS: 25.0000 mg | ORAL_TABLET | Freq: Every day | ORAL | 3 refills | Status: DC

## 2019-07-18 MED ORDER — BUPROPION HCL ER (XL) 300 MG PO TB24: 300.0000 mg | ORAL_TABLET | Freq: Every day | ORAL | 3 refills | Status: DC

## 2019-07-18 MED ORDER — GABAPENTIN 300 MG PO CAPS: ORAL_CAPSULE | ORAL | 3 refills | Status: DC

## 2019-07-18 MED ORDER — PROPRANOLOL HCL ER 120 MG PO CP24: 120.0000 mg | ORAL_CAPSULE | Freq: Every day | ORAL | 2 refills | Status: DC

## 2019-07-18 NOTE — Progress Notes (Signed)
Subjective: Chief Complaint  Patient presents with  . Numbness    bilateral hands (figer tips) and feet (toes)  . Medication Refill    HPI: Ana Young is a 59 y.o. presenting to clinic today to discuss the following:  Bilateral Hand and Feet Tingling Patient is a 59y/o female presenting with acute on chronic bilateral finger and toe numbness and tingling. She reports this first began during her chemotherapy treatments for breast cancer. However, she did not let anyone know for fear of stopping treatment. She finished treatment in 2015 and it has persisted since. However, as of late it has gotten more frequent and symptoms of tingling are worse and more intense. She states it occurs every day multiple times per day and is not associated with any pain and does not radiate outside of the hands or feet. No nausea, vomiting, dizziness, gait imbalance, or confusion. No recent viral illnesses.     ROS noted in HPI.    Social History   Tobacco Use  Smoking Status Never Smoker  Smokeless Tobacco Never Used    Objective: BP 101/70   Pulse 71   Wt 176 lb 3.2 oz (79.9 kg)   SpO2 99%   BMI 27.60 kg/m  Vitals and nursing notes reviewed  Physical Exam Gen: Alert and Oriented x 3, NAD MSK: 5/5 strength in UE and LE bilateraly, gross sensation intact, +2 pulses bilateral radial and pedal pulses Ext: no clubbing, cyanosis, or edema Neuro: No gross deficits Skin: warm, dry, intact, no rashes, no open sores on feet or hands, no discoloration of the skin   Results for orders placed or performed in visit on 07/18/19 (from the past 72 hour(s))  POC Hemoglobin (dx code Z13.0)     Status: None   Collection Time: 07/18/19  3:10 PM  Result Value Ref Range   Hemoglobin 12.6 11 - 14.6 g/dL    Assessment/Plan:  Peripheral neuropathy Differential includes post chemo polyneuropathy vs vitamin deficiency vs DM polyneuropathy. A1c is elevated at 12.6 so patient will need to return to  clinic for DM appointment and follow up.  - Treat DM. With an A1c of 12.6% will start on Metformin 500mg  BID for two weeks, then increase to 1000mg  BID. - Will give her counseling on diet, exercise, and lifestyle modifications and recheck in 1 months after intervention to see progress. If still significantly elevated (over 10) will add second oral agent and consider adding insulin at the same time. - Follow up the rest of the lab work ordered today (CBC, Iron panel, Vit B12, Folate, SPEP, RPR, and HIV) If no improvement after controlling blood sugar consider adding duloxetine for management.  Type 2 diabetes mellitus with chronic kidney disease, without long-term current use of insulin (HCC) New diagnosis, A1c of 12.6% - Treat DM. With an A1c of 12.6% will start on Metformin 500mg  BID for two weeks, then increase to 1000mg  BID. - Will give her counseling on diet, exercise, and lifestyle modifications and recheck in 1 months after intervention to see progress. If still significantly elevated (over 10) will add second oral agent and consider adding insulin at the same time.   PATIENT EDUCATION PROVIDED: See AVS    Diagnosis and plan along with any newly prescribed medication(s) were discussed in detail with this patient today. The patient verbalized understanding and agreed with the plan. Patient advised if symptoms worsen return to clinic or ER.    Orders Placed This Encounter  Procedures  .  CBC with Differential  . TSH  . Vitamin B12  . Folate  . Iron, TIBC and Ferritin Panel  . HIV antibody (with reflex)  . RPR  . Protein electrophoresis, serum  . POC Hemoglobin (dx code Z13.0)    Associate with Z13.0    Meds ordered this encounter  Medications  . gabapentin (NEURONTIN) 300 MG capsule    Sig: TAKE 1 CAPSULE BY MOUTH EVERYDAY AT BEDTIME    Dispense:  30 capsule    Refill:  3  . buPROPion (WELLBUTRIN XL) 300 MG 24 hr tablet    Sig: Take 1 tablet (300 mg total) by mouth daily.     Dispense:  30 tablet    Refill:  3  . hydrochlorothiazide (HYDRODIURIL) 25 MG tablet    Sig: Take 1 tablet (25 mg total) by mouth daily.    Dispense:  90 tablet    Refill:  3  . propranolol ER (INDERAL LA) 120 MG 24 hr capsule    Sig: Take 1 capsule (120 mg total) by mouth daily.    Dispense:  90 capsule    Refill:  [place], DO 07/18/2019, 2:46 PM PGY-3 [place]

## 2019-07-18 NOTE — Progress Notes (Signed)
Patient with A1c of 12.6% today in clinic. Called patient and informed her of diagnosis. Will see her back in 2-4 weeks to go over lifestyle modification.  Sending in Metformin today

## 2019-07-18 NOTE — Patient Instructions (Signed)
It was great to see you today! Thank you for letting me participate in your care!  Today, we discussed your continued numbness and tingling at your hands and feet. I am getting some lab work today and will call you if anything is abnormal so we can discuss it. For now, let's wait until we have some findings before starting a new medication. You can continue using Gabapentin.  Be well, Harolyn Rutherford, DO PGY-3, [facility]

## 2019-07-18 NOTE — Assessment & Plan Note (Addendum)
Differential includes post chemo polyneuropathy vs vitamin deficiency vs DM polyneuropathy. A1c is elevated at 12.6 so patient will need to return to clinic for DM appointment and follow up.  - Treat DM. With an A1c of 12.6% will start on Metformin 500mg  BID for two weeks, then increase to 1000mg  BID. - Will give her counseling on diet, exercise, and lifestyle modifications and recheck in 1 months after intervention to see progress. If still significantly elevated (over 10) will add second oral agent and consider adding insulin at the same time. - Follow up the rest of the lab work ordered today (CBC, Iron panel, Vit B12, Folate, SPEP, RPR, and HIV) If no improvement after controlling blood sugar consider adding duloxetine for management.

## 2019-07-18 NOTE — Assessment & Plan Note (Addendum)
New diagnosis, A1c of 12.6% - Treat DM. With an A1c of 12.6% will start on Metformin 500mg  BID for two weeks, then increase to 1000mg  BID. - Will give her counseling on diet, exercise, and lifestyle modifications and recheck in 1 months after intervention to see progress. If still significantly elevated (over 10) will add second oral agent and consider adding insulin at the same time.

## 2019-07-21 LAB — IRON,TIBC AND FERRITIN PANEL
Ferritin: 124 ng/mL (ref 15–150)
Iron Saturation: 14 % — ABNORMAL LOW (ref 15–55)
Iron: 49 ug/dL (ref 27–159)
Total Iron Binding Capacity: 361 ug/dL (ref 250–450)
UIBC: 312 ug/dL (ref 131–425)

## 2019-07-21 LAB — RPR: RPR Ser Ql: NONREACTIVE

## 2019-07-21 LAB — CBC WITH DIFFERENTIAL/PLATELET
Basophils Absolute: 0 10*3/uL (ref 0.0–0.2)
Basos: 1 %
EOS (ABSOLUTE): 0.1 10*3/uL (ref 0.0–0.4)
Eos: 1 %
Hematocrit: 38.5 % (ref 34.0–46.6)
Hemoglobin: 12.8 g/dL (ref 11.1–15.9)
Immature Grans (Abs): 0 10*3/uL (ref 0.0–0.1)
Immature Granulocytes: 1 %
Lymphocytes Absolute: 1.9 10*3/uL (ref 0.7–3.1)
Lymphs: 31 %
MCH: 27.8 pg (ref 26.6–33.0)
MCHC: 33.2 g/dL (ref 31.5–35.7)
MCV: 84 fL (ref 79–97)
Monocytes Absolute: 0.4 10*3/uL (ref 0.1–0.9)
Monocytes: 7 %
Neutrophils Absolute: 3.8 10*3/uL (ref 1.4–7.0)
Neutrophils: 59 %
Platelets: 338 10*3/uL (ref 150–450)
RBC: 4.61 x10E6/uL (ref 3.77–5.28)
RDW: 14.1 % (ref 11.7–15.4)
WBC: 6.3 10*3/uL (ref 3.4–10.8)

## 2019-07-21 LAB — PROTEIN ELECTROPHORESIS, SERUM
A/G Ratio: 1.3 (ref 0.7–1.7)
Albumin ELP: 4 g/dL (ref 2.9–4.4)
Alpha 1: 0.2 g/dL (ref 0.0–0.4)
Alpha 2: 0.8 g/dL (ref 0.4–1.0)
Beta: 1.1 g/dL (ref 0.7–1.3)
Gamma Globulin: 1.1 g/dL (ref 0.4–1.8)
Globulin, Total: 3.1 g/dL (ref 2.2–3.9)
Total Protein: 7.1 g/dL (ref 6.0–8.5)

## 2019-07-21 LAB — HIV ANTIBODY (ROUTINE TESTING W REFLEX): HIV Screen 4th Generation wRfx: NONREACTIVE

## 2019-07-21 LAB — VITAMIN B12: Vitamin B-12: 1177 pg/mL (ref 232–1245)

## 2019-07-21 LAB — FOLATE: Folate: 13.2 ng/mL (ref >3.0)

## 2019-07-21 LAB — TSH: TSH: 1.65 u[IU]/mL (ref 0.450–4.500)

## 2019-07-22 ENCOUNTER — Telehealth: Payer: Self-pay

## 2019-07-22 NOTE — Telephone Encounter (Signed)
Informed patient of lab results per Dr. Garlan Fillers.  Ana Young, [place]

## 2019-07-27 ENCOUNTER — Ambulatory Visit (INDEPENDENT_AMBULATORY_CARE_PROVIDER_SITE_OTHER): Payer: BC Managed Care – PPO | Admitting: Family Medicine

## 2019-07-27 ENCOUNTER — Other Ambulatory Visit: Payer: Self-pay

## 2019-07-27 VITALS — BP 115/75 | HR 77 | Wt 171.8 lb

## 2019-07-27 DIAGNOSIS — K649 Unspecified hemorrhoids: Secondary | ICD-10-CM

## 2019-07-27 DIAGNOSIS — R7303 Prediabetes: Secondary | ICD-10-CM

## 2019-07-27 LAB — POCT GLYCOSYLATED HEMOGLOBIN (HGB A1C): Hemoglobin A1C: 5.5 % (ref 4.0–5.6)

## 2019-07-27 MED ORDER — RELION BLOOD GLUCOSE TEST VI STRP: ORAL_STRIP | 12 refills | Status: DC

## 2019-07-27 MED ORDER — RELION CONFIRM GLUCOSE MONITOR W/DEVICE KIT: 1.0000 | PACK | Freq: Every day | 0 refills | Status: DC

## 2019-07-27 MED ORDER — RELION LANCETS ULTRA-THIN 30G MISC: 1.0000 "application " | Freq: Every day | 10 refills | Status: DC

## 2019-07-27 MED ORDER — RELION ALCOHOL SWABS PADS: 1.0000 "application " | MEDICATED_PAD | Freq: Every day | 12 refills | Status: DC

## 2019-07-27 MED ORDER — RELION LANCET DEVICES 30G MISC: 1.0000 | Freq: Every day | 12 refills | Status: DC

## 2019-07-27 NOTE — Patient Instructions (Addendum)
It was great to see you today! Thank you for letting me participate in your care!  Your test for diabetes today was negative. You do NOT have diabetes. Please stop taking metformin but return to clinic if you have any symptoms of fatigue, increased thirst, or increased urination.  Today, we discussed your painful rectal bleeding. Since you have a history of hemorrhoids I think it is reasonable to try conservative management. Please use daily sitz baths multiple times per day for 15 minutes at a time. You can also purchase lidocaine cream and Tucks pads. I also suggest you take a laxative to make your bowel movements softer and more easily passed. If your pain and bleeding is not resolved in 2-3 weeks please return to the clinic for further workup.    Be well, Harolyn Rutherford, DO PGY-3, [facility]   Hemorrhoids Hemorrhoids are swollen veins that may develop:  In the butt (rectum). These are called internal hemorrhoids.  Around the opening of the butt (anus). These are called external hemorrhoids. Hemorrhoids can cause pain, itching, or bleeding. Most of the time, they do not cause serious problems. They usually get better with diet changes, lifestyle changes, and other home treatments. What are the causes? This condition may be caused by:  Having trouble pooping (constipation).  Pushing hard (straining) to poop.  Watery poop (diarrhea).  Pregnancy.  Being very overweight (obese).  Sitting for long periods of time.  Heavy lifting or other activity that causes you to strain.  Anal sex.  Riding a bike for a long period of time. What are the signs or symptoms? Symptoms of this condition include:  Pain.  Itching or soreness in the butt.  Bleeding from the butt.  Leaking poop.  Swelling in the area.  One or more lumps around the opening of your butt. How is this diagnosed? A doctor can often diagnose this condition by looking at the affected area. The  doctor may also:  Do an exam that involves feeling the area with a gloved hand (digital rectal exam).  Examine the area inside your butt using a small tube (anoscope).  Order blood tests. This may be done if you have lost a lot of blood.  Have you get a test that involves looking inside the colon using a flexible tube with a camera on the end (sigmoidoscopy or colonoscopy). How is this treated? This condition can usually be treated at home. Your doctor may tell you to change what you eat, make lifestyle changes, or try home treatments. If these do not help, procedures can be done to remove the hemorrhoids or make them smaller. These may involve:  Placing rubber bands at the base of the hemorrhoids to cut off their blood supply.  Injecting medicine into the hemorrhoids to shrink them.  Shining a type of light energy onto the hemorrhoids to cause them to fall off.  Doing surgery to remove the hemorrhoids or cut off their blood supply. Follow these instructions at home: Eating and drinking   Eat foods that have a lot of fiber in them. These include whole grains, beans, nuts, fruits, and vegetables.  Ask your doctor about taking products that have added fiber (fibersupplements).  Reduce the amount of fat in your diet. You can do this by: ? Eating low-fat dairy products. ? Eating less red meat. ? Avoiding processed foods.  Drink enough fluid to keep your pee (urine) pale yellow. Managing pain and swelling   Take a warm-water bath (  sitz bath) for 20 minutes to ease pain. Do this 3-4 times a day. You may do this in a bathtub or using a portable sitz bath that fits over the toilet.  If told, put ice on the painful area. It may be helpful to use ice between your warm baths. ? Put ice in a plastic bag. ? Place a towel between your skin and the bag. ? Leave the ice on for 20 minutes, 2-3 times a day. General instructions  Take over-the-counter and prescription medicines only as told  by your doctor. ? Medicated creams and medicines may be used as told.  Exercise often. Ask your doctor how much and what kind of exercise is best for you.  Go to the bathroom when you have the urge to poop. Do not wait.  Avoid pushing too hard when you poop.  Keep your butt dry and clean. Use wet toilet paper or moist towelettes after pooping.  Do not sit on the toilet for a long time.  Keep all follow-up visits as told by your doctor. This is important. Contact a doctor if you:  Have pain and swelling that do not get better with treatment or medicine.  Have trouble pooping.  Cannot poop.  Have pain or swelling outside the area of the hemorrhoids. Get help right away if you have:  Bleeding that will not stop. Summary  Hemorrhoids are swollen veins in the butt or around the opening of the butt.  They can cause pain, itching, or bleeding.  Eat foods that have a lot of fiber in them. These include whole grains, beans, nuts, fruits, and vegetables.  Take a warm-water bath (sitz bath) for 20 minutes to ease pain. Do this 3-4 times a day. This information is not intended to replace advice given to you by your health care provider. Make sure you discuss any questions you have with your health care provider. Document Revised: 07/01/2018 Document Reviewed: 11/12/2017 Elsevier Patient Education  [place].

## 2019-07-27 NOTE — Assessment & Plan Note (Signed)
Patient with known history with complaint of painful bleeding with bowel movements. Given acuity and history will treat conservatively for hemorrhoids. Reassuring that her last colonoscopy was in 2017 and was completely normal with 10 year follow up recommended. No systemic symptoms. - Sitz baths, Tucks pads, and lidocaine cream - Can look into a calcium channel blocker ointment if no improvement with conservation management - Did stress to patient we would need to do an anoscopy and a rectal exam next time if symptoms do not improve. She agrees.

## 2019-07-27 NOTE — Progress Notes (Signed)
     Subjective: Chief Complaint  Patient presents with  . elevated CBG    HPI: Ana Young is a 59 y.o. presenting to clinic today to discuss the following:  Concern for diabetes Ana Young returns for follow up due to my concern for her being diabetic. At her last appointment I did an extensive work up and thought she had an A1c of 12.6. However, it seems to wrong POC lab was ordered and done as only a Hemoglobin was performed. I am obtaining the A1c today. I have had her temporarily stop the use of Metformin. Thankfully, she has not had any side effects from the medication except gas. Her A1c today was normal at 5.5%. Expressed and explained the mistake to the patient who graciously understood.  Hemorrhoids Patient states she has had hemorrhoids in the past with pain, itching, and bleeding. No constipation. She also endorses a small amount of stool in her underwear that she does not know how it got there. ThiStarted a few weeks ago with onset of new job requiring a lot of pushing and pulling. Has had them in the past. Soaked in the tub and a "big clot" passed. She does have pain and discomfort at times in the anal region. Her last colonoscopy was 2017 and was normal with recommendation of 10 years for the next one. She is wants to try conservative management first.    ROS noted in HPI.    Social History   Tobacco Use  Smoking Status Never Smoker  Smokeless Tobacco Never Used   Objective: BP 115/75   Pulse 77   Wt 171 lb 12.8 oz (77.9 kg)   SpO2 97%   BMI 26.91 kg/m  Vitals and nursing notes reviewed  Physical Exam Gen: Alert and Oriented x 3, NAD Abd: non-distended, non-tender, soft, +bs in all four quadrants GU: Declined today Skin: warm, dry, intact, no rashes  Results for orders placed or performed in visit on 07/27/19 (from the past 72 hour(s))  HgB A1c     Status: None   Collection Time: 07/27/19  8:50 AM  Result Value Ref Range   Hemoglobin A1C 5.5 4.0 - 5.6 %    HbA1c POC (<> result, manual entry)     HbA1c, POC (prediabetic range)     HbA1c, POC (controlled diabetic range)      Assessment/Plan:  Hemorrhoids Patient with known history with complaint of painful bleeding with bowel movements. Given acuity and history will treat conservatively for hemorrhoids. Reassuring that her last colonoscopy was in 2017 and was completely normal with 10 year follow up recommended. No systemic symptoms. - Sitz baths, Tucks pads, and lidocaine cream - Can look into a calcium channel blocker ointment if no improvement with conservation management - Did stress to patient we would need to do an anoscopy and a rectal exam next time if symptoms do not improve. She agrees.   PATIENT EDUCATION PROVIDED: See AVS    Diagnosis and plan along with any newly prescribed medication(s) were discussed in detail with this patient today. The patient verbalized understanding and agreed with the plan. Patient advised if symptoms worsen return to clinic or ER.   Orders Placed This Encounter  Procedures  . HgB A1c     Harolyn Rutherford, DO 07/27/2019, 8:39 AM PGY-3 [place]

## 2019-09-05 ENCOUNTER — Other Ambulatory Visit: Payer: Self-pay | Admitting: Family Medicine

## 2019-09-05 DIAGNOSIS — Z9889 Other specified postprocedural states: Secondary | ICD-10-CM

## 2019-10-05 ENCOUNTER — Other Ambulatory Visit: Payer: Self-pay | Admitting: Family Medicine

## 2019-11-29 ENCOUNTER — Telehealth: Payer: Self-pay | Admitting: *Deleted

## 2019-11-29 NOTE — Telephone Encounter (Signed)
-----   Message from Nuala Alpha, DO sent at 11/28/2019  4:38 PM EDT ----- Regarding: Appointment Hey, can we call Mrs. Ana Young and get her to schedule an appointment to come and see me? Her oncologist has called and she is having neck pain. Thanks!  Tim

## 2019-11-29 NOTE — Telephone Encounter (Signed)
LVM for patient to call and schedule an appointment.  Ana Young, Ana Young

## 2019-11-29 NOTE — Telephone Encounter (Signed)
LMOVM for pt to call back and make an appt with her pcp. Ana Young Kennon Holter, CMA

## 2019-12-01 ENCOUNTER — Ambulatory Visit
Admission: RE | Admit: 2019-12-01 | Discharge: 2019-12-01 | Disposition: A | Discharge status: Home / Self Care | Payer: 59 | Source: Ambulatory Visit | Attending: Family Medicine | Admitting: Family Medicine

## 2019-12-01 ENCOUNTER — Other Ambulatory Visit: Payer: Self-pay | Admitting: Family Medicine

## 2019-12-01 ENCOUNTER — Other Ambulatory Visit: Payer: Self-pay

## 2019-12-01 ENCOUNTER — Encounter: Payer: Self-pay | Admitting: Family Medicine

## 2019-12-01 ENCOUNTER — Ambulatory Visit (INDEPENDENT_AMBULATORY_CARE_PROVIDER_SITE_OTHER): Payer: 59 | Admitting: Family Medicine

## 2019-12-01 VITALS — BP 110/68 | HR 72 | Ht 67.0 in | Wt 174.0 lb

## 2019-12-01 DIAGNOSIS — M79602 Pain in left arm: Secondary | ICD-10-CM

## 2019-12-01 DIAGNOSIS — M19019 Primary osteoarthritis, unspecified shoulder: Secondary | ICD-10-CM

## 2019-12-01 MED ORDER — MELOXICAM 15 MG PO TABS: 15.0000 mg | ORAL_TABLET | Freq: Every day | ORAL | 0 refills | Status: AC

## 2019-12-01 NOTE — Patient Instructions (Addendum)
It was great to meet you today! Thank you for letting me participate in your care!  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 I am getting some x-rays to see if you have any additional problems. Please take Meloxicam as prescribed and you can use Capsaicin cream as needed.  I will call you with x-ray results.    Be well, Harolyn Rutherford, DO PGY-3, [facility]

## 2019-12-01 NOTE — Progress Notes (Signed)
    SUBJECTIVE:   CHIEF COMPLAINT / HPI:   Neck Pain/Left Shoulder pain Patient states she has had neck/shoulder pain for the past 6 months that has slowly and progressively gotten worse. The pain is described as intermittent, dull pain that is worse at night time and when laying down or any activity that places pressure on her left side. The pain gets to a 10/10 and has woken her up from sleep. The pain radiates down her arm but never goes past the elbow. During the day time she has no pain and states she is fine. She is not dropping things but feels like the hand may be weaker. It is difficulty for her to determine as she is right handed. She has never had this before, no inciting event, never had any rash or swelling, and no trauma. No fever, chills, night sweats, weight loss, nausea, vomiting, no cough, SOB, or chest pain.   PERTINENT  PMH / PSH: HTN, Breast Cancer  OBJECTIVE:   BP 110/68   Pulse 72   Ht 5\' 7"  (1.702 m)   Wt 174 lb (78.9 kg)   SpO2 99%   BMI 27.25 kg/m   Gen: NAD Cardiac: RRR, no murmurs Resp: CTAB MSK Neck No TTP over the cervical spine processes, FROM in flexion, extension, rotation, and side bending.  Shoulder, Left: TTP noted at the [place]. No evidence of bony deformity, asymmetry, or muscle atrophy; No tenderness over long head of biceps (bicipital groove). TTP at [facility] joint. Limited active and passive range of motion due to pain in adduction and flexion. Strength 5/5 throughout. Sensation intact. Peripheral pulses intact.  Special Tests:   - Empty can: NEG   - Hawkins: NEG   - Neer test: Positive   - Obrien's test: NEG   - Yergason's: NEG   - Apprehension test: NEG   ASSESSMENT/PLAN:   Left arm pain I favor some type of AC joint OA over cervical root impingement as the pain does not radiate past her elbow and her grip strength is normal. However, still could be playing a role. Given her history of breast cancer I will get a chest x-ray just to rule  out possible mass or lesion causing compression syndrome. - Meloxicam 15mg  for 14 days with Capzacin cream OTC TID as needed. If no improvement will send to [facility] for ultrasound and for formal PT referral - X-rays negative for mass in lung apices, negative for any AC joint arthritis or arthritis or the shoulder joint - Some evidence of worsening cervical spine narrowing and arthropathy.     Nuala Alpha, [place]

## 2019-12-02 DIAGNOSIS — M79602 Pain in left arm: Secondary | ICD-10-CM | POA: Insufficient documentation

## 2019-12-02 NOTE — Assessment & Plan Note (Signed)
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 However, still could be playing a role. Given her history of breast cancer I will get a chest x-ray just to rule out possible mass or lesion causing compression syndrome. - Meloxicam 15mg  for 14 days with Capzacin cream OTC TID as needed. If no improvement will send to [facility] for ultrasound and for formal PT referral - X-rays negative for mass in lung apices, negative for any AC joint arthritis or arthritis or the shoulder joint - Some evidence of worsening cervical spine narrowing and arthropathy.

## 2019-12-08 ENCOUNTER — Other Ambulatory Visit: Payer: Self-pay | Admitting: Family Medicine

## 2019-12-08 ENCOUNTER — Telehealth: Payer: Self-pay

## 2019-12-08 MED ORDER — PREDNISONE 5 MG PO TABS
ORAL_TABLET | ORAL | 0 refills | Status: DC
Start: 2019-12-08 — End: 2020-08-29

## 2019-12-08 NOTE — Telephone Encounter (Signed)
Patient Ana Young on nurse line requesting imaging results. I am happy to relay any message. Thanks.

## 2019-12-08 NOTE — Progress Notes (Signed)
Evidence of neck arthritis seen on x-ray that has worsened. No improvement since last appointment. Will try a short course of prednisone.

## 2019-12-16 ENCOUNTER — Other Ambulatory Visit: Payer: Self-pay

## 2019-12-16 ENCOUNTER — Other Ambulatory Visit: Payer: Self-pay | Admitting: Family Medicine

## 2019-12-16 ENCOUNTER — Ambulatory Visit
Admission: RE | Admit: 2019-12-16 | Discharge: 2019-12-16 | Disposition: A | Discharge status: Home / Self Care | Payer: 59 | Source: Ambulatory Visit | Attending: Family Medicine | Admitting: Family Medicine

## 2019-12-16 DIAGNOSIS — Z853 Personal history of malignant neoplasm of breast: Secondary | ICD-10-CM

## 2019-12-16 DIAGNOSIS — Z1231 Encounter for screening mammogram for malignant neoplasm of breast: Secondary | ICD-10-CM

## 2019-12-16 DIAGNOSIS — Z9889 Other specified postprocedural states: Secondary | ICD-10-CM

## 2019-12-19 ENCOUNTER — Other Ambulatory Visit: Payer: Self-pay

## 2019-12-19 ENCOUNTER — Ambulatory Visit (INDEPENDENT_AMBULATORY_CARE_PROVIDER_SITE_OTHER): Payer: 59 | Admitting: Family Medicine

## 2019-12-19 VITALS — BP 123/75 | HR 73 | Ht 67.0 in | Wt 176.0 lb

## 2019-12-19 DIAGNOSIS — I10 Essential (primary) hypertension: Secondary | ICD-10-CM

## 2019-12-19 DIAGNOSIS — M79602 Pain in left arm: Secondary | ICD-10-CM

## 2019-12-19 MED ORDER — GABAPENTIN 300 MG PO CAPS: 300.0000 mg | ORAL_CAPSULE | Freq: Two times a day (BID) | ORAL | 3 refills | Status: DC

## 2019-12-19 NOTE — Progress Notes (Signed)
    SUBJECTIVE:   CHIEF COMPLAINT / HPI:   Neck and left shoulder pain F/u Patient presents today for a follow up of her left shoulder and neck pain. I discussed her x-ray results which suggested progressively worsening cervical osteoarthritis as the most likely cause. Discussed her chest x-ray was clear which I obtained due to history of breast cancer. She has had her mammogram this year which was negative. Overall, her pain has improved tremendously and she has finished her steroid dose pack. She has some soreness still in her right shoulder but it is much better than what it was at our last appointment. No fevers, chills, loss of ability to do ADLs.  HTN Well controlled on HCTZ 25mg  daily. She has no complaints about taking the medication and has no difficulty obtaining the medication. She has been complaint taking it every day. Checking BMP today.   PERTINENT  PMH / PSH: Hx of breast cancer s/p masectomy, HTN  OBJECTIVE:   BP 123/75   Pulse 73   Ht 5\' 7"  (1.702 m)   Wt 176 lb (79.8 kg)   SpO2 99%   BMI 27.57 kg/m   Gen: NAD MSK: Leftt shoulder: No ecchymosis or obvious deformity of right shoulder. No TTP over [facility] Joint or biceps tendon insertion point. Active ROM intact, 5/5 strength, gross sensation intact. Neck: No obvious deformity, no cervical spinous process tenderness, full active ROM in flexion, extension, sidebending, and rotation of cervical spine.  ASSESSMENT/PLAN:   Essential hypertension Checking BMP today as it has been over one year and patient is on thiazide diuretic  Left arm pain X-rays suggestive of progressively worsening cervical OA as the cause of her left arm pain. Responded well with steroid dose pack. Will continue to achieve symptom and flare control with conservative management.     Nuala Alpha, [place]

## 2019-12-19 NOTE — Patient Instructions (Addendum)
It was great to see you today! Thank you for letting me participate in your care!  Today, we discussed your x-ray results and the findings of arthritis in your cervical spine. I am glad the prednisone helped! I am increasing your Gabapentin as your pain is likely from nerve impingement and this may help resolve the remaining pain you are having. If it does not please return to the clinic.  Be well, Harolyn Rutherford, DO PGY-3, [facility]

## 2019-12-20 LAB — BASIC METABOLIC PANEL
BUN/Creatinine Ratio: 33 — ABNORMAL HIGH (ref 9–23)
BUN: 24 mg/dL (ref 6–24)
CO2: 24 mmol/L (ref 20–29)
Calcium: 9.7 mg/dL (ref 8.7–10.2)
Chloride: 99 mmol/L (ref 96–106)
Creatinine, Ser: 0.73 mg/dL (ref 0.57–1.00)
GFR calc Af Amer: 106 mL/min/{1.73_m2} (ref >59)
GFR calc non Af Amer: 92 mL/min/{1.73_m2} (ref >59)
Glucose: 104 mg/dL — ABNORMAL HIGH (ref 65–99)
Potassium: 4.1 mmol/L (ref 3.5–5.2)
Sodium: 138 mmol/L (ref 134–144)

## 2019-12-21 NOTE — Assessment & Plan Note (Signed)
Checking BMP today as it has been over one year and patient is on thiazide diuretic

## 2019-12-21 NOTE — Assessment & Plan Note (Addendum)
X-rays suggestive of progressively worsening cervical OA as the cause of her left arm pain. Responded well with steroid dose pack. Will continue to achieve symptom and flare control with conservative management.

## 2020-01-24 ENCOUNTER — Other Ambulatory Visit: Payer: Self-pay | Admitting: *Deleted

## 2020-01-24 ENCOUNTER — Other Ambulatory Visit: Payer: Self-pay | Admitting: Family Medicine

## 2020-01-24 MED ORDER — PROPRANOLOL HCL ER 120 MG PO CP24: 120.0000 mg | ORAL_CAPSULE | Freq: Every day | ORAL | 2 refills | Status: DC

## 2020-01-24 MED ORDER — GABAPENTIN 300 MG PO CAPS: 300.0000 mg | ORAL_CAPSULE | Freq: Two times a day (BID) | ORAL | 3 refills | Status: DC

## 2020-01-24 MED ORDER — SERTRALINE HCL 50 MG PO TABS: 50.0000 mg | ORAL_TABLET | Freq: Every day | ORAL | 1 refills | Status: DC

## 2020-01-24 MED ORDER — HYDROCHLOROTHIAZIDE 25 MG PO TABS: 25.0000 mg | ORAL_TABLET | Freq: Every day | ORAL | 3 refills | Status: DC

## 2020-01-24 NOTE — Telephone Encounter (Signed)
Contacted pt and she is now using [facility] as her pharmacy, also requesting hydroxyzine but did not see on current med list. .Heyward Douthit Zimmerman Rumple, CMA

## 2020-01-26 ENCOUNTER — Other Ambulatory Visit: Payer: Self-pay | Admitting: Family Medicine

## 2020-01-26 DIAGNOSIS — G6289 Other specified polyneuropathies: Secondary | ICD-10-CM

## 2020-01-26 MED ORDER — GABAPENTIN 300 MG PO CAPS: 300.0000 mg | ORAL_CAPSULE | Freq: Every day | ORAL | 3 refills | Status: DC

## 2020-01-26 NOTE — Telephone Encounter (Signed)
Received fax from [facility]. Need clarification on the directions for Gabapentin Cap 300 mg.  Summary: Take 1 capsule (300 mg total) by mouth 2 (two) times daily. TAKE 1 CAPSULE BY MOUTH EVERYDAY AT BEDTIME, Starting Tue 01/24/2020.  Ottis Stain, CMA

## 2020-01-26 NOTE — Telephone Encounter (Signed)
2nd request for clarification on gabapentin sig. Also requesting Rx for HydrOXYzine but did not see on current med list. Leonie Amacher Katharina Caper, CMA

## 2020-01-26 NOTE — Telephone Encounter (Signed)
Resent gabapentin with correct sig - 1 tab QHS.  Looks like hydroxyzine was discontinued in January, so it would be better to see her for an appointment first before prescribing.

## 2020-05-28 ENCOUNTER — Other Ambulatory Visit: Payer: Self-pay

## 2020-05-28 MED ORDER — BUPROPION HCL ER (XL) 300 MG PO TB24: 300.0000 mg | ORAL_TABLET | Freq: Every day | ORAL | 3 refills | Status: DC

## 2020-06-12 ENCOUNTER — Other Ambulatory Visit: Payer: Self-pay | Admitting: *Deleted

## 2020-06-12 DIAGNOSIS — G6289 Other specified polyneuropathies: Secondary | ICD-10-CM

## 2020-06-12 MED ORDER — GABAPENTIN 300 MG PO CAPS: 300.0000 mg | ORAL_CAPSULE | Freq: Every day | ORAL | 2 refills | Status: DC

## 2020-06-12 NOTE — Telephone Encounter (Signed)
Needs appointment in next 3 months.  Any provider is fine.

## 2020-06-13 NOTE — Telephone Encounter (Signed)
Called patient to schedule future appointment. No answer and the voicemail was full.

## 2020-06-22 NOTE — Telephone Encounter (Signed)
Called pt. No answer, VM full. Will send letter. Ana Young, CMA

## 2020-07-12 ENCOUNTER — Other Ambulatory Visit: Payer: Self-pay

## 2020-07-12 MED ORDER — SERTRALINE HCL 50 MG PO TABS: 50.0000 mg | ORAL_TABLET | Freq: Every day | ORAL | 1 refills | Status: DC

## 2020-07-26 ENCOUNTER — Other Ambulatory Visit: Payer: 59

## 2020-07-26 ENCOUNTER — Other Ambulatory Visit: Payer: Self-pay

## 2020-07-26 DIAGNOSIS — Z20822 Contact with and (suspected) exposure to covid-19: Secondary | ICD-10-CM

## 2020-07-28 LAB — NOVEL CORONAVIRUS, NAA: SARS-CoV-2, NAA: DETECTED — AB

## 2020-07-28 LAB — SARS-COV-2, NAA 2 DAY TAT

## 2020-07-29 ENCOUNTER — Telehealth: Payer: Self-pay | Admitting: Infectious Diseases

## 2020-07-29 NOTE — Telephone Encounter (Signed)
Called to discuss with patient about COVID-19 symptoms and the use of one of the available treatments for those with mild to moderate Covid symptoms and at a high risk of hospitalization.  Pt appears to qualify for outpatient treatment due to co-morbid conditions and/or a member of an at-risk group in accordance with the FDA Emergency Use Authorization.    Symptom onset: unknown Vaccinated: not documented  Booster?  Immunocompromised? no Qualifiers: SVI 4, depression, HTN, unvaccinated   Unable to reach pt - LVM and Mychart    [employer]

## 2020-07-30 ENCOUNTER — Telehealth: Payer: Self-pay

## 2020-07-30 ENCOUNTER — Telehealth: Payer: Self-pay | Admitting: Physician Assistant

## 2020-07-30 ENCOUNTER — Other Ambulatory Visit: Payer: 59

## 2020-07-30 NOTE — Telephone Encounter (Signed)
Called patient.  She states she is doing okay, has some congestion, but no difficulty breathing.  Advised that she can have covid vaccine 10 days after positive test, which would be 1/31.  She voiced understanding.

## 2020-07-30 NOTE — Telephone Encounter (Signed)
Pt is not qualified for the monoclonal antibody infusion or anti viral tx as symptoms started on 07/18/20.  Preventative practices reviewed. Patient verbalized understanding.  [place], [place]  07/30/2020 2:07 PM

## 2020-07-30 NOTE — Telephone Encounter (Signed)
Patient calls nurse line stating she tested positive for Covid on 07/26/20. Patient has an apt on 01/25 for first covid vaccine, she has cancelled this. Patient is wanting to know when she should get the first vaccine post infection. Please advise.

## 2020-07-31 ENCOUNTER — Ambulatory Visit: Payer: 59 | Attending: Internal Medicine

## 2020-07-31 DIAGNOSIS — Z23 Encounter for immunization: Secondary | ICD-10-CM

## 2020-07-31 NOTE — Progress Notes (Signed)
   Covid-19 Vaccination Clinic  Name:  Ana Young    MRN: [id number] DOB: [birth year]  07/31/2020  Ms. Ana Young was observed post Covid-19 immunization for 15 minutes without incident. She was provided with Vaccine Information Sheet and instruction to access the V-Safe system.   Ms. Ana Young was instructed to call 911 with any severe reactions post vaccine: Marland Kitchen Difficulty breathing  . Swelling of face and throat  . A fast heartbeat  . A bad rash all over body  . Dizziness and weakness   Immunizations Administered    Name Date Dose VIS Date Route   PFIZER Comrnaty(Gray TOP) Covid-19 Vaccine 07/31/2020  1:29 PM 0.3 mL 06/14/2020 Intramuscular   Manufacturer: [place]   Lot: [id number]   NDC: [phone]

## 2020-08-14 ENCOUNTER — Other Ambulatory Visit: Payer: Self-pay

## 2020-08-14 MED ORDER — PROPRANOLOL HCL ER 120 MG PO CP24: 120.0000 mg | ORAL_CAPSULE | Freq: Every day | ORAL | 0 refills | Status: DC

## 2020-08-14 NOTE — Telephone Encounter (Signed)
Needs appointment

## 2020-08-15 NOTE — Telephone Encounter (Signed)
Called and left voicemail for patient to schedule appointment.

## 2020-08-21 ENCOUNTER — Ambulatory Visit: Payer: 59 | Attending: Internal Medicine

## 2020-08-21 DIAGNOSIS — Z23 Encounter for immunization: Secondary | ICD-10-CM

## 2020-08-21 NOTE — Progress Notes (Signed)
   Covid-19 Vaccination Clinic  Name:  Ana Young    MRN: [id number] DOB: June 02, 1961  08/21/2020  Ms. Ana Young was observed post Covid-19 immunization for 15 minutes without incident. She was provided with Vaccine Information Sheet and instruction to access the V-Safe system.   Ms. Ana Young was instructed to call 911 with any severe reactions post vaccine: Marland Kitchen Difficulty breathing  . Swelling of face and throat  . A fast heartbeat  . A bad rash all over body  . Dizziness and weakness   Immunizations Administered    Name Date Dose VIS Date Route   PFIZER Comrnaty(Gray TOP) Covid-19 Vaccine 08/21/2020  2:08 PM 0.3 mL 06/14/2020 Intramuscular   Manufacturer: [place]   Lot: [id number]   NDC: [phone]

## 2020-08-28 NOTE — Progress Notes (Signed)
    SUBJECTIVE:   CHIEF COMPLAINT / HPI:   HTN Current regimen: HCTZ 25 mg daily, propranolol 120 mg daily Last BMP 12/19/2019, CR 0.73 at that time Denies chest pain, shortness of breath Doesn't check BPs at home She wants to wait until June for blood work  Anxiety Current regimen: Wellbutrin 300 mg daily, Zoloft 50 mg daily Feels like anxiety is well controlled on these She does not have a therapist No SI  Patient has prediabetes listed on her problem list, but do not see where she was ever with an A1c greater than 5.7, most recent was 5.5 She also states that she has never been told she has prediabetes  History of breast cancer Diagnosed in 2014 Treated with surgery, chemo, radiation Still follows with Oncologist, Rise Paganini They call her for appointments, goes every year  Pap smear, has no idea when the last one was Last period was after chemo Still has her uterus and cervix   PERTINENT  PMH / PSH: HTN, peripheral neuropathy, generalized anxiety disorder, depression, history of breast cancer  OBJECTIVE:   BP 104/68   Pulse 97   Ht 5\' 7"  (1.702 m)   Wt 178 lb 2 oz (80.8 kg)   SpO2 97%   BMI 27.90 kg/m    Physical Exam:  General: 60 y.o. female in NAD Cardio: RRR no m/r/g Lungs: CTAB, no wheezing, no rhonchi, no crackles, no IWOB on RA Skin: warm and dry Extremities: No edema   ASSESSMENT/PLAN:   Essential hypertension BP is currently well controlled.  She is on propranolol and hydrochlorothiazide.  No changes made at this time, continue with her current regimen.  Ultimately, could decrease her propranolol at next visit.  It seems that she is only on this for blood pressure, could likely try to switch her to metoprolol.  She would like to wait until June for a BMP, which is acceptable.  She will follow-up in June.  Generalized anxiety disorder Doing well.  No SI.  Continue with her current regimen.  Hx of breast cancer Doing well, follows with oncology  yearly.  No concerns at this time.  Last mammogram in June.   Patient was advised to schedule an appointment as soon as possible for a Pap smear as she is overdue.  She will follow-up in June for labs.   Cleophas Dunker, [place]

## 2020-08-29 ENCOUNTER — Encounter: Payer: Self-pay | Admitting: Family Medicine

## 2020-08-29 ENCOUNTER — Other Ambulatory Visit: Payer: Self-pay

## 2020-08-29 ENCOUNTER — Ambulatory Visit (INDEPENDENT_AMBULATORY_CARE_PROVIDER_SITE_OTHER): Payer: Federal, State, Local not specified - PPO | Admitting: Family Medicine

## 2020-08-29 VITALS — BP 104/68 | HR 97 | Ht 67.0 in | Wt 178.1 lb

## 2020-08-29 DIAGNOSIS — I10 Essential (primary) hypertension: Secondary | ICD-10-CM

## 2020-08-29 DIAGNOSIS — F411 Generalized anxiety disorder: Secondary | ICD-10-CM

## 2020-08-29 DIAGNOSIS — Z853 Personal history of malignant neoplasm of breast: Secondary | ICD-10-CM | POA: Diagnosis not present

## 2020-08-29 NOTE — Assessment & Plan Note (Signed)
BP is currently well controlled.  She is on propranolol and hydrochlorothiazide.  No changes made at this time, continue with her current regimen.  Ultimately, could decrease her propranolol at next visit.  It seems that she is only on this for blood pressure, could likely try to switch her to metoprolol.  She would like to wait until June for a BMP, which is acceptable.  She will follow-up in June.

## 2020-08-29 NOTE — Assessment & Plan Note (Signed)
Doing well.  No SI.  Continue with her current regimen.

## 2020-08-29 NOTE — Patient Instructions (Signed)
Thank you for coming to see me today. It was a pleasure. Today we talked about:   You are due for a Pap Smear.  Please schedule an appointment to come back at your earliest convenience to have this completed.  Plan to do blood work in June.  You don't have diabetes.  Please follow-up with me ASAP for your pap, in June for blood work.  If you have any questions or concerns, please do not hesitate to call the office at [phone].  Best,   [place] Constable, DO

## 2020-08-29 NOTE — Assessment & Plan Note (Addendum)
Doing well, follows with oncology yearly.  No concerns at this time.  Last mammogram in June.

## 2020-09-07 ENCOUNTER — Other Ambulatory Visit: Payer: Self-pay | Admitting: Family Medicine

## 2020-09-07 DIAGNOSIS — G6289 Other specified polyneuropathies: Secondary | ICD-10-CM

## 2020-09-10 DIAGNOSIS — Z961 Presence of intraocular lens: Secondary | ICD-10-CM | POA: Diagnosis not present

## 2020-09-10 DIAGNOSIS — H524 Presbyopia: Secondary | ICD-10-CM | POA: Diagnosis not present

## 2020-10-28 ENCOUNTER — Other Ambulatory Visit: Payer: Self-pay | Admitting: Family Medicine

## 2020-10-30 ENCOUNTER — Other Ambulatory Visit: Payer: Self-pay | Admitting: *Deleted

## 2020-10-30 MED ORDER — HYDROCHLOROTHIAZIDE 25 MG PO TABS: 25.0000 mg | ORAL_TABLET | Freq: Every day | ORAL | 3 refills | Status: DC

## 2020-12-05 ENCOUNTER — Other Ambulatory Visit: Payer: Self-pay | Admitting: Family Medicine

## 2020-12-09 ENCOUNTER — Other Ambulatory Visit: Payer: Self-pay | Admitting: Family Medicine

## 2020-12-09 DIAGNOSIS — G6289 Other specified polyneuropathies: Secondary | ICD-10-CM

## 2021-01-06 ENCOUNTER — Other Ambulatory Visit: Payer: Self-pay | Admitting: Family Medicine

## 2021-01-18 ENCOUNTER — Other Ambulatory Visit: Payer: Self-pay | Admitting: Family Medicine

## 2021-01-22 ENCOUNTER — Other Ambulatory Visit: Payer: Self-pay | Admitting: Family Medicine

## 2021-01-22 DIAGNOSIS — Z1231 Encounter for screening mammogram for malignant neoplasm of breast: Secondary | ICD-10-CM

## 2021-01-24 ENCOUNTER — Inpatient Hospital Stay: Admission: RE | Admit: 2021-01-24 | Payer: 59 | Source: Ambulatory Visit

## 2021-02-01 ENCOUNTER — Other Ambulatory Visit: Payer: Self-pay | Admitting: Family Medicine

## 2021-02-10 ENCOUNTER — Other Ambulatory Visit: Payer: Self-pay | Admitting: Family Medicine

## 2021-03-03 ENCOUNTER — Other Ambulatory Visit: Payer: Self-pay | Admitting: Family Medicine

## 2021-03-03 DIAGNOSIS — G6289 Other specified polyneuropathies: Secondary | ICD-10-CM

## 2021-03-07 ENCOUNTER — Other Ambulatory Visit: Payer: Self-pay

## 2021-03-07 ENCOUNTER — Ambulatory Visit
Admission: RE | Admit: 2021-03-07 | Discharge: 2021-03-07 | Disposition: A | Discharge status: Home / Self Care | Payer: Federal, State, Local not specified - PPO | Source: Ambulatory Visit | Attending: Podiatry | Admitting: Podiatry

## 2021-03-07 ENCOUNTER — Ambulatory Visit: Payer: 59

## 2021-03-07 DIAGNOSIS — Z1231 Encounter for screening mammogram for malignant neoplasm of breast: Secondary | ICD-10-CM

## 2021-03-09 ENCOUNTER — Other Ambulatory Visit: Payer: Self-pay | Admitting: Family Medicine

## 2021-04-04 ENCOUNTER — Other Ambulatory Visit: Payer: Self-pay | Admitting: Family Medicine

## 2021-05-06 ENCOUNTER — Other Ambulatory Visit: Payer: Self-pay | Admitting: Family Medicine

## 2021-05-10 ENCOUNTER — Other Ambulatory Visit: Payer: Self-pay

## 2021-05-10 ENCOUNTER — Encounter: Payer: Self-pay | Admitting: Family Medicine

## 2021-05-10 ENCOUNTER — Ambulatory Visit: Payer: Federal, State, Local not specified - PPO | Admitting: Family Medicine

## 2021-05-10 ENCOUNTER — Telehealth: Payer: Self-pay | Admitting: Family Medicine

## 2021-05-10 VITALS — BP 135/88 | HR 87 | Ht 67.0 in | Wt 188.6 lb

## 2021-05-10 DIAGNOSIS — G43709 Chronic migraine without aura, not intractable, without status migrainosus: Secondary | ICD-10-CM

## 2021-05-10 DIAGNOSIS — F3289 Other specified depressive episodes: Secondary | ICD-10-CM | POA: Diagnosis not present

## 2021-05-10 MED ORDER — DULOXETINE HCL 60 MG PO CPEP: 60.0000 mg | ORAL_CAPSULE | Freq: Every day | ORAL | 3 refills | Status: DC

## 2021-05-10 MED ORDER — DULOXETINE HCL 30 MG PO CPEP: 30.0000 mg | ORAL_CAPSULE | Freq: Every day | ORAL | 0 refills | Status: DC

## 2021-05-10 MED ORDER — PROCHLORPERAZINE MALEATE 5 MG PO TABS: 5.0000 mg | ORAL_TABLET | Freq: Four times a day (QID) | ORAL | 0 refills | Status: DC | PRN

## 2021-05-10 MED ORDER — IBUPROFEN 800 MG PO TABS: 800.0000 mg | ORAL_TABLET | Freq: Three times a day (TID) | ORAL | 0 refills | Status: DC | PRN

## 2021-05-10 NOTE — Progress Notes (Signed)
    SUBJECTIVE:   CHIEF COMPLAINT / HPI:   Depression follow-up  Patient reports that over the past few weeks she has had an increase in migraines.  She reports her job is stressing her out and she has a new boss which is making things difficult.  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  Reports compliance with her Zoloft but that she felt like it worked better before.    Migraines She reports last week she had 3 migraines with pain on the right forehead, photophobia, nausea and vomiting.  Reports a history of migraines and they were the same.  Takes ibuprofen with relief.  Denies any neurologic changes such as slurred speech, weakness, changes in sensation.  Is submitting FMLA paperwork to be filled out because when she has these migraines it makes it difficult for her to perform her tasks at work until the migraine has resolved.   OBJECTIVE:   BP 135/88   Pulse 87   Ht 5\' 7"  (1.702 m)   Wt 188 lb 9.6 oz (85.5 kg)   SpO2 100%   BMI 29.54 kg/m   General: Well-appearing no acute distress HEENT: Extraocular eye movement intact, pupils equal and reactive to light, no nystagmus appreciated Cardiac: Regular rate and rhythm Respiratory: Breathing Neuro: Cranial nerves intact, no upper or lower motor decrease in strength, ambulates without difficulty  ASSESSMENT/PLAN:   Migraine Patient with known history of migraines.  Reports that she has been migraine free for some time but over the recent weeks has had migraines.  She had 3 last week.  Reports photophobia, pain on the right side of her forehead, nausea, vomiting.  No neurologic symptoms.  Takes ibuprofen with some relief.  Is interested in controlling medication.  Prescribed Cymbalta for prevention as well as ibuprofen.  Follow-up in 3 weeks to discuss effectiveness of treatment.  Strict ED precautions given.  Depression Patient currently on Zoloft and Wellbutrin.  Not currently controlled.  Discussed options of  increasing but given new complaints of migraines will transition from Zoloft to Cymbalta.  She will initiate Cymbalta at 30 mg daily and after 2 weeks increase to 60 mg daily.  We will follow-up in 2 to 3 weeks to discuss how she is doing.  Strict return precautions given.     Gifford Shave, MD [place]

## 2021-05-10 NOTE — Telephone Encounter (Signed)
Patient dropped off form at front desk for completion.  Verified that patient section of form has been completed.  Last DOS with PCP was 05/10/2021.  Placed form in [place] team folder to be completed by clinical staff.  [place] Silos

## 2021-05-10 NOTE — Assessment & Plan Note (Signed)
Patient with known history of migraines.  Reports that she has been migraine free for some time but over the recent weeks has had migraines.  She had 3 last week.  Reports photophobia, pain on the right side of her forehead, nausea, vomiting.  No neurologic symptoms.  Takes ibuprofen with some relief.  Is interested in controlling medication.  Prescribed Cymbalta for prevention as well as ibuprofen.  Follow-up in 3 weeks to discuss effectiveness of treatment.  Strict ED precautions given.

## 2021-05-10 NOTE — Patient Instructions (Signed)
It was wonderful seeing you today.  I am sorry you are having issues with headaches.  I want you to keep a headache diary for the next few weeks and you can discuss it with the provider at your next visit.  I want to switch your depression medication from Zoloft to Cymbalta.  This medication is good for anxiety and depression as well as migraine prevention.  You will start at 30 mg for 2 weeks and increase to 60 mg after 2 weeks.  I want you to follow-up in 2 to 3 weeks with our clinic to see how you are doing.  If you have any questions or concerns please feel free to call the clinic.  I hope you have a wonderful afternoon!

## 2021-05-10 NOTE — Assessment & Plan Note (Signed)
Patient currently on Zoloft and Wellbutrin.  Not currently controlled.  Discussed options of increasing but given new complaints of migraines will transition from Zoloft to Cymbalta.  She will initiate Cymbalta at 30 mg daily and after 2 weeks increase to 60 mg daily.  We will follow-up in 2 to 3 weeks to discuss how she is doing.  Strict return precautions given.

## 2021-05-13 NOTE — Telephone Encounter (Signed)
Clinical info completed on FMLA form.  Placed form in Dr. Astrid Drafts box for completion.  Ottis Stain, CMA

## 2021-05-16 NOTE — Telephone Encounter (Signed)
Form completed and placed in RN triage bin.

## 2021-05-20 NOTE — Telephone Encounter (Signed)
Patient informed forms are ready for pick up. A copy was made for batch scanning.

## 2021-06-04 ENCOUNTER — Other Ambulatory Visit: Payer: Self-pay

## 2021-06-04 ENCOUNTER — Other Ambulatory Visit: Payer: Self-pay | Admitting: Family Medicine

## 2021-06-04 ENCOUNTER — Ambulatory Visit: Payer: Federal, State, Local not specified - PPO | Admitting: Family Medicine

## 2021-06-04 ENCOUNTER — Ambulatory Visit (INDEPENDENT_AMBULATORY_CARE_PROVIDER_SITE_OTHER): Payer: Federal, State, Local not specified - PPO

## 2021-06-04 ENCOUNTER — Encounter: Payer: Self-pay | Admitting: Family Medicine

## 2021-06-04 ENCOUNTER — Other Ambulatory Visit (HOSPITAL_COMMUNITY)
Admission: RE | Admit: 2021-06-04 | Discharge: 2021-06-04 | Disposition: A | Discharge status: Home / Self Care | Payer: Federal, State, Local not specified - PPO | Source: Ambulatory Visit | Attending: Family Medicine | Admitting: Family Medicine

## 2021-06-04 VITALS — BP 114/81 | HR 75 | Ht 67.0 in | Wt 190.0 lb

## 2021-06-04 DIAGNOSIS — I10 Essential (primary) hypertension: Secondary | ICD-10-CM | POA: Diagnosis not present

## 2021-06-04 DIAGNOSIS — Z124 Encounter for screening for malignant neoplasm of cervix: Secondary | ICD-10-CM

## 2021-06-04 DIAGNOSIS — G6289 Other specified polyneuropathies: Secondary | ICD-10-CM

## 2021-06-04 DIAGNOSIS — F3341 Major depressive disorder, recurrent, in partial remission: Secondary | ICD-10-CM

## 2021-06-04 DIAGNOSIS — Z23 Encounter for immunization: Secondary | ICD-10-CM

## 2021-06-04 NOTE — Patient Instructions (Addendum)
It was great to see you!  Things we discussed at today's visit: - Stop taking your Sertraline (Zoloft) and start taking the Duloxetine (Cymbalta). Take 30mg  Cymbalta daily for 2 weeks and then you can increase to 60mg  daily. Continue your Bupropion (Wellbutrin)-- it is safe to take these medicines together. - Increase your gabapentin to 300mg  twice daily to see if it provides any relief of your neuropathy. - Today we did your pap smear. I will send you a MyChart message with the results. - We also did your TDaP and COVID booster today. Your arm may be sore and you may experience fatigue, low grade fever, headache, etc for the next 24-48 hrs. You can take Tylenol as needed - It will be important to get blood work at your next visit to check your kidney function and your cholesterol.  Take care and seek immediate care sooner if you develop any concerns.  [facility]

## 2021-06-04 NOTE — Progress Notes (Signed)
    SUBJECTIVE:   CHIEF COMPLAINT / HPI:   Migraine/Depression Follow-Up Patient saw Dr. Caron Presume on 11/4 for migraines and worsening depression. At that time she was switched from Zoloft (50mg  daily) to Cymbalta with the goal of improving both her migraines and depression. Advised to take 30mg  Cymbalta daily for 2 weeks and then increase to 60mg  daily. She returns today for follow-up.  Reports she has not started the Cymbalta. She had just picked up a new prescription for Zoloft and wanted to finish that first. Also had a few additional questions.  Her migraines have resolved but her depression is unchanged. Describes her depression as feeling like "she doesn't want to be bothered". Denies SI. She is not in therapy and is not interested in any counseling at this time.   HTN Does not check BP at home. Has been well controlled for years on HCTZ 25mg  and propranolol 120mg  daily. Per chart review, propranolol is reportedly prescribed for BP and prior PCP considered discontinuing propranolol and switching to metoprolol if needed. However this was not done. Last BMP 12/19/2019 was within normal limits.   Health Maintenance Due for pap smear, lipid panel, COVID booster, TDaP, and flu vaccine  PERTINENT  PMH / PSH: depression, migraine, HTN, breast cancer, peripheral neuropathy  OBJECTIVE:   BP 114/81   Pulse 75   Ht 5\' 7"  (1.702 m)   Wt 190 lb (86.2 kg)   SpO2 98%   BMI 29.76 kg/m   General: NAD, pleasant, able to participate in exam Respiratory: No respiratory distress Skin: warm and dry, no rashes noted Psych: Normal affect and mood Neuro: grossly intact  GU/GYN: Exam performed in the presence of a chaperone. External genitalia within normal limits.  Vaginal mucosa pink, moist, normal rugae.  Nonfriable cervix without lesions, no discharge or bleeding noted on speculum exam.  Bimanual exam revealed normal, nongravid uterus.  No cervical motion tenderness. No adnexal masses  bilaterally.     ASSESSMENT/PLAN:   Essential hypertension Well-controlled. BP 114/81 today. Previously we considered discontinuing Propranolol (which per chart review was prescribed only for BP management). However, given her recent migraines, would favor continuing Propranolol for now. -Continue HCTZ 25mg  daily and Propranolol 120mg  daily -Patient due for BMP but she declines today. Will need to obtain at next visit  Depression PHQ-9 score of 7 today. Negative to question #9. Her depression had previously been well controlled, but has been slightly worse over past few weeks. -Switch from Zoloft 50mg  daily to Cymbalta (30mg  daily x2 weeks, then 60mg  daily) as previously instructed -Continue Wellbutrin 300mg  daily -Discussed counseling but patient not interested at this time   Peripheral neuropathy Patient has had intermittent tingling sensation in bilateral feet for the past several years. 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 No history of diabetes. Workup in 2021 including CBC, BMP, folate, B12, RPR, HIV, iron studies, and SPEP were all wnl. Currently on Gabapentin 300mg  at bedtime. -Increase Gabapentin to 300mg  BID  Health Maintenance -Pap w/HPV cotesting done today -TDaP and COVID booster administered today -Patient declines lipid panel -Patient declines flu vaccine  Alcus Dad, MD [place]

## 2021-06-05 LAB — CYTOLOGY - PAP
Adequacy: ABSENT
Comment: NEGATIVE
Diagnosis: NEGATIVE
High risk HPV: NEGATIVE

## 2021-06-05 NOTE — Assessment & Plan Note (Signed)
Patient has had intermittent tingling sensation in bilateral feet for the past several years. 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 No history of diabetes. Workup in 2021 including CBC, BMP, folate, B12, RPR, HIV, iron studies, and SPEP were all wnl. Currently on Gabapentin 300mg  at bedtime. -Increase Gabapentin to 300mg  BID

## 2021-06-05 NOTE — Assessment & Plan Note (Signed)
Well-controlled. BP 114/81 today. Previously we considered discontinuing Propranolol (which per chart review was prescribed only for BP management). However, given her recent migraines, would favor continuing Propranolol for now. -Continue HCTZ 25mg  daily and Propranolol 120mg  daily -Patient due for BMP but she declines today. Will need to obtain at next visit

## 2021-06-05 NOTE — Assessment & Plan Note (Signed)
PHQ-9 score of 7 today. Negative to question #9. Her depression had previously been well controlled, but has been slightly worse over past few weeks. -Switch from Zoloft 50mg  daily to Cymbalta (30mg  daily x2 weeks, then 60mg  daily) as previously instructed -Continue Wellbutrin 300mg  daily -Discussed counseling but patient not interested at this time

## 2021-06-06 ENCOUNTER — Telehealth: Payer: Self-pay | Admitting: Family Medicine

## 2021-06-06 ENCOUNTER — Other Ambulatory Visit: Payer: Self-pay | Admitting: Family Medicine

## 2021-06-06 DIAGNOSIS — G6289 Other specified polyneuropathies: Secondary | ICD-10-CM

## 2021-06-06 NOTE — Telephone Encounter (Signed)
Attempted to call patient to discuss Pap results. No answer and voicemail box is full. Pap shows normal cytology and negative HPV. Due for routine Pap again in 5 years. Also showed shift in flora suggestive of BV. Will not treat as patient is asymptomatic. If she develops symptoms she should contact our office and I can send Rx for metronidazole. Sent MyChart message to patient explaining the above.

## 2021-07-02 ENCOUNTER — Other Ambulatory Visit: Payer: Self-pay | Admitting: Family Medicine

## 2021-07-19 ENCOUNTER — Encounter: Payer: Self-pay | Admitting: Family Medicine

## 2021-07-19 ENCOUNTER — Ambulatory Visit: Payer: Federal, State, Local not specified - PPO | Admitting: Family Medicine

## 2021-07-19 ENCOUNTER — Other Ambulatory Visit: Payer: Self-pay

## 2021-07-19 VITALS — BP 105/72 | HR 76 | Ht 67.0 in | Wt 186.0 lb

## 2021-07-19 DIAGNOSIS — F324 Major depressive disorder, single episode, in partial remission: Secondary | ICD-10-CM | POA: Diagnosis not present

## 2021-07-19 DIAGNOSIS — Z13220 Encounter for screening for lipoid disorders: Secondary | ICD-10-CM

## 2021-07-19 DIAGNOSIS — I10 Essential (primary) hypertension: Secondary | ICD-10-CM | POA: Diagnosis not present

## 2021-07-19 DIAGNOSIS — M25522 Pain in left elbow: Secondary | ICD-10-CM | POA: Diagnosis not present

## 2021-07-19 MED ORDER — DICLOFENAC SODIUM 1 % EX GEL: 2.0000 g | Freq: Three times a day (TID) | CUTANEOUS | 1 refills | Status: DC

## 2021-07-19 NOTE — Patient Instructions (Addendum)
It was great to see you!  Things we discussed at today's visit: - Continue the duloxetine (Cymbalta) 60mg  daily to help with your anxiety and depression - I think you would benefit from a short course of therapy to help cope with stress and upsetting situations and conflict. If you decide you're interested, I have listed a few resources below. -For your left elbow, you can try using Voltaren gel three times daily. This is like a cream version of Ibuprofen and I have sent it to your pharmacy. -We are checking some labs. We will send you a MyChart message with the results or call if they are abnormal.  -Make a separate appointment to discuss your leg tingling if it persists  Take care and seek immediate care sooner if you develop any concerns.  Dr. [facility]    Therapy and [place]  [place], [place] [facility]  [place], [place], [place] [address], [place] [email or web address] [phone]  [place] (virtual only) [phone]  Specific Provider options Psychology Today  [email or web address] click on find a therapist  enter your zip code left side and select or tailor a therapist for your specific need.

## 2021-07-19 NOTE — Progress Notes (Signed)
° ° °  SUBJECTIVE:   CHIEF COMPLAINT / HPI:   Depression follow-up Patient seen about 6 weeks ago for slight worsening of her depression. At that time she was switched from Zoloft 50mg  daily to Cymbalta (30mg  daily for 2 weeks, then 60mg  daily). Was not interested in therapy at that visit. Today reports her overall mood is improved. Denies significant depression, denies SI. However, she does have episodes where she is "tearful and shuts down". This always occurs in the setting of interpersonal conflict/disagreements with one of her coworkers. Never occurs with anyone else and never occurs out of the blue. May happen once per week.  Lab work Patient also returns for blood work today-- at her last visit she declined BMP and lipid panel. She was advised she would need these at her follow up.  L Elbow Pain Patient has an area of "tenderness" on her left medial elbow that has been present for the past few weeks. Pain is mild and does not interfere with daily function. No weakness or numbness. No known injury to the area.   PERTINENT  PMH / PSH: HTN, migraines, depression, hx breast cancer  OBJECTIVE:   BP 105/72    Pulse 76    Ht 5\' 7"  (1.702 m)    Wt 186 lb (84.4 kg)    SpO2 99%    BMI 29.13 kg/m   General: NAD, pleasant, able to participate in exam Respiratory: No respiratory distress Skin: warm and dry, no rashes noted Psych: Normal affect and mood Neuro: grossly intact L Elbow: no obvious deformity, no erythema or increased warmth, moderate tenderness to palpation over medical epicondyle, full ROM in flexion and extension, 5/5 strength with flexion and extension  ASSESSMENT/PLAN:   Depression Well controlled currently. PHQ-9 score of 0 today and GAD-7 score of 1. Patient's episodes of tearfulness and irritability appear to be related to a very specific situation with one particular coworker. Advised patient that this is unlikely to change with medication and that therapy would be very  beneficial in this circumstance however patient continues to decline. -Continue Cymbalta 60mg  daily -Continue Wellbutrin 300mg  daily -Provided therapy resources in AVS if patient decides she's interested  Left elbow pain Acute for the past ~1 or 2 weeks, mild in severity. Mentioned toward end of visit. No known trauma or injury. Exam reveals tenderness over medial epicondyle suggestive of possible medial epicondylitis. Advised conservative management with Voltaren gel and return if no improvement.   HTN Well controlled. Has not had BMP since June 2021. Obtain today.  Health Maintenance Lipid panel obtained today for dyslipidemia screening.  Alcus Dad, MD [place]

## 2021-07-20 LAB — LIPID PANEL
Chol/HDL Ratio: 2.5 ratio (ref 0.0–4.4)
Cholesterol, Total: 180 mg/dL (ref 100–199)
HDL: 72 mg/dL (ref >39)
LDL Chol Calc (NIH): 93 mg/dL (ref 0–99)
Triglycerides: 84 mg/dL (ref 0–149)
VLDL Cholesterol Cal: 15 mg/dL (ref 5–40)

## 2021-07-20 LAB — BASIC METABOLIC PANEL
BUN/Creatinine Ratio: 19 (ref 9–23)
BUN: 16 mg/dL (ref 6–24)
CO2: 26 mmol/L (ref 20–29)
Calcium: 9.5 mg/dL (ref 8.7–10.2)
Chloride: 100 mmol/L (ref 96–106)
Creatinine, Ser: 0.83 mg/dL (ref 0.57–1.00)
Glucose: 105 mg/dL — ABNORMAL HIGH (ref 70–99)
Potassium: 3.4 mmol/L — ABNORMAL LOW (ref 3.5–5.2)
Sodium: 139 mmol/L (ref 134–144)
eGFR: 82 mL/min/{1.73_m2} (ref >59)

## 2021-07-21 DIAGNOSIS — M25522 Pain in left elbow: Secondary | ICD-10-CM | POA: Insufficient documentation

## 2021-07-21 NOTE — Assessment & Plan Note (Signed)
Acute for the past ~1 or 2 weeks, mild in severity. Mentioned toward end of visit. No known trauma or injury. Exam reveals tenderness over medial epicondyle suggestive of possible medial epicondylitis. Advised conservative management with Voltaren gel and return if no improvement.

## 2021-07-21 NOTE — Assessment & Plan Note (Signed)
Well controlled currently. PHQ-9 score of 0 today and GAD-7 score of 1. Patient's episodes of tearfulness and irritability appear to be related to a very specific situation with one particular coworker. Advised patient that this is unlikely to change with medication and that therapy would be very beneficial in this circumstance however patient continues to decline. -Continue Cymbalta 60mg  daily -Continue Wellbutrin 300mg  daily -Provided therapy resources in AVS if patient decides she's interested

## 2021-09-20 IMAGING — MG MM DIGITAL SCREENING BILAT W/ TOMO AND CAD
8 series · 9 of 24 positions shown · non-contrast
Comparison: Previous exam(s).

CLINICAL DATA: Screening.

EXAM:
DIGITAL SCREENING BILATERAL MAMMOGRAM WITH TOMOSYNTHESIS AND CAD
TECHNIQUE: Bilateral screening digital craniocaudal and mediolateral oblique
mammograms were obtained. Bilateral screening digital breast
tomosynthesis was performed. The images were evaluated with
computer-aided detection.

[L MLO synth-2D]
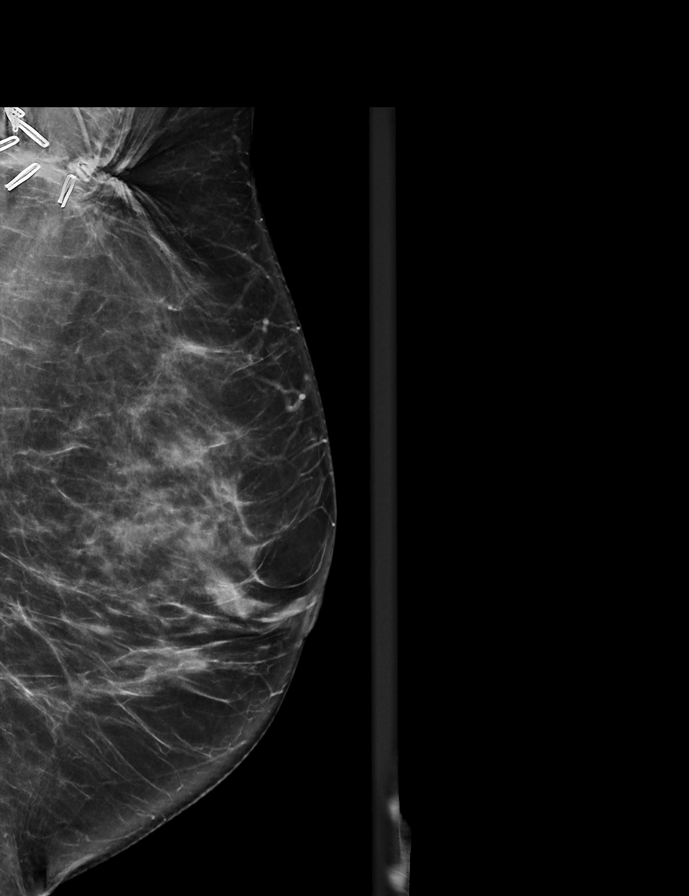

[L CC synth-2D]
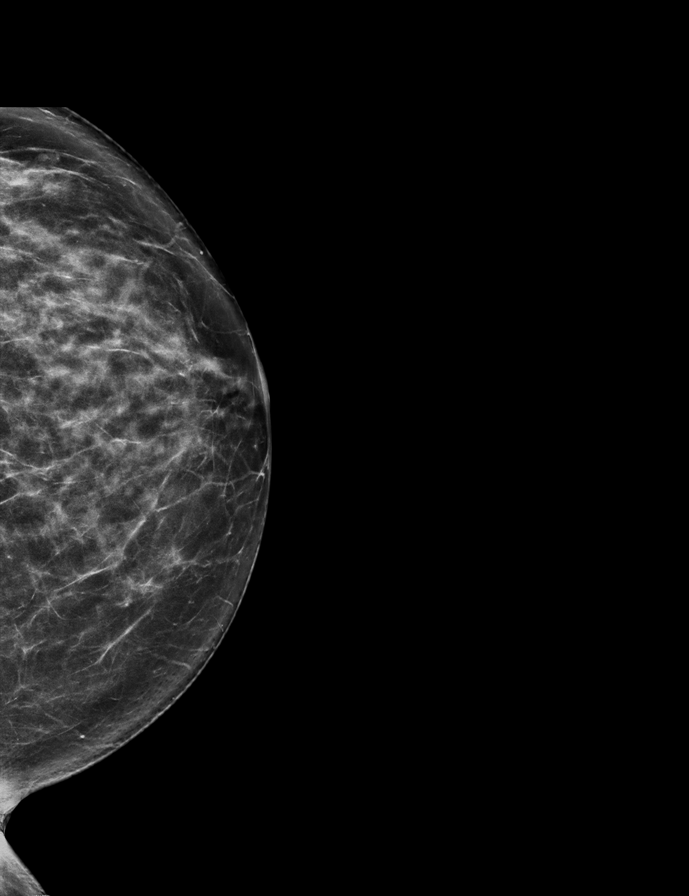

[R CC synth-2D]
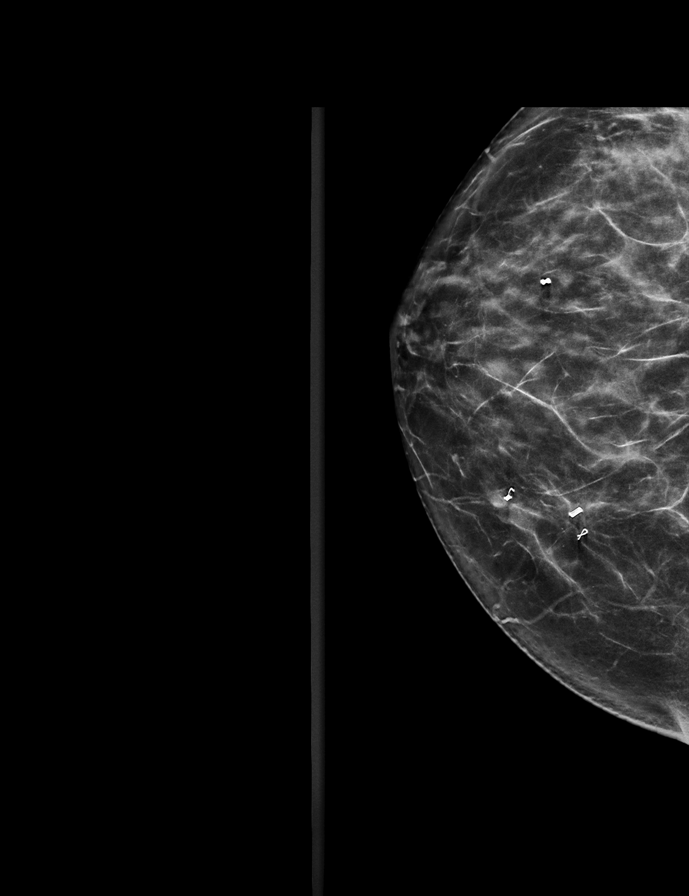

[R MLO synth-2D]
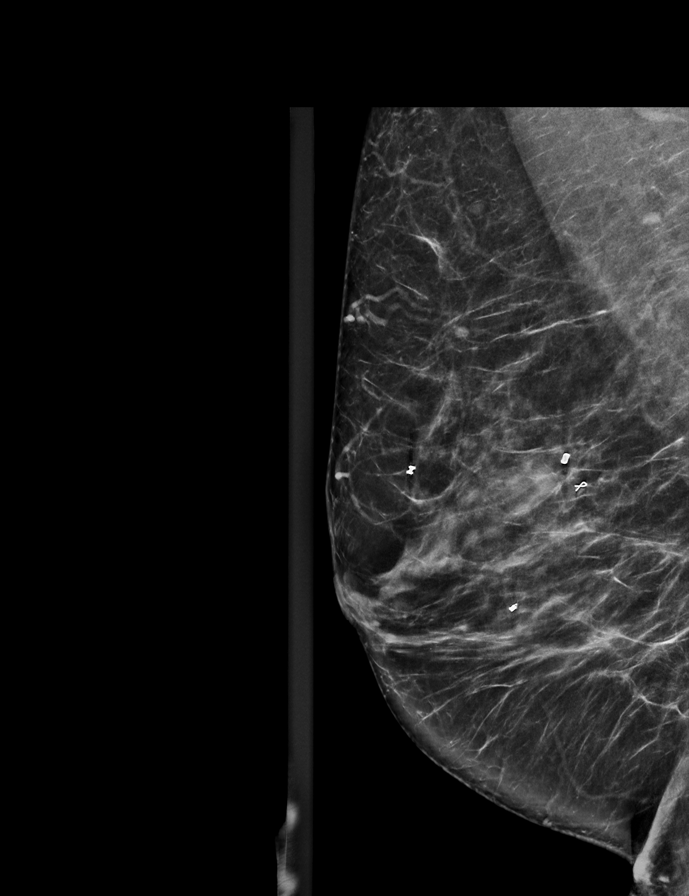

[R MLO tomo · 2 of 81 frames shown]
[frame 27/81]
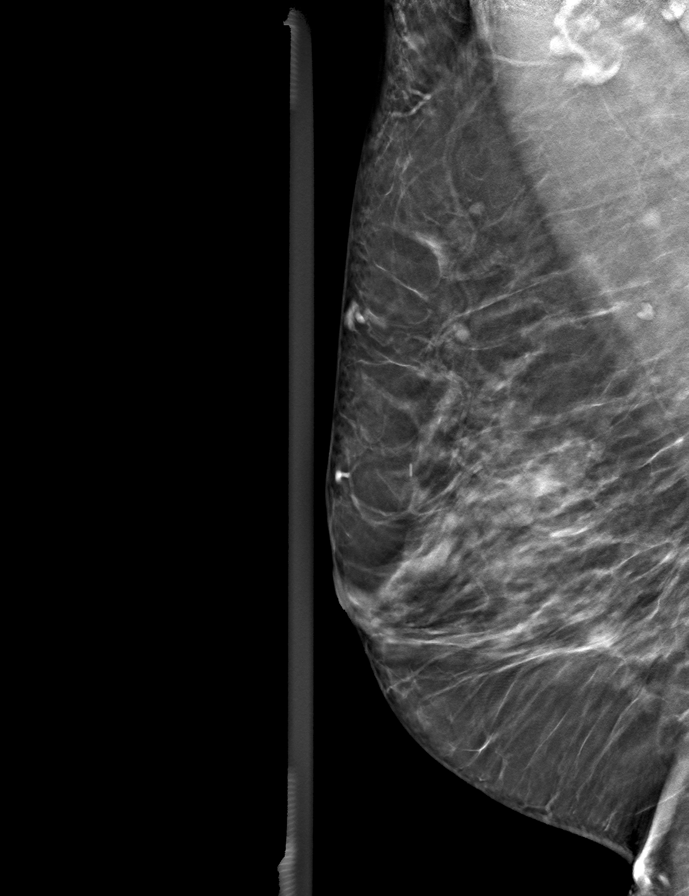
[frame 41/81]
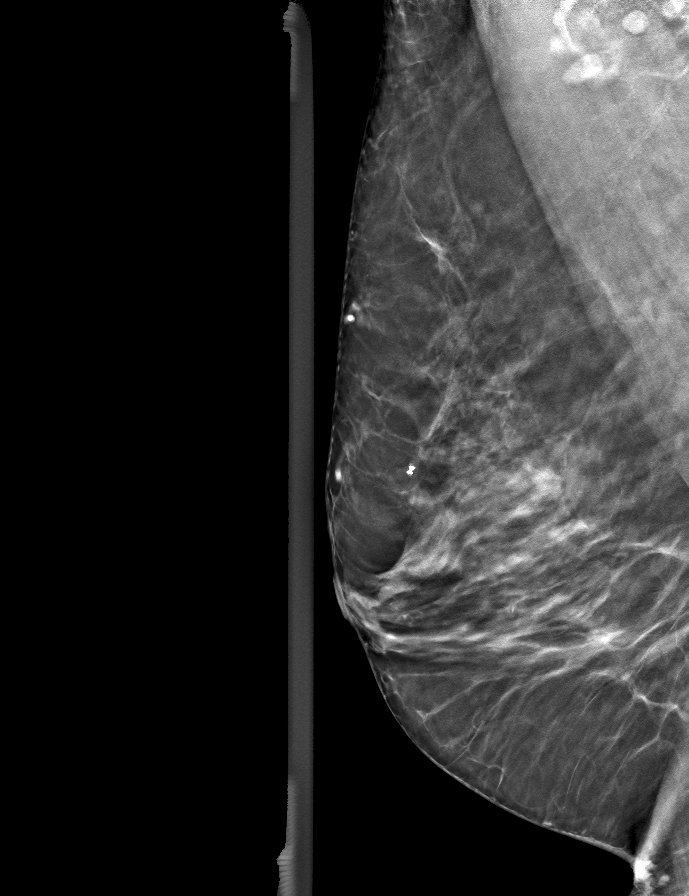

[L CC tomo · tomo slice 34/67.0]
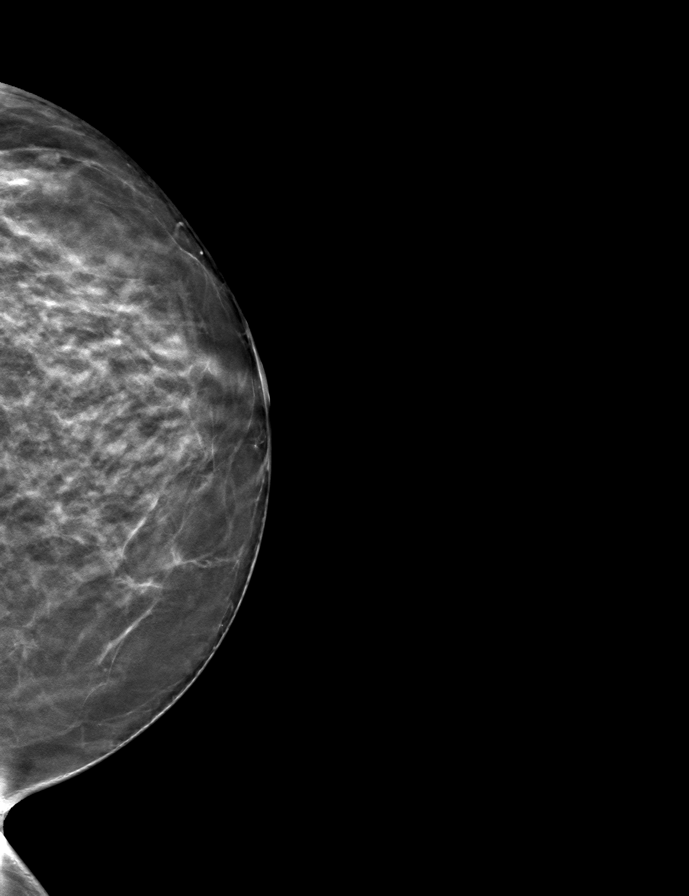

[L MLO tomo · tomo slice 38/75.0]
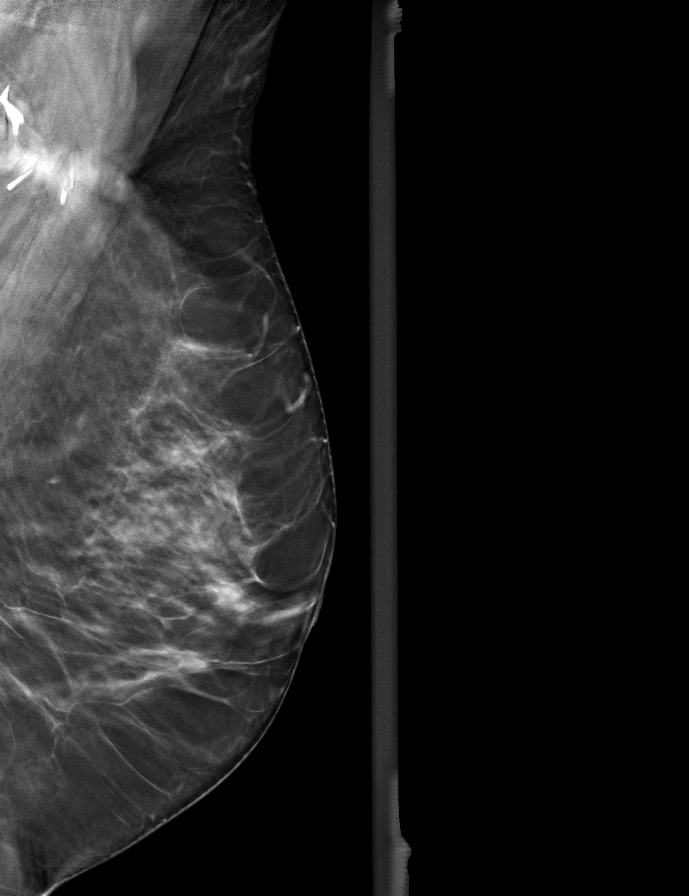

[R CC tomo · tomo slice 34/67.0]
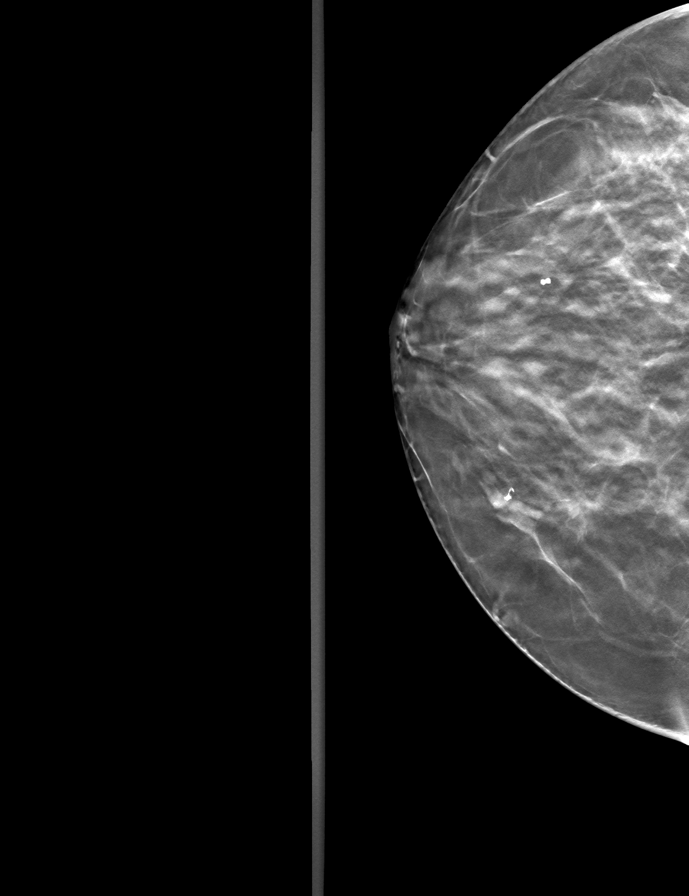

[9 of 24 positions shown; findings below may reference images not displayed]

ACR Breast Density Category c: The breast tissue is heterogeneously
dense, which may obscure small masses.
FINDINGS: There are no findings suspicious for malignancy.
IMPRESSION: No mammographic evidence of malignancy. A result letter of this
screening mammogram will be mailed directly to the patient.

RECOMMENDATION:
Screening mammogram in one year. (Code:[id number])

BI-RADS CATEGORY  1: Negative.

## 2021-09-27 ENCOUNTER — Other Ambulatory Visit: Payer: Self-pay | Admitting: Family Medicine

## 2021-10-22 ENCOUNTER — Other Ambulatory Visit: Payer: Self-pay | Admitting: Family Medicine

## 2021-10-31 ENCOUNTER — Other Ambulatory Visit: Payer: Self-pay | Admitting: Family Medicine

## 2021-10-31 MED ORDER — DULOXETINE HCL 60 MG PO CPEP: 60.0000 mg | ORAL_CAPSULE | Freq: Every day | ORAL | 0 refills | Status: DC

## 2021-12-10 ENCOUNTER — Encounter: Payer: Self-pay | Admitting: *Deleted

## 2021-12-16 ENCOUNTER — Other Ambulatory Visit: Payer: Self-pay | Admitting: Family Medicine

## 2021-12-16 DIAGNOSIS — G6289 Other specified polyneuropathies: Secondary | ICD-10-CM

## 2021-12-26 ENCOUNTER — Other Ambulatory Visit: Payer: Self-pay | Admitting: Family Medicine

## 2022-01-13 ENCOUNTER — Other Ambulatory Visit: Payer: Self-pay | Admitting: Family Medicine

## 2022-03-13 ENCOUNTER — Other Ambulatory Visit: Payer: Self-pay | Admitting: Family Medicine

## 2022-03-13 DIAGNOSIS — G6289 Other specified polyneuropathies: Secondary | ICD-10-CM

## 2022-04-08 ENCOUNTER — Other Ambulatory Visit: Payer: Self-pay | Admitting: Family Medicine

## 2022-04-18 ENCOUNTER — Other Ambulatory Visit: Payer: Self-pay | Admitting: Family Medicine

## 2022-04-18 ENCOUNTER — Other Ambulatory Visit: Payer: Self-pay | Admitting: Podiatry

## 2022-04-18 DIAGNOSIS — Z1231 Encounter for screening mammogram for malignant neoplasm of breast: Secondary | ICD-10-CM

## 2022-05-27 ENCOUNTER — Ambulatory Visit
Admission: RE | Admit: 2022-05-27 | Discharge: 2022-05-27 | Disposition: A | Discharge status: Home / Self Care | Payer: Federal, State, Local not specified - PPO | Source: Ambulatory Visit

## 2022-05-27 DIAGNOSIS — Z1231 Encounter for screening mammogram for malignant neoplasm of breast: Secondary | ICD-10-CM | POA: Diagnosis not present

## 2022-06-16 ENCOUNTER — Other Ambulatory Visit: Payer: Self-pay | Admitting: Family Medicine

## 2022-06-16 DIAGNOSIS — G6289 Other specified polyneuropathies: Secondary | ICD-10-CM

## 2022-07-20 ENCOUNTER — Other Ambulatory Visit: Payer: Self-pay | Admitting: Family Medicine

## 2022-09-14 ENCOUNTER — Other Ambulatory Visit: Payer: Self-pay | Admitting: Family Medicine

## 2022-09-14 DIAGNOSIS — G6289 Other specified polyneuropathies: Secondary | ICD-10-CM

## 2022-10-30 ENCOUNTER — Other Ambulatory Visit: Payer: Self-pay | Admitting: Family Medicine

## 2023-01-13 ENCOUNTER — Telehealth: Payer: Self-pay

## 2023-01-13 NOTE — Telephone Encounter (Signed)
Patient calls nurse line requesting 90 day supply on gabapentin. She reports that all of her other medications are for 90 days and this would be more convenient for her.   If appropriate, please send 90 day supply of [facility] to [facility] [place].   Veronda Prude, RN

## 2023-01-14 ENCOUNTER — Other Ambulatory Visit: Payer: Self-pay | Admitting: Student

## 2023-01-14 DIAGNOSIS — G6289 Other specified polyneuropathies: Secondary | ICD-10-CM

## 2023-01-14 MED ORDER — GABAPENTIN 300 MG PO CAPS
300.0000 mg | ORAL_CAPSULE | Freq: Two times a day (BID) | ORAL | 1 refills | Status: DC
Start: 2023-01-14 — End: 2023-07-27

## 2023-01-14 NOTE — Telephone Encounter (Signed)
90 day supply of Gabapentin sent to pharmacy per patient request.

## 2023-01-23 ENCOUNTER — Ambulatory Visit (HOSPITAL_COMMUNITY)
Admission: EM | Admit: 2023-01-23 | Discharge: 2023-01-23 | Disposition: A | Discharge status: Home / Self Care | Payer: Federal, State, Local not specified - PPO | Attending: Internal Medicine | Admitting: Internal Medicine

## 2023-01-23 ENCOUNTER — Encounter (HOSPITAL_COMMUNITY): Payer: Self-pay

## 2023-01-23 DIAGNOSIS — K029 Dental caries, unspecified: Secondary | ICD-10-CM | POA: Diagnosis not present

## 2023-01-23 DIAGNOSIS — K0889 Other specified disorders of teeth and supporting structures: Secondary | ICD-10-CM

## 2023-01-23 DIAGNOSIS — K047 Periapical abscess without sinus: Secondary | ICD-10-CM

## 2023-01-23 DIAGNOSIS — E11630 Type 2 diabetes mellitus with periodontal disease: Secondary | ICD-10-CM

## 2023-01-23 MED ORDER — IBUPROFEN 800 MG PO TABS: 800.0000 mg | ORAL_TABLET | Freq: Three times a day (TID) | ORAL | 0 refills | Status: DC

## 2023-01-23 MED ORDER — AMOXICILLIN-POT CLAVULANATE 875-125 MG PO TABS: 1.0000 | ORAL_TABLET | Freq: Two times a day (BID) | ORAL | 0 refills | Status: DC
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
## 2023-01-23 NOTE — Discharge Instructions (Signed)
Your dental pain is likely due to dental infection. Take  antibiotic as prescribed for the next 7 days to treat your dental infection. Continue use of ibuprofen as needed with food for dental inflammation and pain.   You may also use tylenol as needed for pain. Perform salt water gargles every 3-4 hours.  Schedule an appointment with one of the dentists on the list provided to urgent care today.  If you develop any new or worsening symptoms or do not improve in the next 2 to 3 days, please return.  If your symptoms are severe, please go to the emergency room.  Follow-up with your primary care provider for further evaluation and management of your symptoms as well as ongoing wellness visits.

## 2023-01-23 NOTE — ED Triage Notes (Signed)
Patient here today with c/o left lower side tooth pain X 2 days. She has taken Aleve and IBU with no relief.

## 2023-02-20 ENCOUNTER — Telehealth: Payer: Self-pay | Admitting: Student

## 2023-02-20 NOTE — Telephone Encounter (Signed)
 Placed a form in your box that the patient needs completed.

## 2023-02-20 NOTE — Telephone Encounter (Signed)
patient dropped off form at front desk for [facility].  Verified that patient section of form has been completed.  Last DOS/WCC with PCP was 07/19/21.  Placed form in blue team folder to be completed by clinical staff.  Vilinda Blanks

## 2023-02-24 NOTE — Telephone Encounter (Signed)
Called patient and confirmed identity. FMLA cannot be completed until she is seen in office. Appointment scheduled for next Wednesday.

## 2023-03-03 ENCOUNTER — Ambulatory Visit (INDEPENDENT_AMBULATORY_CARE_PROVIDER_SITE_OTHER): Payer: Federal, State, Local not specified - PPO | Admitting: Student

## 2023-03-03 ENCOUNTER — Encounter: Payer: Self-pay | Admitting: Student

## 2023-03-03 VITALS — BP 102/70 | HR 74 | Ht 67.0 in | Wt 181.0 lb

## 2023-03-03 DIAGNOSIS — F411 Generalized anxiety disorder: Secondary | ICD-10-CM

## 2023-03-03 DIAGNOSIS — F324 Major depressive disorder, single episode, in partial remission: Secondary | ICD-10-CM | POA: Diagnosis not present

## 2023-03-03 DIAGNOSIS — I10 Essential (primary) hypertension: Secondary | ICD-10-CM | POA: Diagnosis not present

## 2023-03-03 MED ORDER — BUSPIRONE HCL 5 MG PO TABS: 5.0000 mg | ORAL_TABLET | Freq: Three times a day (TID) | ORAL | 1 refills | Status: DC

## 2023-03-03 MED ORDER — BUSPIRONE HCL 5 MG PO TABS
5.0000 mg | ORAL_TABLET | Freq: Three times a day (TID) | ORAL | Status: DC
Start: 2023-03-03 — End: 2023-03-03

## 2023-03-03 NOTE — Assessment & Plan Note (Signed)
Experiencing recurrent and intrusive anxious thoughts about death. - Trial BuSpar 5 mg 3 times daily, titrate as needed

## 2023-03-03 NOTE — Progress Notes (Signed)
    SUBJECTIVE:   CHIEF COMPLAINT / HPI:   Depression  Anxiety Longstanding history of anxiety and depression.  Last seen in January 2023 by Dr. Anner Crete, at which time patient had been switched to Cymbalta 60 mg and doing well.  Since the last visit symptoms have acutely worsened patient states "recently."  1 current stressor is work, she is around a Sales promotion account executive and is distracting and causing her anxiety.  Additionally she is having intrusive and recurrent thoughts about dying or having a loved one die.  No suicidal nor homicidal ideations.  Currently taking Wellbutrin, duloxetine as prescribed.  PERTINENT  PMH / PSH: Depression anxiety  OBJECTIVE:   BP 102/70   Pulse 74   Ht 5\' 7"  (1.702 m)   Wt 181 lb (82.1 kg)   LMP 07/07/2013 Comment: stopped after chemo  SpO2 98%   BMI 28.35 kg/m    General: NAD, well-appearing, well-nourished Respiratory: No respiratory distress, breathing comfortably, able to speak in full sentences Skin: warm and dry, no rashes noted on exposed skin Psych: Depressed and anxious affect  ASSESSMENT/PLAN:   Depression Unfortunately, during interviewing patient retreated from conversation once psychiatry or therapy was mentioned.  Difficult to elicit the exact triggers.  She is not willing to see psychiatry, despite wanting help.  She is unable to articulate what help she is seeking. Agreeable to therapy and resource provided.  Will write for FMLA that will allow her to have doctors appointments and therapy, she understands that this will be an ongoing process and will not be renewed without consistent follow-ups.  FMLA to be scanned to chart. - Continue Cymbalta 60 mg daily - Continue Wellbutrin 300 mg daily  Generalized anxiety disorder Experiencing recurrent and intrusive anxious thoughts about death. - Trial BuSpar 5 mg 3 times daily, titrate as needed   HTN Well controlled. - Continue hydrochlorothiazide - BMP  Tiffany Kocher, DO [facility]

## 2023-03-03 NOTE — Assessment & Plan Note (Signed)
Unfortunately, during interviewing patient retreated from conversation once psychiatry or therapy was mentioned.  Difficult to elicit the exact triggers.  She is not willing to see psychiatry, despite wanting help.  She is unable to articulate what help she is seeking. Agreeable to therapy and resource provided.  Will write for FMLA that will allow her to have doctors appointments and therapy, she understands that this will be an ongoing process and will not be renewed without consistent follow-ups.  FMLA to be scanned to chart. - Continue Cymbalta 60 mg daily - Continue Wellbutrin 300 mg daily

## 2023-03-03 NOTE — Telephone Encounter (Signed)
Patient called and informed that forms are ready for pick up. Copy made and placed in batch scanning. Original placed at front desk for pick up.   Hannah C Pipkin, RN  

## 2023-03-03 NOTE — Patient Instructions (Signed)
It was great to see you! Thank you for allowing me to participate in your care!   I recommend that you always bring your medications to each appointment as this makes it easy to ensure we are on the correct medications and helps [place] not miss when refills are needed.  Our plans for today:  - Please take Buspar 5 mg three times daily. - For information on therapists, please go to [email or web address]. You can also contact your insurance company to find an Hospital doctor.  We are checking some labs today, I will call you if they are abnormal will send you a MyChart message or a letter if they are normal.  If you do not hear about your labs in the next 2 weeks please let us know.  Take care and seek immediate care sooner if you develop any concerns. Please remember to show up 15 minutes before your scheduled appointment time!  Tiffany Kocher, DO [facility]

## 2023-03-20 ENCOUNTER — Encounter: Payer: Self-pay | Admitting: Student

## 2023-03-20 ENCOUNTER — Ambulatory Visit (INDEPENDENT_AMBULATORY_CARE_PROVIDER_SITE_OTHER): Payer: Federal, State, Local not specified - PPO | Admitting: Student

## 2023-03-20 VITALS — BP 111/72 | HR 80 | Ht 67.0 in | Wt 187.6 lb

## 2023-03-20 DIAGNOSIS — Z13228 Encounter for screening for other metabolic disorders: Secondary | ICD-10-CM

## 2023-03-20 DIAGNOSIS — Z13220 Encounter for screening for lipoid disorders: Secondary | ICD-10-CM

## 2023-03-20 DIAGNOSIS — Z0000 Encounter for general adult medical examination without abnormal findings: Secondary | ICD-10-CM | POA: Diagnosis not present

## 2023-03-20 DIAGNOSIS — Z131 Encounter for screening for diabetes mellitus: Secondary | ICD-10-CM | POA: Diagnosis not present

## 2023-03-20 DIAGNOSIS — R5383 Other fatigue: Secondary | ICD-10-CM

## 2023-03-20 LAB — POCT GLYCOSYLATED HEMOGLOBIN (HGB A1C): Hemoglobin A1C: 6.1 % — AB (ref 4.0–5.6)

## 2023-03-20 MED ORDER — DULOXETINE HCL 60 MG PO CPEP: 60.0000 mg | ORAL_CAPSULE | Freq: Every day | ORAL | 1 refills | Status: DC

## 2023-03-20 MED ORDER — PROPRANOLOL HCL ER 120 MG PO CP24: 120.0000 mg | ORAL_CAPSULE | Freq: Every day | ORAL | 1 refills | Status: DC

## 2023-03-20 NOTE — Patient Instructions (Signed)
It was great to see you! Thank you for allowing me to participate in your care!   I recommend that you always bring your medications to each appointment as this makes it easy to ensure we are on the correct medications and helps [place] not miss when refills are needed.  Our plans for today:  - Follow-up in 3 months for anxiety - I have refilled your medications  We are checking some labs today, I will call you if they are abnormal will send you a MyChart message or a letter if they are normal.  If you do not hear about your labs in the next 2 weeks please let us know.  Take care and seek immediate care sooner if you develop any concerns. Please remember to show up 15 minutes before your scheduled appointment time!  Tiffany Kocher, DO [facility]
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
## 2023-03-21 LAB — LIPID PANEL
Chol/HDL Ratio: 2.9 ratio (ref 0.0–4.4)
Cholesterol, Total: 181 mg/dL (ref 100–199)
HDL: 63 mg/dL (ref >39)
LDL Chol Calc (NIH): 90 mg/dL (ref 0–99)
Triglycerides: 163 mg/dL — ABNORMAL HIGH (ref 0–149)
VLDL Cholesterol Cal: 28 mg/dL (ref 5–40)

## 2023-03-21 LAB — BASIC METABOLIC PANEL
BUN/Creatinine Ratio: 15 (ref 12–28)
BUN: 14 mg/dL (ref 8–27)
CO2: 28 mmol/L (ref 20–29)
Calcium: 9.5 mg/dL (ref 8.7–10.3)
Chloride: 98 mmol/L (ref 96–106)
Creatinine, Ser: 0.93 mg/dL (ref 0.57–1.00)
Glucose: 69 mg/dL — ABNORMAL LOW (ref 70–99)
Potassium: 3 mmol/L — ABNORMAL LOW (ref 3.5–5.2)
Sodium: 143 mmol/L (ref 134–144)
eGFR: 70 mL/min/{1.73_m2} (ref >59)

## 2023-03-21 LAB — CBC
Hematocrit: 38.4 % (ref 34.0–46.6)
Hemoglobin: 12.4 g/dL (ref 11.1–15.9)
MCH: 27.6 pg (ref 26.6–33.0)
MCHC: 32.3 g/dL (ref 31.5–35.7)
MCV: 85 fL (ref 79–97)
Platelets: 344 10*3/uL (ref 150–450)
RBC: 4.5 x10E6/uL (ref 3.77–5.28)
RDW: 13.1 % (ref 11.7–15.4)
WBC: 8 10*3/uL (ref 3.4–10.8)

## 2023-03-24 ENCOUNTER — Telehealth: Payer: Self-pay | Admitting: Student

## 2023-03-24 DIAGNOSIS — E876 Hypokalemia: Secondary | ICD-10-CM

## 2023-03-24 MED ORDER — POTASSIUM CHLORIDE CRYS ER 20 MEQ PO TBCR
20.0000 meq | EXTENDED_RELEASE_TABLET | Freq: Two times a day (BID) | ORAL | 0 refills | Status: DC
Start: 2023-03-24 — End: 2023-05-08

## 2023-03-24 NOTE — Telephone Encounter (Signed)
Called patient, confirmed identity.  Discussed finding of hypokalemia, potassium of 3.0.  She denies chest pain, palpitations, weakness, fatigue at this time.  She is currently taking hydrochlorothiazide daily for blood pressure.  Plan to discontinue hydrochlorothiazide at this time.  Sent prescription for potassium 20 mEq twice daily for 7 days.  Will route note to front staff to schedule appointment for Friday or first thing this upcoming Monday.  Recommend rechecking potassium and possibly obtaining EKG at next appointment.  If patient is hypertensive, consider potassium sparing blood pressure medications.

## 2023-05-08 ENCOUNTER — Ambulatory Visit (HOSPITAL_COMMUNITY)
Admission: EM | Admit: 2023-05-08 | Discharge: 2023-05-08 | Disposition: A | Discharge status: Home / Self Care | Payer: Federal, State, Local not specified - PPO | Attending: Emergency Medicine | Admitting: Emergency Medicine

## 2023-05-08 ENCOUNTER — Encounter (HOSPITAL_COMMUNITY): Payer: Self-pay

## 2023-05-08 DIAGNOSIS — U071 COVID-19: Secondary | ICD-10-CM | POA: Diagnosis not present

## 2023-05-08 LAB — POC COVID19/FLU A&B COMBO
Covid Antigen, POC: POSITIVE — AB
Influenza A Antigen, POC: NEGATIVE
Influenza B Antigen, POC: NEGATIVE

## 2023-05-08 MED ORDER — GUAIFENESIN ER 600 MG PO TB12: 1200.0000 mg | ORAL_TABLET | Freq: Two times a day (BID) | ORAL | 0 refills | Status: AC

## 2023-05-08 MED ORDER — IBUPROFEN 800 MG PO TABS: 800.0000 mg | ORAL_TABLET | Freq: Three times a day (TID) | ORAL | 0 refills | Status: AC

## 2023-05-08 MED ORDER — FLUTICASONE PROPIONATE 50 MCG/ACT NA SUSP: 1.0000 | Freq: Every day | NASAL | 2 refills | Status: AC

## 2023-05-08 MED ORDER — PROMETHAZINE-DM 6.25-15 MG/5ML PO SYRP: 5.0000 mL | ORAL_SOLUTION | Freq: Four times a day (QID) | ORAL | 0 refills | Status: AC | PRN

## 2023-05-08 NOTE — ED Provider Notes (Signed)
[facility]    CSN: [id number] Arrival date & time: 05/08/23  1100      History   Chief Complaint Chief Complaint  Patient presents with   Fall   Cough    HPI Ana Young is a 62 y.o. female.   Patient presents to clinic complaining of cough, nasal drainage, and congestion for the past 2 days.  She did do a home COVID test last night and it was positive.  She denies any wheezing or shortness of breath.  She feels like she 'cannot catch her wind' with coughing. She has been sweating, unsure of fever as she has not checked her temperature.  She also fell last night at work.  They were doing some work on the ceiling and she was alarmed so she stepped backwards and tripped over a hand jack.  She is having right lower back pain.  Denies any numbness or tingling.  No incontinence.  Was having pain and discomfort with sleeping last night and the area hurts when she bends or twist.   The history is provided by the patient and medical records.  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   Seasonal allergies    Vitamin D deficiency     Patient Active Problem List   Diagnosis Date Noted   Left elbow pain 07/21/2021   Peripheral neuropathy 07/18/2019   Hx of breast cancer 12/05/2014   Generalized anxiety disorder 06/15/2007   Depression 06/15/2007   Essential hypertension 06/15/2007   Migraine 09/03/2006    Past Surgical History:  Procedure Laterality Date   BREAST LUMPECTOMY  Left 2015   BUNIONECTOMY Left 2009   PARTIAL MASTECTOMY WITH NEEDLE LOCALIZATION AND AXILLARY SENTINEL LYMPH NODE BX Left 08/16/2013   Procedure: PARTIAL MASTECTOMY WITH NEEDLE LOCALIZATION AND AXILLARY SENTINEL LYMPH NODE BIOPSY AND PORT A CATH REMOVAL;  Surgeon: Almond Lint, MD;  Location: [facility];  Service: General;  Laterality: Left;   PORTACATH PLACEMENT Left 03/09/2013   Procedure: INSERTION PORT-A-CATH;  Surgeon: Almond Lint, MD;  Location: [facility];  Service: General;  Laterality: Left;    OB History   No obstetric history on file.      Home Medications    Prior to Admission medications   Medication Sig Start Date End Date Taking? Authorizing Provider  fluticasone (FLONASE) 50 MCG/ACT nasal spray Place 1 spray into both nostrils daily. 05/08/23  Yes Rinaldo Ratel, [place] N, FNP  guaiFENesin (MUCINEX) 600 MG 12 hr tablet Take 2 tablets (1,200 mg total) by mouth 2 (two) times daily for 5 days. 05/08/23 05/13/23 Yes Rinaldo Ratel, [place] N, FNP  ibuprofen (ADVIL) 800 MG tablet Take 1 tablet (800 mg total) by mouth 3 (three) times daily. 05/08/23  Yes Rinaldo Ratel, [place] N, FNP  promethazine-dextromethorphan (PROMETHAZINE-DM) 6.25-15 MG/5ML syrup Take 5 mLs by mouth 4 (four) times daily as needed for cough. 05/08/23  Yes Rinaldo Ratel, [place]  N, FNP  buPROPion (WELLBUTRIN XL) 300 MG 24 hr tablet TAKE 1 TABLET BY MOUTH EVERY DAY 07/21/22   Maury Dus, MD  busPIRone (BUSPAR) 5 MG tablet Take 1 tablet (5 mg total) by mouth 3 (three) times daily. 03/03/23   Tiffany Kocher, DO  DULoxetine (CYMBALTA) 60 MG capsule Take 1 capsule (60 mg total) by mouth daily. Please schedule office visit prior to any additional refills. 03/20/23   Tiffany Kocher, DO  gabapentin (NEURONTIN) 300 MG capsule Take 1 capsule (300 mg total) by mouth 2 (two) times daily. 01/14/23   Tiffany Kocher, DO  hydrochlorothiazide (HYDRODIURIL) 25 MG tablet TAKE 1 TABLET (25 MG TOTAL) BY MOUTH DAILY. 09/15/22    Maury Dus, MD  propranolol ER (INDERAL LA) 120 MG 24 hr capsule Take 1 capsule (120 mg total) by mouth daily. Please schedule office visit prior to any additional refills. 03/20/23   Tiffany Kocher, DO    Family History Family History  Problem Relation Age of Onset   Congestive Heart Failure Mother    Stroke Father    Diabetes Father    Breast cancer Maternal Grandmother 6   Heart attack Brother 73   Colon cancer Neg Hx     Social History Social History   Tobacco Use   Smoking status: Never   Smokeless tobacco: Never  Substance Use Topics   Alcohol use: No   Drug use: No     Allergies   Patient has no known allergies.   Review of Systems Review of Systems  Per HPI   Physical Exam Triage Vital Signs ED Triage Vitals  Encounter Vitals Group     BP 05/08/23 1131 136/76     Systolic BP Percentile --      Diastolic BP Percentile --      Pulse Rate 05/08/23 1131 92     Resp 05/08/23 1131 16     Temp 05/08/23 1131 99.2 F (37.3 C)     Temp Source 05/08/23 1131 Oral     SpO2 05/08/23 1131 96 %     Weight 05/08/23 1131 190 lb (86.2 kg)     Height 05/08/23 1131 5\' 7"  (1.702 m)     Head Circumference --      Peak Flow --      Pain Score 05/08/23 1130 8     Pain Loc --      Pain Education --      Exclude from Growth Chart --    No data found.  Updated Vital Signs BP 136/76 (BP Location: Right Arm)   Pulse 92   Temp 99.2 F (37.3 C) (Oral)   Resp 16   Ht 5\' 7"  (1.702 m)   Wt 190 lb (86.2 kg)   LMP 07/07/2013 Comment: stopped after chemo  SpO2 96%   BMI 29.76 kg/m   Visual Acuity Right Eye Distance:   Left Eye Distance:   Bilateral Distance:    Right Eye Near:   Left Eye Near:    Bilateral Near:     Physical Exam Vitals and nursing note reviewed.  Constitutional:      Appearance: Normal appearance.  HENT:     Head: Normocephalic and atraumatic.     Right Ear: External ear normal.     Left Ear: External ear normal.     Nose:  Congestion and rhinorrhea present.     Mouth/Throat:     Mouth: Mucous membranes are moist.     Pharynx: Posterior oropharyngeal erythema present.  Eyes:     Conjunctiva/sclera: Conjunctivae normal.  Cardiovascular:     Rate and Rhythm: Normal rate and regular rhythm.     Heart sounds: Normal heart sounds. No murmur heard. Pulmonary:     Effort: Pulmonary effort is normal. No respiratory distress.     Breath sounds: Normal breath sounds.  Musculoskeletal:        General: Tenderness and signs of injury present.     Cervical back: Normal.     Thoracic back: Normal.     Lumbar back: Tenderness present.       Back:     Comments: Right-sided lumbar muscular tenderness to palpation.  No bruising, deformity or crepitus.  Spine without step-off or deformity.  Without signs of cauda equina.  Skin:    General: Skin is warm and dry.  Neurological:     General: No focal deficit present.     Mental Status: She is alert.  Psychiatric:        Mood and Affect: Mood normal.        Behavior: Behavior is cooperative.      UC Treatments / Results  Labs (all labs ordered are listed, but only abnormal results are displayed) Labs Reviewed  POC COVID19/FLU A&B COMBO - Abnormal; Notable for the following components:      Result Value   Covid Antigen, POC Positive (*)    All other components within normal limits    EKG   Radiology No results found.  Procedures Procedures (including critical care time)  Medications Ordered in UC Medications - No data to display  Initial Impression / Assessment and Plan / UC Course  I have reviewed the triage vital signs and the nursing notes.  Pertinent labs & imaging results that were available during my care of the patient were reviewed by me and considered in my medical decision making (see chart for details).  Vitals and triage reviewed, patient is hemodynamically stable.  Heart with regular rate and rhythm, lungs are vesicular.  Has right-sided  lumbar muscular pain that is tender to palpation.  Exacerbated with movement.  Advised alternating between Tylenol and ibuprofen for body aches and musculoskeletal pain.  COVID-19 testing positive in clinic, provided with work note.  Symptomatic management for viral illness discussed.  Plan of care, follow-up care and return precautions given, no questions at this time.     Final Clinical Impressions(s) / UC Diagnoses   Final diagnoses:  COVID-19 virus infection     Discharge Instructions      Your COVID test was positive today in clinic.  Your flu testing was negative.  You can alternate between Tylenol and ibuprofen every 4-6 hours for any fever, body aches and chills.  Take the Mucinex to help break up your nasal congestion in addition to at least 64 ounces of water and sleeping with them humidifier, you can also use the Flonase.  Use the cough syrup as needed, do not drink drive on this medication as it may cause drowsiness.  Symptoms should improve over the next 3 to 5 days, if no improvement or any changes please return to clinic.     ED Prescriptions     Medication Sig Dispense Auth. Provider   promethazine-dextromethorphan (PROMETHAZINE-DM) 6.25-15 MG/5ML syrup Take 5 mLs by mouth 4 (four) times daily as needed for cough. 118 mL Rinaldo Ratel, [place] N, FNP   guaiFENesin (MUCINEX) 600 MG 12 hr tablet Take 2 tablets (1,200 mg total) by mouth 2 (two) times daily for  5 days. 20 tablet Rinaldo Ratel, [place] N, FNP   fluticasone [facility]) 50 MCG/ACT nasal spray Place 1 spray into both nostrils daily. 9.9 mL Rinaldo Ratel, [place] N, [place]   ibuprofen (ADVIL) 800 MG tablet Take 1 tablet (800 mg total) by mouth 3 (three) times daily. 21 tablet Leomia Blake, [place] N, [place]      PDMP not reviewed this encounter.   Hannah Strader, [place] N, [place] 05/08/23 [phone]

## 2023-05-08 NOTE — ED Triage Notes (Signed)
Patient here today with c/o cough, nasal drainage, SOB, and congestion X 2 days. Patient took a Covid test at home and was positive.   Patient also here today with c/o LB pain and soreness in both legs since yesterday after falling while at work. 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 She states that she was backing up when she tripped.

## 2023-05-08 NOTE — Discharge Instructions (Addendum)
Your COVID test was positive today in clinic.  Your flu testing was negative.  You can alternate between Tylenol and ibuprofen every 4-6 hours for any fever, body aches and chills.  Take the Mucinex to help break up your nasal congestion in addition to at least 64 ounces of water and sleeping with them humidifier, you can also use the Flonase.  Use the cough syrup as needed, do not drink drive on this medication as it may cause drowsiness.  Symptoms should improve over the next 3 to 5 days, if no improvement or any changes please return to clinic.

## 2023-05-18 ENCOUNTER — Encounter: Payer: Self-pay | Admitting: Student

## 2023-05-18 ENCOUNTER — Ambulatory Visit (INDEPENDENT_AMBULATORY_CARE_PROVIDER_SITE_OTHER): Payer: Federal, State, Local not specified - PPO | Admitting: Student

## 2023-05-18 VITALS — BP 137/89 | HR 74 | Ht 67.0 in | Wt 183.5 lb

## 2023-05-18 DIAGNOSIS — E876 Hypokalemia: Secondary | ICD-10-CM

## 2023-05-18 DIAGNOSIS — I10 Essential (primary) hypertension: Secondary | ICD-10-CM

## 2023-05-18 DIAGNOSIS — Z23 Encounter for immunization: Secondary | ICD-10-CM | POA: Diagnosis not present

## 2023-05-18 DIAGNOSIS — F411 Generalized anxiety disorder: Secondary | ICD-10-CM | POA: Diagnosis not present

## 2023-05-18 NOTE — Patient Instructions (Addendum)
It was great to see you! Thank you for allowing me to participate in your care!   I recommend that you always bring your medications to each appointment as this makes it easy to ensure we are on the correct medications and helps [place] not miss when refills are needed.  Our plans for today:  - Please schedule appointment for 2 weeks to recheck blood pressure.  We are checking some labs today, I will call you if they are abnormal will send you a MyChart message or a letter if they are normal.  If you do not hear about your labs in the next 2 weeks please let us know.  Take care and seek immediate care sooner if you develop any concerns. Please remember to show up 15 minutes before your scheduled appointment time!  Tiffany Kocher, DO [facility]

## 2023-05-18 NOTE — Progress Notes (Signed)
    SUBJECTIVE:   CHIEF COMPLAINT / HPI:   Anxiety follow-up FMLA paperwork was filled out after last visit.  She turned it in to work, but reports she has not been using her days off.  She has not seen therapy.  She understands that for FMLA to be renewed, the condition would be to see therapy.  She states that she is currently doing well with her anxiety, after starting BuSpar and is not interested nor does she feel she needs to see therapy at this time.  Patient was not able to list her medications from memory, however while reviewing together she reports that she is taking her Wellbutrin 300 mg daily, Cymbalta 60 mg daily as well as BuSpar 3 times daily.  HTN Patient had well-controlled blood pressure, however on BMP performed at prior visit she was noted to have hypokalemia.  Hydrochlorothiazide was discontinued and she received 7 days of potassium supplementation.  She was supposed to follow-up with our practice, however was lost to follow-up.  Today she has no symptoms.  Her blood pressure is elevated, but she is not taking any blood pressure medications.  OBJECTIVE:   BP 137/89   Pulse 74   Ht 5\' 7"  (1.702 m)   Wt 183 lb 8 oz (83.2 kg)   LMP 07/07/2013 Comment: stopped after chemo  SpO2 96%   BMI 28.74 kg/m    General: NAD, pleasant Cardio: RRR, no MRG. Cap Refill <2s. Respiratory: CTAB, normal wob on RA Skin: Warm and dry  ASSESSMENT/PLAN:   Assessment & Plan Generalized anxiety disorder Well-controlled.  Recommend seeing therapy if symptoms worsen.  Therapy is part of her FMLA renewal requirement, as she was receiving days off to see therapy and physician. - Continue BuSpar 5 mg 3 times daily, Cymbalta 60 mg daily, Wellbutrin 300 mg daily Hypokalemia Potassium 3.0 in September, stopped hydrochlorothiazide and given 7 days of potassium supplementation.  Lost to follow-up. - Repeat BMP today - Consider altering blood pressure medication pending results Essential  hypertension Repeat blood pressure 137/89, well-controlled.  Pending potassium levels, consider switching hydrochlorothiazide to low-dose amlodipine. - Follow-up in 2 weeks Encounter for immunization COVID vaccination today.   Tiffany Kocher, DO [facility]

## 2023-05-18 NOTE — Assessment & Plan Note (Signed)
Repeat blood pressure 137/89, well-controlled.  Pending potassium levels, consider switching hydrochlorothiazide to low-dose amlodipine. - Follow-up in 2 weeks

## 2023-05-18 NOTE — Assessment & Plan Note (Addendum)
Well-controlled.  Recommend seeing therapy if symptoms worsen.  Therapy is part of her FMLA renewal requirement, as she was receiving days off to see therapy and physician. - Continue BuSpar 5 mg 3 times daily, Cymbalta 60 mg daily, Wellbutrin 300 mg daily

## 2023-05-19 LAB — BASIC METABOLIC PANEL
BUN/Creatinine Ratio: 13 (ref 12–28)
BUN: 10 mg/dL (ref 8–27)
CO2: 24 mmol/L (ref 20–29)
Calcium: 9.3 mg/dL (ref 8.7–10.3)
Chloride: 104 mmol/L (ref 96–106)
Creatinine, Ser: 0.8 mg/dL (ref 0.57–1.00)
Glucose: 83 mg/dL (ref 70–99)
Potassium: 4.1 mmol/L (ref 3.5–5.2)
Sodium: 140 mmol/L (ref 134–144)
eGFR: 84 mL/min/{1.73_m2} (ref >59)

## 2023-05-21 ENCOUNTER — Telehealth: Payer: Self-pay | Admitting: Student

## 2023-05-21 DIAGNOSIS — I10 Essential (primary) hypertension: Secondary | ICD-10-CM

## 2023-05-21 MED ORDER — AMLODIPINE BESYLATE 5 MG PO TABS
5.0000 mg | ORAL_TABLET | Freq: Every day | ORAL | 3 refills | Status: DC
Start: 2023-05-21 — End: 2024-03-08

## 2023-05-21 NOTE — Telephone Encounter (Signed)
Called patient, confirmed the knee.  Discussed normal lab work.  Her potassium is back to normal.  Due to her low potassium on hydrochlorothiazide, I think it would be reasonable to switch her to amlodipine.  Her blood pressure is only minimally elevated above 140 systolic despite no HTN medication for several weeks.  She has appointment with me upcoming in December, I think this will be satisfactory for follow-up.  She is not having any symptoms of hypertension.  Prescription sent for 5 mg loaded pain daily.  Discontinue hydrochlorothiazide.  Follow-up December 3.

## 2023-05-24 ENCOUNTER — Other Ambulatory Visit: Payer: Self-pay | Admitting: Student

## 2023-06-09 ENCOUNTER — Ambulatory Visit: Payer: Self-pay | Admitting: Student

## 2023-07-27 ENCOUNTER — Other Ambulatory Visit: Payer: Self-pay | Admitting: Student

## 2023-07-27 DIAGNOSIS — G6289 Other specified polyneuropathies: Secondary | ICD-10-CM

## 2023-08-30 DIAGNOSIS — M545 Low back pain, unspecified: Secondary | ICD-10-CM | POA: Diagnosis not present

## 2023-08-30 DIAGNOSIS — R3915 Urgency of urination: Secondary | ICD-10-CM | POA: Diagnosis not present

## 2023-08-30 DIAGNOSIS — M79651 Pain in right thigh: Secondary | ICD-10-CM | POA: Diagnosis not present

## 2023-08-30 DIAGNOSIS — M79652 Pain in left thigh: Secondary | ICD-10-CM | POA: Diagnosis not present

## 2023-08-31 ENCOUNTER — Encounter (HOSPITAL_COMMUNITY): Payer: Self-pay

## 2023-08-31 ENCOUNTER — Emergency Department (HOSPITAL_COMMUNITY)
Admission: EM | Admit: 2023-08-31 | Discharge: 2023-09-01 | Disposition: A | Discharge status: Home / Self Care | Payer: Federal, State, Local not specified - PPO | Attending: Emergency Medicine | Admitting: Emergency Medicine

## 2023-08-31 ENCOUNTER — Ambulatory Visit (INDEPENDENT_AMBULATORY_CARE_PROVIDER_SITE_OTHER): Payer: Federal, State, Local not specified - PPO | Admitting: Student

## 2023-08-31 ENCOUNTER — Emergency Department (HOSPITAL_COMMUNITY): Payer: Federal, State, Local not specified - PPO

## 2023-08-31 ENCOUNTER — Other Ambulatory Visit: Payer: Self-pay

## 2023-08-31 ENCOUNTER — Encounter: Payer: Self-pay | Admitting: Student

## 2023-08-31 VITALS — BP 135/75 | HR 83 | Ht 67.0 in | Wt 184.4 lb

## 2023-08-31 DIAGNOSIS — M5126 Other intervertebral disc displacement, lumbar region: Secondary | ICD-10-CM | POA: Diagnosis not present

## 2023-08-31 DIAGNOSIS — M79651 Pain in right thigh: Secondary | ICD-10-CM | POA: Diagnosis not present

## 2023-08-31 DIAGNOSIS — M5441 Lumbago with sciatica, right side: Secondary | ICD-10-CM | POA: Insufficient documentation

## 2023-08-31 DIAGNOSIS — M5442 Lumbago with sciatica, left side: Secondary | ICD-10-CM | POA: Insufficient documentation

## 2023-08-31 DIAGNOSIS — E876 Hypokalemia: Secondary | ICD-10-CM | POA: Insufficient documentation

## 2023-08-31 DIAGNOSIS — M5127 Other intervertebral disc displacement, lumbosacral region: Secondary | ICD-10-CM | POA: Diagnosis not present

## 2023-08-31 DIAGNOSIS — M79652 Pain in left thigh: Secondary | ICD-10-CM

## 2023-08-31 DIAGNOSIS — M549 Dorsalgia, unspecified: Secondary | ICD-10-CM | POA: Diagnosis not present

## 2023-08-31 DIAGNOSIS — M545 Low back pain, unspecified: Secondary | ICD-10-CM | POA: Diagnosis not present

## 2023-08-31 DIAGNOSIS — M438X4 Other specified deforming dorsopathies, thoracic region: Secondary | ICD-10-CM | POA: Diagnosis not present

## 2023-08-31 LAB — CBC WITH DIFFERENTIAL/PLATELET
Abs Immature Granulocytes: 0.04 10*3/uL (ref 0.00–0.07)
Basophils Absolute: 0 10*3/uL (ref 0.0–0.1)
Basophils Relative: 0 %
Eosinophils Absolute: 0 10*3/uL (ref 0.0–0.5)
Eosinophils Relative: 0 %
HCT: 40.5 % (ref 36.0–46.0)
Hemoglobin: 13.2 g/dL (ref 12.0–15.0)
Immature Granulocytes: 0 %
Lymphocytes Relative: 23 %
Lymphs Abs: 2.3 10*3/uL (ref 0.7–4.0)
MCH: 27.3 pg (ref 26.0–34.0)
MCHC: 32.6 g/dL (ref 30.0–36.0)
MCV: 83.9 fL (ref 80.0–100.0)
Monocytes Absolute: 0.7 10*3/uL (ref 0.1–1.0)
Monocytes Relative: 6 %
Neutro Abs: 7.3 10*3/uL (ref 1.7–7.7)
Neutrophils Relative %: 71 %
Platelets: 358 10*3/uL (ref 150–400)
RBC: 4.83 MIL/uL (ref 3.87–5.11)
RDW: 14.2 % (ref 11.5–15.5)
WBC: 10.4 10*3/uL (ref 4.0–10.5)
nRBC: 0 % (ref 0.0–0.2)

## 2023-08-31 LAB — COMPREHENSIVE METABOLIC PANEL
ALT: 13 U/L (ref 0–44)
AST: 15 U/L (ref 15–41)
Albumin: 3.5 g/dL (ref 3.5–5.0)
Alkaline Phosphatase: 126 U/L (ref 38–126)
Anion gap: 16 — ABNORMAL HIGH (ref 5–15)
BUN: 10 mg/dL (ref 6–20)
CO2: 24 mmol/L (ref 22–32)
Calcium: 9.4 mg/dL (ref 8.9–10.3)
Chloride: 96 mmol/L — ABNORMAL LOW (ref 98–111)
Creatinine, Ser: 0.8 mg/dL (ref 0.44–1.00)
GFR, Estimated: >60 mL/min (ref >60)
Glucose, Bld: 103 mg/dL — ABNORMAL HIGH (ref 70–99)
Potassium: 2.6 mmol/L — CL (ref 3.5–5.1)
Sodium: 136 mmol/L (ref 135–145)
Total Bilirubin: 0.8 mg/dL (ref 0.0–1.2)
Total Protein: 7.9 g/dL (ref 6.5–8.1)

## 2023-08-31 LAB — I-STAT CHEM 8, ED
BUN: 10 mg/dL (ref 6–20)
Calcium, Ion: 1.09 mmol/L — ABNORMAL LOW (ref 1.15–1.40)
Chloride: 95 mmol/L — ABNORMAL LOW (ref 98–111)
Creatinine, Ser: 0.8 mg/dL (ref 0.44–1.00)
Glucose, Bld: 102 mg/dL — ABNORMAL HIGH (ref 70–99)
HCT: 42 % (ref 36.0–46.0)
Hemoglobin: 14.3 g/dL (ref 12.0–15.0)
Potassium: 2.6 mmol/L — CL (ref 3.5–5.1)
Sodium: 137 mmol/L (ref 135–145)
TCO2: 28 mmol/L (ref 22–32)

## 2023-08-31 MED ORDER — POTASSIUM CHLORIDE 10 MEQ/100ML IV SOLN
10.0000 meq | INTRAVENOUS | Status: AC
  Administered 2023-09-01 (×4): 10 meq via INTRAVENOUS
  Filled 2023-08-31 (×4): qty 100

## 2023-08-31 MED ORDER — POTASSIUM CHLORIDE CRYS ER 20 MEQ PO TBCR
40.0000 meq | EXTENDED_RELEASE_TABLET | Freq: Once | ORAL | Status: AC
  Administered 2023-09-01: 40 meq via ORAL
  Filled 2023-08-31: qty 2

## 2023-08-31 NOTE — ED Triage Notes (Signed)
 Pt bib ems from [facility] c/o lower back pain that has been going on for two weeks. Pt has had urinary incontinence. Pt has pain when she stands but sitting no pain.  BP 132/86 HR 84 RA 98%

## 2023-08-31 NOTE — ED Provider Triage Note (Signed)
 Emergency Medicine Provider Triage Evaluation Note  Ana Young , a 63 y.o. female  was evaluated in triage.  Pt complains of low back pain and lower extremity weakness x 2 weeks.  History of breast cancer.  Patient seen at family practice center and sent here for possible cord compression.  Review of Systems  Positive: Weakness Negative:  No Bladder bladder dysfunction  Physical Exam  BP 112/77 (BP Location: Left Arm)   Pulse 87   Temp 99.6 F (37.6 C) (Oral)   Resp 18   Ht 1.702 m (5\' 7" )   Wt 83.6 kg   LMP 07/07/2013 Comment: stopped after chemo  SpO2 99%   BMI 28.88 kg/m  Gen:   Awake, no distress   Resp:  Normal effort  MSK:   Moves extremities without difficulty  Other:    Medical Decision Making  Medically screening exam initiated at 4:07 PM.  Appropriate orders placed.  Ana Young was informed that the remainder of the evaluation will be completed by another provider, this initial triage assessment does not replace that evaluation, and the importance of remaining in the ED until their evaluation is complete.     Lorre Nick, MD 08/31/23 [phone]

## 2023-08-31 NOTE — Progress Notes (Signed)
    SUBJECTIVE:   CHIEF COMPLAINT / HPI:   Back Pain B/l Thigh Pain: - Last seen in Urgent Care -08/30/23  - Ongoing over last two weeks  - Burning in the thighs worsening with standing and rolling in the bed  - ambulates now with a walker but has never needed assistance   - LXR:  1. Trace grade 1 anterolisthesis at L4-L5. 2. No acute fracture. 3. Mild degenerative disc disease most notable at L4-L5. Mild mid and moderate lower lumbar facet arthropathy. 4. The SI joints are symmetric.   - Was referred to ortho at that visit  - Reports today burning in the thighs, requires walker now to ambulate  - The pain is located in the lower back and thighs b/l.   - H/o breast cancer  - currently without fever or chills  - a couple of episodes of inability to hold urine  - right knee has now started to give out on her    PERTINENT  PMH / PSH: HTN, Anxiety   OBJECTIVE:   BP 135/75   Pulse 83   Ht 5\' 7"  (1.702 m)   Wt 184 lb 6.4 oz (83.6 kg)   LMP 07/07/2013 Comment: stopped after chemo  SpO2 98%   BMI 28.88 kg/m   General: Alert and oriented in no apparent distress Heart: Regular rate and rhythm with no murmurs appreciated Lungs: Normal WOB Abdomen: Bowel sounds present, no abdominal pain Skin: Warm and dry Extremities: No lower extremity edema Back Exam:  Palpable tenderness: TTP over lumbar spine Strength at foot: Plantar-flexion: 5/5 Dorsi-flexion: 5/5 Eversion: 5/5 Inversion: 5/5  Sensory change: Gross sensation intact to all lumbar and sacral dermatomes. Gait- ambulate with walker, hesitant gait without foot drop noted    ASSESSMENT/PLAN:   Assessment & Plan Pain in both thighs 63 year old female with a history of breast cancer who presents with gait impairment and progressively worsening back and thigh pain.  Prior to 2 weeks ago, did not have any difficulty with ambulation nor did she have history of back pain.  Patient has gradually had worsening function of the  lower extremities with associated thigh burning and lower back pain.  Patient reports that her right knee has been starting to give out on her and now she is ambulating with a walker but had never previously needed assistant devices.  She also reports that she is unable to hold her urine intermittently.  She reports that when she needs to go to the bathroom, she is unable to hold and has had a few episodes of urinary incontinence.  No evidence of infection based on vital signs.  This presentation and significant worsening over small amount of time with history of cancer causes great concern for the possibility of spinal cord compression from unknown source.  Would strongly recommend that she receive imaging for further assessment.  Discussed with the patient that she needs to be seen in the ED.  Patient is amenable to this, called EMS to [facility] health for transfer.  Patient has daughter coming to the hospital to be with her. Patient also unable to drive 2/2 pain.      Alfredo Martinez, MD [facility]

## 2023-08-31 NOTE — Patient Instructions (Signed)
 It was great to see you today! Thank you for choosing [facility] for your primary care.  Today we addressed: We will be sending to the ED today  If you haven't already, sign up for My Chart to have easy access to your labs results, and communication with your primary care physician.  Please arrive 15 minutes before your appointment to ensure smooth check in process.  We appreciate your efforts in making this happen.  Thank you for allowing me to participate in your care, Alfredo Martinez, MD 08/31/2023, 2:29 PM PGY-3, [facility]

## 2023-09-01 MED ORDER — OXYCODONE-ACETAMINOPHEN 5-325 MG PO TABS: 1.0000 | ORAL_TABLET | Freq: Four times a day (QID) | ORAL | 0 refills | Status: AC | PRN

## 2023-09-01 MED ORDER — PREDNISONE 20 MG PO TABS: 40.0000 mg | ORAL_TABLET | Freq: Every day | ORAL | 0 refills | Status: AC

## 2023-09-01 MED ORDER — MAGNESIUM SULFATE 2 GM/50ML IV SOLN
2.0000 g | Freq: Once | INTRAVENOUS | Status: AC
  Administered 2023-09-01: 2 g via INTRAVENOUS
  Filled 2023-09-01: qty 50

## 2023-09-01 NOTE — ED Provider Notes (Signed)
 [facility] Emergency Department Provider Note MRN:  [id number]  Arrival date & time: 09/01/23     Chief Complaint   Back Pain and Bilateral Thigh Pain   History of Present Illness   Ana Young is a 63 y.o. year-old female presents to the ED with chief complaint of low back pain.  She has had worsening low back pain over the past 2 weeks.  She states that she has been using a walker because of pain, but not due to weakness.  She states that she has had a few episodes of urinating on herself, but this is because she is not able to make it to the bathroom in time.  She is able to feel when her bladder is full.  She states that occasionally her knee gives out.  She denies fevers or chills.  She denies any trauma.  She was sent from her doctor's office for further evaluation in the ED.  History provided by patient.   Review of Systems  Pertinent positive and negative review of systems noted in HPI.    Physical Exam   Vitals:   09/01/23 0350 09/01/23 0445  BP:  137/68  Pulse:  76  Resp:  18  Temp: 98.3 F (36.8 C)   SpO2:  95%    CONSTITUTIONAL:  non toxic-appearing, NAD NEURO:  Alert and oriented x 3, CN 3-12 grossly intact, normal lower extremity reflexes EYES:  eyes equal and reactive ENT/NECK:  Supple, no stridor  CARDIO:  normal rate, appears well-perfused  PULM:  No respiratory distress,  GI/GU:  non-distended,  MSK/SPINE:  No gross deformities, no edema, moves all extremities, normal strength of lower extremities, good plantar and dorsiflexion and great toe extension  SKIN:  no rash, atraumatic   *Additional and/or pertinent findings included in MDM below  Diagnostic and Interventional Summary    EKG Interpretation Date/Time:    Ventricular Rate:    PR Interval:    QRS Duration:    QT Interval:    QTC Calculation:   R Axis:      Text Interpretation:         Labs Reviewed  COMPREHENSIVE METABOLIC PANEL - Abnormal; Notable for  the following components:      Result Value   Potassium 2.6 (*)    Chloride 96 (*)    Glucose, Bld 103 (*)    Anion gap 16 (*)    All other components within normal limits  I-STAT CHEM 8, ED - Abnormal; Notable for the following components:   Potassium 2.6 (*)    Chloride 95 (*)    Glucose, Bld 102 (*)    Calcium, Ion 1.09 (*)    All other components within normal limits  CBC WITH DIFFERENTIAL/PLATELET    MR THORACIC SPINE WO CONTRAST  Final Result    MR LUMBAR SPINE WO CONTRAST  Final Result      Medications  potassium chloride SA (KLOR-CON M) CR tablet 40 mEq (40 mEq Oral Given 09/01/23 0048)  potassium chloride 10 mEq in 100 mL IVPB (0 mEq Intravenous Stopped 09/01/23 0449)  magnesium sulfate IVPB 2 g 50 mL (0 g Intravenous Stopped 09/01/23 0143)     Procedures  /  Critical Care Procedures  ED Course and Medical Decision Making  I have reviewed the triage vital signs, the nursing notes, and pertinent available records from the EMR.  Social Determinants Affecting Complexity of Care: Patient has no clinically significant social determinants affecting this chief  complaint..   ED Course:    Medical Decision Making Patient here with low back pain over the past 2 weeks.  She was sent from her doctor's office for MRIs of the spine.  When I talk with the patient, she states that she has been using a walker because of pain, not due to weakness.  She has good strength on my exam.  Normal reflexes.  She has not had true incontinence that would be concerning for cauda equina, but rather has not been able to make it to the bathroom because of pain and decreased mobility.  MRIs ordered in triage have resulted and show several bulged disks.  There is no central cord impingement.  I discussed the case with Dr. Madilyn Hook, who agrees with outpatient follow-up with spine.  Patient is not diabetic, I will start her on prednisone.  Will send a few pain pills.  Patient is noted to be quite  hypokalemic to 2.6.  I will give her some oral and IV potassium along with some magnesium.  I discussed treatment and follow-up plan with the patient.  She understands and agrees with the plan.  She is stable and ready for discharge.  Risk Prescription drug management.         Consultants: No consultations were needed in caring for this patient.   Treatment and Plan: I considered admission due to patient's initial presentation, but after considering the examination and diagnostic results, patient will not require admission and can be discharged with outpatient follow-up.    Final Clinical Impressions(s) / ED Diagnoses     ICD-10-CM   1. Acute midline low back pain with bilateral sciatica  M54.42    M54.41       ED Discharge Orders          Ordered    predniSONE (DELTASONE) 20 MG tablet  Daily        09/01/23 0522    oxyCODONE-acetaminophen (PERCOCET) 5-325 MG tablet  Every 6 hours PRN        09/01/23 0522              Discharge Instructions Discussed with and Provided to Patient:   Discharge Instructions   None      Roxy Horseman, PA-C 09/01/23 Luther Redo, MD 09/01/23 [phone]

## 2023-09-07 DIAGNOSIS — Z114 Encounter for screening for human immunodeficiency virus [HIV]: Secondary | ICD-10-CM | POA: Diagnosis not present

## 2023-09-07 DIAGNOSIS — F32A Depression, unspecified: Secondary | ICD-10-CM | POA: Diagnosis not present

## 2023-09-07 DIAGNOSIS — E876 Hypokalemia: Secondary | ICD-10-CM | POA: Diagnosis not present

## 2023-09-07 DIAGNOSIS — Z1159 Encounter for screening for other viral diseases: Secondary | ICD-10-CM | POA: Diagnosis not present

## 2023-09-07 DIAGNOSIS — Z1231 Encounter for screening mammogram for malignant neoplasm of breast: Secondary | ICD-10-CM | POA: Diagnosis not present

## 2023-09-07 DIAGNOSIS — Z7689 Persons encountering health services in other specified circumstances: Secondary | ICD-10-CM | POA: Diagnosis not present

## 2023-09-07 DIAGNOSIS — G629 Polyneuropathy, unspecified: Secondary | ICD-10-CM | POA: Diagnosis not present

## 2023-09-07 DIAGNOSIS — Z23 Encounter for immunization: Secondary | ICD-10-CM | POA: Diagnosis not present

## 2023-09-07 DIAGNOSIS — I10 Essential (primary) hypertension: Secondary | ICD-10-CM | POA: Diagnosis not present

## 2023-09-15 DIAGNOSIS — M47816 Spondylosis without myelopathy or radiculopathy, lumbar region: Secondary | ICD-10-CM | POA: Diagnosis not present

## 2023-09-15 DIAGNOSIS — Z6829 Body mass index (BMI) 29.0-29.9, adult: Secondary | ICD-10-CM | POA: Diagnosis not present

## 2023-09-18 DIAGNOSIS — E876 Hypokalemia: Secondary | ICD-10-CM | POA: Diagnosis not present

## 2023-09-28 DIAGNOSIS — H40023 Open angle with borderline findings, high risk, bilateral: Secondary | ICD-10-CM | POA: Diagnosis not present

## 2023-09-28 DIAGNOSIS — Z9841 Cataract extraction status, right eye: Secondary | ICD-10-CM | POA: Diagnosis not present

## 2023-09-28 DIAGNOSIS — Z9842 Cataract extraction status, left eye: Secondary | ICD-10-CM | POA: Diagnosis not present

## 2023-09-28 DIAGNOSIS — H524 Presbyopia: Secondary | ICD-10-CM | POA: Diagnosis not present

## 2023-09-28 DIAGNOSIS — H52223 Regular astigmatism, bilateral: Secondary | ICD-10-CM | POA: Diagnosis not present

## 2023-10-05 NOTE — Telephone Encounter (Signed)
 Marland Kitchen

## 2023-10-06 DIAGNOSIS — E876 Hypokalemia: Secondary | ICD-10-CM | POA: Diagnosis not present

## 2023-10-06 DIAGNOSIS — Z124 Encounter for screening for malignant neoplasm of cervix: Secondary | ICD-10-CM | POA: Diagnosis not present

## 2023-10-10 ENCOUNTER — Other Ambulatory Visit: Payer: Self-pay | Admitting: Student

## 2023-10-13 DIAGNOSIS — E876 Hypokalemia: Secondary | ICD-10-CM | POA: Diagnosis not present

## 2023-11-05 ENCOUNTER — Other Ambulatory Visit: Payer: Self-pay | Admitting: Student

## 2023-12-28 DIAGNOSIS — H401132 Primary open-angle glaucoma, bilateral, moderate stage: Secondary | ICD-10-CM | POA: Diagnosis not present

## 2024-01-21 ENCOUNTER — Other Ambulatory Visit: Payer: Self-pay | Admitting: Student

## 2024-01-21 DIAGNOSIS — G6289 Other specified polyneuropathies: Secondary | ICD-10-CM

## 2024-02-02 DIAGNOSIS — F32A Depression, unspecified: Secondary | ICD-10-CM | POA: Diagnosis not present

## 2024-03-08 ENCOUNTER — Other Ambulatory Visit: Payer: Self-pay | Admitting: Student

## 2024-03-08 DIAGNOSIS — I10 Essential (primary) hypertension: Secondary | ICD-10-CM

## 2024-03-09 ENCOUNTER — Other Ambulatory Visit: Payer: Self-pay | Admitting: Student

## 2024-03-21 DIAGNOSIS — Z0000 Encounter for general adult medical examination without abnormal findings: Secondary | ICD-10-CM | POA: Diagnosis not present

## 2024-03-21 DIAGNOSIS — R0789 Other chest pain: Secondary | ICD-10-CM | POA: Diagnosis not present

## 2024-03-21 DIAGNOSIS — F32A Depression, unspecified: Secondary | ICD-10-CM | POA: Diagnosis not present

## 2024-03-21 DIAGNOSIS — F411 Generalized anxiety disorder: Secondary | ICD-10-CM | POA: Diagnosis not present

## 2024-03-21 DIAGNOSIS — H6121 Impacted cerumen, right ear: Secondary | ICD-10-CM | POA: Diagnosis not present

## 2024-06-21 ENCOUNTER — Encounter (HOSPITAL_COMMUNITY): Payer: Self-pay

## 2024-06-21 ENCOUNTER — Emergency Department (HOSPITAL_COMMUNITY)

## 2024-06-21 ENCOUNTER — Emergency Department (HOSPITAL_COMMUNITY)
Admission: EM | Admit: 2024-06-21 | Discharge: 2024-06-21 | Disposition: A | Discharge status: Home / Self Care | Source: Ambulatory Visit | Attending: Emergency Medicine | Admitting: Emergency Medicine

## 2024-06-21 ENCOUNTER — Other Ambulatory Visit: Payer: Self-pay

## 2024-06-21 DIAGNOSIS — R0602 Shortness of breath: Secondary | ICD-10-CM | POA: Diagnosis not present

## 2024-06-21 DIAGNOSIS — R059 Cough, unspecified: Secondary | ICD-10-CM | POA: Diagnosis not present

## 2024-06-21 DIAGNOSIS — Z79899 Other long term (current) drug therapy: Secondary | ICD-10-CM | POA: Diagnosis not present

## 2024-06-21 DIAGNOSIS — J101 Influenza due to other identified influenza virus with other respiratory manifestations: Secondary | ICD-10-CM | POA: Diagnosis not present

## 2024-06-21 DIAGNOSIS — I959 Hypotension, unspecified: Secondary | ICD-10-CM | POA: Diagnosis not present

## 2024-06-21 DIAGNOSIS — I10 Essential (primary) hypertension: Secondary | ICD-10-CM | POA: Diagnosis not present

## 2024-06-21 DIAGNOSIS — R61 Generalized hyperhidrosis: Secondary | ICD-10-CM | POA: Diagnosis not present

## 2024-06-21 DIAGNOSIS — R55 Syncope and collapse: Secondary | ICD-10-CM | POA: Diagnosis not present

## 2024-06-21 DIAGNOSIS — R509 Fever, unspecified: Secondary | ICD-10-CM | POA: Diagnosis not present

## 2024-06-21 DIAGNOSIS — Z853 Personal history of malignant neoplasm of breast: Secondary | ICD-10-CM | POA: Diagnosis not present

## 2024-06-21 LAB — CBC WITH DIFFERENTIAL/PLATELET
Abs Immature Granulocytes: 0.03 K/uL (ref 0.00–0.07)
Basophils Absolute: 0 K/uL (ref 0.0–0.1)
Basophils Relative: 0 %
Eosinophils Absolute: 0 K/uL (ref 0.0–0.5)
Eosinophils Relative: 0 %
HCT: 42.7 % (ref 36.0–46.0)
Hemoglobin: 14 g/dL (ref 12.0–15.0)
Immature Granulocytes: 1 %
Lymphocytes Relative: 19 %
Lymphs Abs: 1 K/uL (ref 0.7–4.0)
MCH: 27.4 pg (ref 26.0–34.0)
MCHC: 32.8 g/dL (ref 30.0–36.0)
MCV: 83.6 fL (ref 80.0–100.0)
Monocytes Absolute: 0.5 K/uL (ref 0.1–1.0)
Monocytes Relative: 9 %
Neutro Abs: 3.8 K/uL (ref 1.7–7.7)
Neutrophils Relative %: 71 %
Platelets: 238 K/uL (ref 150–400)
RBC: 5.11 MIL/uL (ref 3.87–5.11)
RDW: 14.3 % (ref 11.5–15.5)
WBC: 5.3 K/uL (ref 4.0–10.5)
nRBC: 0 % (ref 0.0–0.2)

## 2024-06-21 LAB — COMPREHENSIVE METABOLIC PANEL WITH GFR
ALT: 24 U/L (ref 0–44)
AST: 43 U/L — ABNORMAL HIGH (ref 15–41)
Albumin: 3.9 g/dL (ref 3.5–5.0)
Alkaline Phosphatase: 181 U/L — ABNORMAL HIGH (ref 38–126)
Anion gap: 11 (ref 5–15)
BUN: 13 mg/dL (ref 8–23)
CO2: 26 mmol/L (ref 22–32)
Calcium: 8.9 mg/dL (ref 8.9–10.3)
Chloride: 100 mmol/L (ref 98–111)
Creatinine, Ser: 0.86 mg/dL (ref 0.44–1.00)
GFR, Estimated: >60 mL/min (ref >60)
Glucose, Bld: 100 mg/dL — ABNORMAL HIGH (ref 70–99)
Potassium: 3.4 mmol/L — ABNORMAL LOW (ref 3.5–5.1)
Sodium: 136 mmol/L (ref 135–145)
Total Bilirubin: 0.3 mg/dL (ref 0.0–1.2)
Total Protein: 7.5 g/dL (ref 6.5–8.1)

## 2024-06-21 LAB — RESP PANEL BY RT-PCR (RSV, FLU A&B, COVID)  RVPGX2
Influenza A by PCR: POSITIVE — AB
Influenza B by PCR: NEGATIVE
Resp Syncytial Virus by PCR: NEGATIVE
SARS Coronavirus 2 by RT PCR: NEGATIVE

## 2024-06-21 MED ORDER — SODIUM CHLORIDE 0.9 % IV BOLUS
1000.0000 mL | Freq: Once | INTRAVENOUS | Status: AC
  Administered 2024-06-21: 13:00:00 1000 mL via INTRAVENOUS

## 2024-06-21 MED ORDER — LACTATED RINGERS IV BOLUS
1000.0000 mL | Freq: Once | INTRAVENOUS | Status: AC
  Administered 2024-06-21: 14:00:00 1000 mL via INTRAVENOUS

## 2024-06-21 NOTE — ED Triage Notes (Signed)
 Patient BIB [facility] from urgent care. While at urgent care patient had a syncopal episode with vomiting. BP on arrival was 72/64. On and off fever and shortness of breath.   EMS 18G right AC Duoneb Started lactated ringers 

## 2024-06-21 NOTE — ED Provider Notes (Signed)
 [place] EMERGENCY DEPARTMENT AT [facility] Provider Note   CSN: [id number] Arrival date & time: 06/21/24  1216     Patient presents with: Fever   Ana Young is a 63 y.o. female.   Patient is a 63 year old female with a history of anemia, migraines, hypertension, prior breast cancer no longer receiving therapy who presented to urgent care today due to 2 days of general myalgias, malaise, fever, cough, congestion, anorexia.  She denies any diarrhea and until she got to urgent care had not had any emesis.  She reports yesterday she really did not eat anything but continued to take her blood pressure medicines and just laid in bed all day.  When she got to urgent care today she reported feeling like she needed to vomit and had an episode of emesis and syncopized.  Was found to be hypotensive which is now improving prior to therapy.  She denies any further nausea.  She has felt a little bit short of breath and reports now she is having a productive cough.  She denies any abdominal pain or chest pain.  On Friday she was at a graduation and is concerned she may have gotten something from the graduation.  She is not a smoker and does not use inhalers regularly   Fever      Prior to Admission medications  Medication Sig Start Date End Date Taking? Authorizing Provider  amLODipine  (NORVASC ) 5 MG tablet TAKE 1 TABLET BY MOUTH EVERYDAY AT BEDTIME 03/08/24   Howell Lunger, DO  buPROPion  (WELLBUTRIN  XL) 300 MG 24 hr tablet TAKE 1 TABLET BY MOUTH EVERY DAY 07/21/22   Malvina Ellen, MD  busPIRone  (BUSPAR ) 5 MG tablet TAKE 1 TABLET BY MOUTH THREE TIMES A DAY 03/10/24   Howell Lunger, DO  DULoxetine  (CYMBALTA ) 60 MG capsule TAKE 1 CAPSULE DAILY. PLEASE SCHEDULE OFFICE VISIT PRIOR TO ANY ADDITIONAL REFILLS. 03/08/24   Howell Lunger, DO  fluticasone  (FLONASE ) 50 MCG/ACT nasal spray Place 1 spray into both nostrils daily. 05/08/23   Dreama, [place]  N, FNP  gabapentin  (NEURONTIN ) 300 MG  capsule TAKE 1 CAPSULE BY MOUTH TWICE A DAY 01/21/24   Howell Lunger, DO  ibuprofen  (ADVIL ) 800 MG tablet Take 1 tablet (800 mg total) by mouth 3 (three) times daily. 05/08/23   Dreama, [place]  N, FNP  oxyCODONE -acetaminophen  (PERCOCET) 5-325 MG tablet Take 1-2 tablets by mouth every 6 (six) hours as needed. 09/01/23   Vicky Charleston, PA-C  predniSONE  (DELTASONE ) 20 MG tablet Take 2 tablets (40 mg total) by mouth daily. 09/01/23   Vicky Charleston, PA-C  promethazine -dextromethorphan (PROMETHAZINE -DM) 6.25-15 MG/5ML syrup Take 5 mLs by mouth 4 (four) times daily as needed for cough. 05/08/23   Dreama, [place]  N, FNP  propranolol  ER (INDERAL  LA) 120 MG 24 hr capsule TAKE 1 CAPSULE BY MOUTH DAILY. PLEASE SCHEDULE OFFICE VISIT PRIOR TO ANY MORE REFILLS. 03/08/24   Howell Lunger, DO    Allergies: Patient has no known allergies.    Review of Systems  Constitutional:  Positive for fever.    Updated Vital Signs BP 107/69   Pulse 70   Temp 98.9 F (37.2 C) (Oral)   Resp 20   Ht 5' 7 (1.702 m)   Wt 83.9 kg   LMP 07/07/2013 Comment: stopped after chemo  SpO2 100%   BMI 28.98 kg/m   Physical Exam Vitals and nursing note reviewed.  Constitutional:      General: She is not in acute distress.    Appearance:  She is well-developed.  HENT:     Head: Normocephalic and atraumatic.     Nose: Congestion present.     Mouth/Throat:     Mouth: Mucous membranes are dry.  Eyes:     Pupils: Pupils are equal, round, and reactive to light.  Cardiovascular:     Rate and Rhythm: Normal rate and regular rhythm.     Pulses: Normal pulses.     Heart sounds: Normal heart sounds. No murmur heard.    No friction rub.  Pulmonary:     Effort: Pulmonary effort is normal.     Breath sounds: Wheezing present. No rales.     Comments: Expiratory wheezes which clear with coughing Abdominal:     General: Bowel sounds are normal. There is no distension.     Palpations: Abdomen is soft.     Tenderness: There  is no abdominal tenderness. There is no right CVA tenderness, left CVA tenderness, guarding or rebound.  Musculoskeletal:        General: No tenderness. Normal range of motion.     Right lower leg: No edema.     Left lower leg: No edema.     Comments: No edema  Skin:    General: Skin is warm and dry.     Findings: No rash.  Neurological:     Mental Status: She is alert and oriented to person, place, and time. Mental status is at baseline.     Cranial Nerves: No cranial nerve deficit.  Psychiatric:        Behavior: Behavior normal.     (all labs ordered are listed, but only abnormal results are displayed) Labs Reviewed  RESP PANEL BY RT-PCR (RSV, FLU A&B, COVID)  RVPGX2 - Abnormal; Notable for the following components:      Result Value   Influenza A by PCR POSITIVE (*)    All other components within normal limits  COMPREHENSIVE METABOLIC PANEL WITH GFR - Abnormal; Notable for the following components:   Potassium 3.4 (*)    Glucose, Bld 100 (*)    AST 43 (*)    Alkaline Phosphatase 181 (*)    All other components within normal limits  CBC WITH DIFFERENTIAL/PLATELET  URINALYSIS, ROUTINE W REFLEX MICROSCOPIC    EKG: EKG Interpretation Date/Time:  Tuesday June 21 2024 12:32:55 EST Ventricular Rate:  68 PR Interval:  151 QRS Duration:  85 QT Interval:  416 QTC Calculation: 443 R Axis:   68  Text Interpretation: Sinus rhythm Probable left atrial enlargement No significant change since last tracing Confirmed by Doretha Folks ([id number]) on 06/21/2024 1:07:25 PM  Radiology: [facility] Chest Port 1 View Result Date: 06/21/2024 EXAM: 1 VIEW(S) XRAY OF THE CHEST 06/21/2024 01:34:00 PM COMPARISON: 12/01/2019 CLINICAL HISTORY: sob, flu like sx FINDINGS: LUNGS AND PLEURA: Lungs are clear. No pleural effusion. No pneumothorax. HEART AND MEDIASTINUM: Heart size and pulmonary vascularity are normal. Mediastinal contours appear intact. BONES AND SOFT TISSUES: Surgical clips in the left  axilla. Degenerative changes in the spine. IMPRESSION: 1. No acute cardiopulmonary process. Electronically signed by: Elsie Gravely MD 06/21/2024 01:53 PM EST RP Workstation: [id number]     Procedures   Medications Ordered in the ED  sodium chloride  0.9 % bolus 1,000 mL (0 mLs Intravenous Stopped 06/21/24 1349)  lactated ringers  bolus 1,000 mL (1,000 mLs Intravenous New Bag/Given 06/21/24 1334)  Medical Decision Making Amount and/or Complexity of Data Reviewed External Data Reviewed: notes. Labs: ordered. Decision-making details documented in ED Course. Radiology: ordered and independent interpretation performed. Decision-making details documented in ED Course. ECG/medicine tests: ordered and independent interpretation performed. Decision-making details documented in ED Course.   Pt with multiple medical problems and comorbidities and presenting today with a complaint that caries a high risk for morbidity and mortality.  Pt with symptoms consistent with influenza.  Normal exam here but is has a soft .  No signs of breathing difficulty  No signs of strep pharyngitis, otitis or abnormal abdominal findings.  Will give IV fluids, chest x-ray to ensure no other acute pathology such as pneumonia.  Also will check labs to ensure no AKI or electrolyte abnormality.  Will continue antipyretica and rest and fluids and return for any further problems.  I independently interpreted patient's labs and EKG.  EKG without acute findings today, CBC, CMP without acute findings, patient tested flu a positive.  I have independently visualized and interpreted pt's images today. Chest x-ray is within normal limits.  After IV fluids patient's blood pressure is improved.  Caution patient about taking her blood pressure medication tonight especially if she is not eating or drinking a lot.  Supportive care at this time and return precautions      Final diagnoses:  Influenza A     ED Discharge Orders     None          Doretha Folks, MD 06/21/24 1450

## 2024-06-21 NOTE — ED Provider Triage Note (Signed)
 Emergency Medicine Provider Triage Evaluation Note  Ana Young , a 63 y.o. female  was evaluated in triage.  Pt complains of a syncopal episode after vomting.  Pt has had a fever and cough.  Review of Systems  Positive: fever Negative: No injury  Physical Exam  BP (!) 88/53 (BP Location: Left Arm)   Pulse 68   Temp 98.8 F (37.1 C) (Oral)   Resp 20   LMP 07/07/2013 Comment: stopped after chemo  SpO2 100%  Gen:   Awake, no distress   Resp:  Normal effort  MSK:   Moves extremities without difficulty  Other:    Medical Decision Making  Medically screening exam initiated at 12:35 PM.  Appropriate orders placed.  Ana Young was informed that the remainder of the evaluation will be completed by another provider, this initial triage assessment does not replace that evaluation, and the importance of remaining in the ED until their evaluation is complete.     Flint Sonny POUR, PA-C 06/21/24 1236

## 2024-06-21 NOTE — Discharge Instructions (Addendum)
 All the blood work was reassuring.  Make sure you are getting plenty of rest taking Tylenol  as needed for body aches and pains and fever.  X-ray did not show any signs of pneumonia today.  If you start getting severe shortness of breath or your symptoms are not improving in a week's time follow-up with your doctor or return to the emergency room.

## 2024-07-20 ENCOUNTER — Other Ambulatory Visit: Payer: Self-pay | Admitting: Student

## 2024-07-20 DIAGNOSIS — G6289 Other specified polyneuropathies: Secondary | ICD-10-CM
